# Patient Record
Sex: Male | Born: 1935 | Race: White | Hispanic: No | Marital: Married | Smoking: Never smoker
Health system: Southern US, Community
[De-identification: ages and names within clinical notes are randomized; demographics above are authoritative.]

## PROBLEM LIST (undated history)

## (undated) DIAGNOSIS — M810 Age-related osteoporosis without current pathological fracture: Secondary | ICD-10-CM

## (undated) DIAGNOSIS — H409 Unspecified glaucoma: Secondary | ICD-10-CM

## (undated) DIAGNOSIS — N400 Enlarged prostate without lower urinary tract symptoms: Secondary | ICD-10-CM

## (undated) DIAGNOSIS — C4491 Basal cell carcinoma of skin, unspecified: Secondary | ICD-10-CM

## (undated) DIAGNOSIS — G20A1 Parkinson's disease without dyskinesia, without mention of fluctuations: Secondary | ICD-10-CM

## (undated) DIAGNOSIS — G20 Parkinson's disease: Secondary | ICD-10-CM

## (undated) HISTORY — PX: NO PAST SURGERIES: SHX2092

---

## 2006-02-23 ENCOUNTER — Ambulatory Visit: Payer: Self-pay | Admitting: Family Medicine

## 2006-02-25 ENCOUNTER — Ambulatory Visit: Payer: Self-pay | Admitting: Family Medicine

## 2006-10-01 ENCOUNTER — Ambulatory Visit: Payer: Self-pay | Admitting: Gastroenterology

## 2008-11-27 ENCOUNTER — Ambulatory Visit: Payer: Self-pay | Admitting: Family Medicine

## 2009-11-22 ENCOUNTER — Encounter: Payer: Self-pay | Admitting: Internal Medicine

## 2009-12-14 ENCOUNTER — Encounter: Payer: Self-pay | Admitting: Internal Medicine

## 2010-09-03 ENCOUNTER — Ambulatory Visit: Payer: Self-pay | Admitting: Gastroenterology

## 2011-04-04 ENCOUNTER — Ambulatory Visit: Payer: Self-pay | Admitting: Internal Medicine

## 2011-04-09 ENCOUNTER — Ambulatory Visit: Payer: Self-pay | Admitting: Family Medicine

## 2011-10-08 ENCOUNTER — Ambulatory Visit: Payer: Self-pay | Admitting: Internal Medicine

## 2011-10-08 LAB — APTT: Activated PTT: 33.8 secs (ref 23.6–35.9)

## 2011-10-08 LAB — CBC WITH DIFFERENTIAL/PLATELET
Basophil #: 0 10*3/uL (ref 0.0–0.1)
Eosinophil #: 0.2 10*3/uL (ref 0.0–0.7)
Eosinophil %: 3.3 %
HCT: 37.9 % — ABNORMAL LOW (ref 40.0–52.0)
Lymphocyte %: 18.2 %
MCH: 31.4 pg (ref 26.0–34.0)
MCV: 94 fL (ref 80–100)
Monocyte #: 0.6 x10 3/mm (ref 0.2–1.0)
Neutrophil %: 67.4 %
RBC: 4.04 10*6/uL — ABNORMAL LOW (ref 4.40–5.90)
WBC: 5.7 10*3/uL (ref 3.8–10.6)

## 2011-10-08 LAB — URINALYSIS, COMPLETE
Bilirubin,UR: NEGATIVE
Glucose,UR: NEGATIVE mg/dL (ref 0–75)
Nitrite: POSITIVE

## 2011-10-08 LAB — COMPREHENSIVE METABOLIC PANEL
Anion Gap: 5 — ABNORMAL LOW (ref 7–16)
Bilirubin,Total: 0.8 mg/dL (ref 0.2–1.0)
Co2: 32 mmol/L (ref 21–32)
EGFR (African American): >60
EGFR (Non-African Amer.): >60
Osmolality: 283 (ref 275–301)
SGOT(AST): 26 U/L (ref 15–37)
SGPT (ALT): 26 U/L
Total Protein: 7.1 g/dL (ref 6.4–8.2)

## 2011-10-08 LAB — PROTIME-INR
INR: 1
Prothrombin Time: 13.2 secs (ref 11.5–14.7)

## 2011-10-08 LAB — SEDIMENTATION RATE: Erythrocyte Sed Rate: 16 mm/hr (ref 0–20)

## 2011-10-10 LAB — URINE CULTURE

## 2013-08-18 ENCOUNTER — Ambulatory Visit: Payer: Self-pay | Admitting: Family Medicine

## 2014-01-02 DIAGNOSIS — N400 Enlarged prostate without lower urinary tract symptoms: Secondary | ICD-10-CM | POA: Insufficient documentation

## 2014-01-02 DIAGNOSIS — M810 Age-related osteoporosis without current pathological fracture: Secondary | ICD-10-CM | POA: Insufficient documentation

## 2014-01-02 DIAGNOSIS — I10 Essential (primary) hypertension: Secondary | ICD-10-CM | POA: Insufficient documentation

## 2014-01-02 DIAGNOSIS — E876 Hypokalemia: Secondary | ICD-10-CM | POA: Insufficient documentation

## 2014-06-29 DIAGNOSIS — I10 Essential (primary) hypertension: Secondary | ICD-10-CM | POA: Diagnosis not present

## 2014-06-29 DIAGNOSIS — M810 Age-related osteoporosis without current pathological fracture: Secondary | ICD-10-CM | POA: Diagnosis not present

## 2014-06-29 DIAGNOSIS — Z79899 Other long term (current) drug therapy: Secondary | ICD-10-CM | POA: Diagnosis not present

## 2014-07-06 DIAGNOSIS — I10 Essential (primary) hypertension: Secondary | ICD-10-CM | POA: Diagnosis not present

## 2014-07-06 DIAGNOSIS — Z79899 Other long term (current) drug therapy: Secondary | ICD-10-CM | POA: Diagnosis not present

## 2014-07-06 DIAGNOSIS — Z0000 Encounter for general adult medical examination without abnormal findings: Secondary | ICD-10-CM | POA: Diagnosis not present

## 2014-08-01 DIAGNOSIS — H4011X1 Primary open-angle glaucoma, mild stage: Secondary | ICD-10-CM | POA: Diagnosis not present

## 2014-08-08 DIAGNOSIS — I10 Essential (primary) hypertension: Secondary | ICD-10-CM | POA: Diagnosis not present

## 2014-08-11 DIAGNOSIS — Z85820 Personal history of malignant melanoma of skin: Secondary | ICD-10-CM | POA: Diagnosis not present

## 2014-08-11 DIAGNOSIS — C44612 Basal cell carcinoma of skin of right upper limb, including shoulder: Secondary | ICD-10-CM | POA: Diagnosis not present

## 2014-08-11 DIAGNOSIS — D485 Neoplasm of uncertain behavior of skin: Secondary | ICD-10-CM | POA: Diagnosis not present

## 2014-08-11 DIAGNOSIS — L821 Other seborrheic keratosis: Secondary | ICD-10-CM | POA: Diagnosis not present

## 2014-08-21 DIAGNOSIS — G20 Parkinson's disease: Secondary | ICD-10-CM | POA: Diagnosis not present

## 2014-09-20 DIAGNOSIS — C44612 Basal cell carcinoma of skin of right upper limb, including shoulder: Secondary | ICD-10-CM | POA: Diagnosis not present

## 2014-09-21 DIAGNOSIS — C44612 Basal cell carcinoma of skin of right upper limb, including shoulder: Secondary | ICD-10-CM | POA: Diagnosis not present

## 2014-11-17 ENCOUNTER — Ambulatory Visit
Admission: EM | Admit: 2014-11-17 | Discharge: 2014-11-17 | Disposition: A | Discharge status: Home / Self Care | Payer: Commercial Managed Care - HMO | Attending: Family Medicine | Admitting: Family Medicine

## 2014-11-17 DIAGNOSIS — Y939 Activity, unspecified: Secondary | ICD-10-CM | POA: Diagnosis not present

## 2014-11-17 DIAGNOSIS — H409 Unspecified glaucoma: Secondary | ICD-10-CM | POA: Insufficient documentation

## 2014-11-17 DIAGNOSIS — L03115 Cellulitis of right lower limb: Secondary | ICD-10-CM | POA: Diagnosis not present

## 2014-11-17 DIAGNOSIS — L97911 Non-pressure chronic ulcer of unspecified part of right lower leg limited to breakdown of skin: Secondary | ICD-10-CM | POA: Diagnosis not present

## 2014-11-17 DIAGNOSIS — W57XXXA Bitten or stung by nonvenomous insect and other nonvenomous arthropods, initial encounter: Secondary | ICD-10-CM | POA: Diagnosis not present

## 2014-11-17 DIAGNOSIS — M810 Age-related osteoporosis without current pathological fracture: Secondary | ICD-10-CM | POA: Insufficient documentation

## 2014-11-17 DIAGNOSIS — N400 Enlarged prostate without lower urinary tract symptoms: Secondary | ICD-10-CM | POA: Diagnosis not present

## 2014-11-17 DIAGNOSIS — Z85828 Personal history of other malignant neoplasm of skin: Secondary | ICD-10-CM | POA: Insufficient documentation

## 2014-11-17 DIAGNOSIS — Z79899 Other long term (current) drug therapy: Secondary | ICD-10-CM | POA: Insufficient documentation

## 2014-11-17 DIAGNOSIS — S80861A Insect bite (nonvenomous), right lower leg, initial encounter: Secondary | ICD-10-CM | POA: Insufficient documentation

## 2014-11-17 HISTORY — DX: Unspecified glaucoma: H40.9

## 2014-11-17 HISTORY — DX: Age-related osteoporosis without current pathological fracture: M81.0

## 2014-11-17 HISTORY — DX: Basal cell carcinoma of skin, unspecified: C44.91

## 2014-11-17 HISTORY — DX: Benign prostatic hyperplasia without lower urinary tract symptoms: N40.0

## 2014-11-17 MED ORDER — SULFAMETHOXAZOLE-TRIMETHOPRIM 800-160 MG PO TABS: 1.0000 | ORAL_TABLET | Freq: Two times a day (BID) | ORAL | Status: DC

## 2014-11-17 NOTE — Discharge Instructions (Signed)

## 2014-11-17 NOTE — ED Notes (Signed)
One week ago thinks he had an insect bite.  Area was blistered. Patient has dressing over area and feels that it is infected

## 2014-11-17 NOTE — ED Provider Notes (Signed)
CSN: [id number]     Arrival date & time 11/17/14  1121 History   First MD Initiated Contact with Patient 11/17/14 1247     Chief Complaint  Patient presents with  . Insect Bite   (Consider location/radiation/quality/duration/timing/severity/associated sxs/prior Treatment) HPI Comments: 81 yo male with a 1 week h/o "insect bite" which "blistered" and drained. Since then has noticed a mild ulcer forming and slight drainage. Has been putting otc antibiotic ointment daily and bandage. Denies any fevers, chills, or pain.   The history is provided by the patient.    Past Medical History  Diagnosis Date  . Glaucoma (increased eye pressure)   . Prostate enlargement   . Osteoporosis   . Basal cell carcinoma    No past surgical history on file. No family history on file. History  Substance Use Topics  . Smoking status: Never Smoker   . Smokeless tobacco: Not on file  . Alcohol Use: No    Review of Systems  Allergies  Codeine  Home Medications   Prior to Admission medications   Medication Sig Start Date End Date Taking? Authorizing Provider  alendronate (FOSAMAX) 10 MG tablet Take 10 mg by mouth daily before breakfast. Take with a full glass of water on an empty stomach.   Yes Historical Provider, MD  carbidopa-levodopa (SINEMET IR) 25-100 MG per tablet Take 1 tablet by mouth 3 (three) times daily.   Yes Historical Provider, MD  finasteride (PROPECIA) 1 MG tablet Take 1 mg by mouth daily.   Yes Historical Provider, MD  latanoprost (XALATAN) 0.005 % ophthalmic solution 1 drop at bedtime.   Yes Historical Provider, MD  sulfamethoxazole-trimethoprim (BACTRIM DS,SEPTRA DS) 800-160 MG per tablet Take 1 tablet by mouth 2 (two) times daily. 11/17/14   Norval Gable, MD   BP 143/69 mmHg  Pulse 58  Temp(Src) 98.2 F (36.8 C) (Oral)  Resp 16  Ht 5\' 10"  (1.778 m)  Wt 165 lb (74.844 kg)  BMI 23.68 kg/m2  SpO2 98% Physical Exam  Constitutional: He appears well-developed and well-nourished.  No distress.  Skin: Skin is warm. He is not diaphoretic. There is erythema.     3cm superficial mildly draining skin ulceration to back of right lower leg (calf area) with surrounding warmth and blanchable erythema   Vitals reviewed.   ED Course  Procedures (including critical care time) Labs Review Labs Reviewed  CULTURE, ROUTINE-ABSCESS    Imaging Review No results found.   MDM   1. Skin ulcer of right lower leg, limited to breakdown of skin   2. Cellulitis of right lower extremity    New Prescriptions   SULFAMETHOXAZOLE-TRIMETHOPRIM (BACTRIM DS,SEPTRA DS) 800-160 MG PER TABLET    Take 1 tablet by mouth 2 (two) times daily.  Plan: 1. Diagnosis reviewed with patient 2. rx as per orders; risks, benefits, potential side effects reviewed with patient 3. Recommend supportive treatment with rest, elevation 4. F/u prn if symptoms worsen or don't improve    Norval Gable, MD 11/17/14 1320

## 2014-11-21 LAB — CULTURE, ROUTINE-ABSCESS: Special Requests: NORMAL

## 2015-01-04 DIAGNOSIS — Z79899 Other long term (current) drug therapy: Secondary | ICD-10-CM | POA: Diagnosis not present

## 2015-01-04 DIAGNOSIS — I10 Essential (primary) hypertension: Secondary | ICD-10-CM | POA: Diagnosis not present

## 2015-01-11 DIAGNOSIS — N401 Enlarged prostate with lower urinary tract symptoms: Secondary | ICD-10-CM | POA: Diagnosis not present

## 2015-01-11 DIAGNOSIS — G20 Parkinson's disease: Secondary | ICD-10-CM | POA: Diagnosis not present

## 2015-01-11 DIAGNOSIS — I10 Essential (primary) hypertension: Secondary | ICD-10-CM | POA: Diagnosis not present

## 2015-01-11 DIAGNOSIS — M810 Age-related osteoporosis without current pathological fracture: Secondary | ICD-10-CM | POA: Diagnosis not present

## 2015-02-09 ENCOUNTER — Other Ambulatory Visit: Payer: Self-pay | Admitting: Family Medicine

## 2015-02-09 DIAGNOSIS — R1011 Right upper quadrant pain: Secondary | ICD-10-CM | POA: Diagnosis not present

## 2015-02-09 DIAGNOSIS — Z85828 Personal history of other malignant neoplasm of skin: Secondary | ICD-10-CM | POA: Diagnosis not present

## 2015-02-09 DIAGNOSIS — Z85820 Personal history of malignant melanoma of skin: Secondary | ICD-10-CM | POA: Diagnosis not present

## 2015-02-09 DIAGNOSIS — L821 Other seborrheic keratosis: Secondary | ICD-10-CM | POA: Diagnosis not present

## 2015-02-09 DIAGNOSIS — D1801 Hemangioma of skin and subcutaneous tissue: Secondary | ICD-10-CM | POA: Diagnosis not present

## 2015-02-12 ENCOUNTER — Ambulatory Visit
Admission: RE | Admit: 2015-02-12 | Discharge: 2015-02-12 | Disposition: A | Discharge status: Home / Self Care | Payer: Commercial Managed Care - HMO | Source: Ambulatory Visit | Attending: Family Medicine | Admitting: Family Medicine

## 2015-02-12 DIAGNOSIS — R1011 Right upper quadrant pain: Secondary | ICD-10-CM

## 2015-04-20 DIAGNOSIS — H401131 Primary open-angle glaucoma, bilateral, mild stage: Secondary | ICD-10-CM | POA: Diagnosis not present

## 2015-05-29 DIAGNOSIS — B351 Tinea unguium: Secondary | ICD-10-CM | POA: Diagnosis not present

## 2015-05-29 DIAGNOSIS — L97521 Non-pressure chronic ulcer of other part of left foot limited to breakdown of skin: Secondary | ICD-10-CM | POA: Diagnosis not present

## 2015-05-29 DIAGNOSIS — M79672 Pain in left foot: Secondary | ICD-10-CM | POA: Diagnosis not present

## 2015-06-13 DIAGNOSIS — L97521 Non-pressure chronic ulcer of other part of left foot limited to breakdown of skin: Secondary | ICD-10-CM | POA: Diagnosis not present

## 2015-07-16 DIAGNOSIS — I10 Essential (primary) hypertension: Secondary | ICD-10-CM | POA: Diagnosis not present

## 2015-07-16 DIAGNOSIS — Z79899 Other long term (current) drug therapy: Secondary | ICD-10-CM | POA: Diagnosis not present

## 2015-07-18 DIAGNOSIS — L97521 Non-pressure chronic ulcer of other part of left foot limited to breakdown of skin: Secondary | ICD-10-CM | POA: Diagnosis not present

## 2015-07-23 DIAGNOSIS — Z79899 Other long term (current) drug therapy: Secondary | ICD-10-CM | POA: Diagnosis not present

## 2015-07-23 DIAGNOSIS — Z0000 Encounter for general adult medical examination without abnormal findings: Secondary | ICD-10-CM | POA: Diagnosis not present

## 2015-07-23 DIAGNOSIS — I10 Essential (primary) hypertension: Secondary | ICD-10-CM | POA: Diagnosis not present

## 2015-08-10 DIAGNOSIS — Z85820 Personal history of malignant melanoma of skin: Secondary | ICD-10-CM | POA: Diagnosis not present

## 2015-08-10 DIAGNOSIS — Z85828 Personal history of other malignant neoplasm of skin: Secondary | ICD-10-CM | POA: Diagnosis not present

## 2015-08-10 DIAGNOSIS — L570 Actinic keratosis: Secondary | ICD-10-CM | POA: Diagnosis not present

## 2015-08-10 DIAGNOSIS — R600 Localized edema: Secondary | ICD-10-CM | POA: Diagnosis not present

## 2015-08-10 DIAGNOSIS — X32XXXA Exposure to sunlight, initial encounter: Secondary | ICD-10-CM | POA: Diagnosis not present

## 2015-08-10 DIAGNOSIS — L821 Other seborrheic keratosis: Secondary | ICD-10-CM | POA: Diagnosis not present

## 2015-08-15 DIAGNOSIS — M79672 Pain in left foot: Secondary | ICD-10-CM | POA: Diagnosis not present

## 2015-08-15 DIAGNOSIS — B351 Tinea unguium: Secondary | ICD-10-CM | POA: Diagnosis not present

## 2015-08-15 DIAGNOSIS — L97521 Non-pressure chronic ulcer of other part of left foot limited to breakdown of skin: Secondary | ICD-10-CM | POA: Diagnosis not present

## 2015-08-21 DIAGNOSIS — F0280 Dementia in other diseases classified elsewhere without behavioral disturbance: Secondary | ICD-10-CM | POA: Diagnosis not present

## 2015-08-21 DIAGNOSIS — G20 Parkinson's disease: Secondary | ICD-10-CM | POA: Diagnosis not present

## 2015-09-03 DIAGNOSIS — F0280 Dementia in other diseases classified elsewhere without behavioral disturbance: Secondary | ICD-10-CM | POA: Insufficient documentation

## 2015-10-17 DIAGNOSIS — B351 Tinea unguium: Secondary | ICD-10-CM | POA: Diagnosis not present

## 2015-10-17 DIAGNOSIS — M79672 Pain in left foot: Secondary | ICD-10-CM | POA: Diagnosis not present

## 2015-10-17 DIAGNOSIS — H401131 Primary open-angle glaucoma, bilateral, mild stage: Secondary | ICD-10-CM | POA: Diagnosis not present

## 2015-10-17 DIAGNOSIS — L97521 Non-pressure chronic ulcer of other part of left foot limited to breakdown of skin: Secondary | ICD-10-CM | POA: Diagnosis not present

## 2015-10-19 DIAGNOSIS — H401131 Primary open-angle glaucoma, bilateral, mild stage: Secondary | ICD-10-CM | POA: Diagnosis not present

## 2015-10-29 DIAGNOSIS — R55 Syncope and collapse: Secondary | ICD-10-CM | POA: Diagnosis not present

## 2015-11-02 DIAGNOSIS — I499 Cardiac arrhythmia, unspecified: Secondary | ICD-10-CM | POA: Diagnosis not present

## 2015-11-02 DIAGNOSIS — Z79899 Other long term (current) drug therapy: Secondary | ICD-10-CM | POA: Diagnosis not present

## 2015-11-02 DIAGNOSIS — L02419 Cutaneous abscess of limb, unspecified: Secondary | ICD-10-CM | POA: Diagnosis not present

## 2015-11-02 DIAGNOSIS — L03119 Cellulitis of unspecified part of limb: Secondary | ICD-10-CM | POA: Diagnosis not present

## 2015-11-23 DIAGNOSIS — Z79899 Other long term (current) drug therapy: Secondary | ICD-10-CM | POA: Diagnosis not present

## 2015-11-30 DIAGNOSIS — R600 Localized edema: Secondary | ICD-10-CM | POA: Diagnosis not present

## 2015-11-30 DIAGNOSIS — Z79899 Other long term (current) drug therapy: Secondary | ICD-10-CM | POA: Diagnosis not present

## 2015-12-10 DIAGNOSIS — M79672 Pain in left foot: Secondary | ICD-10-CM | POA: Diagnosis not present

## 2015-12-10 DIAGNOSIS — L97521 Non-pressure chronic ulcer of other part of left foot limited to breakdown of skin: Secondary | ICD-10-CM | POA: Diagnosis not present

## 2016-01-14 DIAGNOSIS — I10 Essential (primary) hypertension: Secondary | ICD-10-CM | POA: Diagnosis not present

## 2016-01-14 DIAGNOSIS — Z79899 Other long term (current) drug therapy: Secondary | ICD-10-CM | POA: Diagnosis not present

## 2016-01-21 DIAGNOSIS — M542 Cervicalgia: Secondary | ICD-10-CM | POA: Diagnosis not present

## 2016-01-21 DIAGNOSIS — D649 Anemia, unspecified: Secondary | ICD-10-CM | POA: Diagnosis not present

## 2016-01-21 DIAGNOSIS — G20 Parkinson's disease: Secondary | ICD-10-CM | POA: Diagnosis not present

## 2016-01-21 DIAGNOSIS — I10 Essential (primary) hypertension: Secondary | ICD-10-CM | POA: Diagnosis not present

## 2016-01-21 DIAGNOSIS — Z79899 Other long term (current) drug therapy: Secondary | ICD-10-CM | POA: Diagnosis not present

## 2016-02-21 DIAGNOSIS — G20 Parkinson's disease: Secondary | ICD-10-CM | POA: Diagnosis not present

## 2016-02-21 DIAGNOSIS — F0280 Dementia in other diseases classified elsewhere without behavioral disturbance: Secondary | ICD-10-CM | POA: Diagnosis not present

## 2016-02-29 DIAGNOSIS — Z9181 History of falling: Secondary | ICD-10-CM | POA: Diagnosis not present

## 2016-02-29 DIAGNOSIS — R2681 Unsteadiness on feet: Secondary | ICD-10-CM | POA: Diagnosis not present

## 2016-02-29 DIAGNOSIS — R293 Abnormal posture: Secondary | ICD-10-CM | POA: Diagnosis not present

## 2016-02-29 DIAGNOSIS — G20 Parkinson's disease: Secondary | ICD-10-CM | POA: Diagnosis not present

## 2016-03-04 DIAGNOSIS — Z9181 History of falling: Secondary | ICD-10-CM | POA: Diagnosis not present

## 2016-03-04 DIAGNOSIS — G20 Parkinson's disease: Secondary | ICD-10-CM | POA: Diagnosis not present

## 2016-03-04 DIAGNOSIS — R2681 Unsteadiness on feet: Secondary | ICD-10-CM | POA: Diagnosis not present

## 2016-03-04 DIAGNOSIS — R293 Abnormal posture: Secondary | ICD-10-CM | POA: Diagnosis not present

## 2016-03-13 DIAGNOSIS — R293 Abnormal posture: Secondary | ICD-10-CM | POA: Diagnosis not present

## 2016-03-13 DIAGNOSIS — Z9181 History of falling: Secondary | ICD-10-CM | POA: Diagnosis not present

## 2016-03-13 DIAGNOSIS — G20 Parkinson's disease: Secondary | ICD-10-CM | POA: Diagnosis not present

## 2016-03-13 DIAGNOSIS — R2681 Unsteadiness on feet: Secondary | ICD-10-CM | POA: Diagnosis not present

## 2016-03-27 DIAGNOSIS — G20 Parkinson's disease: Secondary | ICD-10-CM | POA: Diagnosis not present

## 2016-03-27 DIAGNOSIS — R293 Abnormal posture: Secondary | ICD-10-CM | POA: Diagnosis not present

## 2016-03-27 DIAGNOSIS — R2681 Unsteadiness on feet: Secondary | ICD-10-CM | POA: Diagnosis not present

## 2016-03-27 DIAGNOSIS — Z9181 History of falling: Secondary | ICD-10-CM | POA: Diagnosis not present

## 2016-04-04 DIAGNOSIS — M79672 Pain in left foot: Secondary | ICD-10-CM | POA: Diagnosis not present

## 2016-04-04 DIAGNOSIS — L97521 Non-pressure chronic ulcer of other part of left foot limited to breakdown of skin: Secondary | ICD-10-CM | POA: Diagnosis not present

## 2016-04-10 DIAGNOSIS — R293 Abnormal posture: Secondary | ICD-10-CM | POA: Diagnosis not present

## 2016-04-10 DIAGNOSIS — Z9181 History of falling: Secondary | ICD-10-CM | POA: Diagnosis not present

## 2016-04-10 DIAGNOSIS — G20 Parkinson's disease: Secondary | ICD-10-CM | POA: Diagnosis not present

## 2016-04-10 DIAGNOSIS — R2681 Unsteadiness on feet: Secondary | ICD-10-CM | POA: Diagnosis not present

## 2016-04-14 DIAGNOSIS — Z9181 History of falling: Secondary | ICD-10-CM | POA: Diagnosis not present

## 2016-04-14 DIAGNOSIS — R293 Abnormal posture: Secondary | ICD-10-CM | POA: Diagnosis not present

## 2016-04-14 DIAGNOSIS — G20 Parkinson's disease: Secondary | ICD-10-CM | POA: Diagnosis not present

## 2016-04-14 DIAGNOSIS — R2681 Unsteadiness on feet: Secondary | ICD-10-CM | POA: Diagnosis not present

## 2016-04-17 DIAGNOSIS — X32XXXA Exposure to sunlight, initial encounter: Secondary | ICD-10-CM | POA: Diagnosis not present

## 2016-04-17 DIAGNOSIS — S90121A Contusion of right lesser toe(s) without damage to nail, initial encounter: Secondary | ICD-10-CM | POA: Diagnosis not present

## 2016-04-17 DIAGNOSIS — L821 Other seborrheic keratosis: Secondary | ICD-10-CM | POA: Diagnosis not present

## 2016-04-17 DIAGNOSIS — Z85828 Personal history of other malignant neoplasm of skin: Secondary | ICD-10-CM | POA: Diagnosis not present

## 2016-04-17 DIAGNOSIS — Z85820 Personal history of malignant melanoma of skin: Secondary | ICD-10-CM | POA: Diagnosis not present

## 2016-04-17 DIAGNOSIS — L570 Actinic keratosis: Secondary | ICD-10-CM | POA: Diagnosis not present

## 2016-05-05 DIAGNOSIS — R293 Abnormal posture: Secondary | ICD-10-CM | POA: Diagnosis not present

## 2016-05-05 DIAGNOSIS — Z9181 History of falling: Secondary | ICD-10-CM | POA: Diagnosis not present

## 2016-05-05 DIAGNOSIS — R2681 Unsteadiness on feet: Secondary | ICD-10-CM | POA: Diagnosis not present

## 2016-05-05 DIAGNOSIS — G20 Parkinson's disease: Secondary | ICD-10-CM | POA: Diagnosis not present

## 2016-05-28 DIAGNOSIS — H401131 Primary open-angle glaucoma, bilateral, mild stage: Secondary | ICD-10-CM | POA: Diagnosis not present

## 2016-06-28 DIAGNOSIS — R05 Cough: Secondary | ICD-10-CM | POA: Diagnosis not present

## 2016-06-30 DIAGNOSIS — J181 Lobar pneumonia, unspecified organism: Secondary | ICD-10-CM | POA: Diagnosis not present

## 2016-07-17 DIAGNOSIS — Z79899 Other long term (current) drug therapy: Secondary | ICD-10-CM | POA: Diagnosis not present

## 2016-07-17 DIAGNOSIS — D649 Anemia, unspecified: Secondary | ICD-10-CM | POA: Diagnosis not present

## 2016-07-24 DIAGNOSIS — Z0000 Encounter for general adult medical examination without abnormal findings: Secondary | ICD-10-CM | POA: Diagnosis not present

## 2016-08-20 DIAGNOSIS — G20 Parkinson's disease: Secondary | ICD-10-CM | POA: Diagnosis not present

## 2016-08-20 DIAGNOSIS — F0280 Dementia in other diseases classified elsewhere without behavioral disturbance: Secondary | ICD-10-CM | POA: Diagnosis not present

## 2016-10-29 DIAGNOSIS — H401131 Primary open-angle glaucoma, bilateral, mild stage: Secondary | ICD-10-CM | POA: Diagnosis not present

## 2016-11-05 DIAGNOSIS — H401131 Primary open-angle glaucoma, bilateral, mild stage: Secondary | ICD-10-CM | POA: Diagnosis not present

## 2017-01-16 DIAGNOSIS — Z79899 Other long term (current) drug therapy: Secondary | ICD-10-CM | POA: Diagnosis not present

## 2017-01-23 DIAGNOSIS — D649 Anemia, unspecified: Secondary | ICD-10-CM | POA: Diagnosis not present

## 2017-01-23 DIAGNOSIS — G20 Parkinson's disease: Secondary | ICD-10-CM | POA: Diagnosis not present

## 2017-01-23 DIAGNOSIS — Z79899 Other long term (current) drug therapy: Secondary | ICD-10-CM | POA: Diagnosis not present

## 2017-01-23 DIAGNOSIS — I10 Essential (primary) hypertension: Secondary | ICD-10-CM | POA: Diagnosis not present

## 2017-01-23 DIAGNOSIS — R2681 Unsteadiness on feet: Secondary | ICD-10-CM | POA: Diagnosis not present

## 2017-01-23 DIAGNOSIS — R351 Nocturia: Secondary | ICD-10-CM | POA: Diagnosis not present

## 2017-01-23 DIAGNOSIS — N401 Enlarged prostate with lower urinary tract symptoms: Secondary | ICD-10-CM | POA: Diagnosis not present

## 2017-02-20 DIAGNOSIS — R350 Frequency of micturition: Secondary | ICD-10-CM | POA: Diagnosis not present

## 2017-02-20 DIAGNOSIS — R2689 Other abnormalities of gait and mobility: Secondary | ICD-10-CM | POA: Diagnosis not present

## 2017-02-20 DIAGNOSIS — G20 Parkinson's disease: Secondary | ICD-10-CM | POA: Diagnosis not present

## 2017-03-05 DIAGNOSIS — H401111 Primary open-angle glaucoma, right eye, mild stage: Secondary | ICD-10-CM | POA: Diagnosis not present

## 2017-04-22 DIAGNOSIS — Z85828 Personal history of other malignant neoplasm of skin: Secondary | ICD-10-CM | POA: Diagnosis not present

## 2017-04-22 DIAGNOSIS — D225 Melanocytic nevi of trunk: Secondary | ICD-10-CM | POA: Diagnosis not present

## 2017-04-22 DIAGNOSIS — I872 Venous insufficiency (chronic) (peripheral): Secondary | ICD-10-CM | POA: Diagnosis not present

## 2017-04-22 DIAGNOSIS — Z85820 Personal history of malignant melanoma of skin: Secondary | ICD-10-CM | POA: Diagnosis not present

## 2017-06-23 DIAGNOSIS — R2689 Other abnormalities of gait and mobility: Secondary | ICD-10-CM | POA: Diagnosis not present

## 2017-06-23 DIAGNOSIS — I83893 Varicose veins of bilateral lower extremities with other complications: Secondary | ICD-10-CM | POA: Diagnosis not present

## 2017-06-23 DIAGNOSIS — G20 Parkinson's disease: Secondary | ICD-10-CM | POA: Diagnosis not present

## 2017-07-03 DIAGNOSIS — H401111 Primary open-angle glaucoma, right eye, mild stage: Secondary | ICD-10-CM | POA: Diagnosis not present

## 2017-07-20 DIAGNOSIS — I10 Essential (primary) hypertension: Secondary | ICD-10-CM | POA: Diagnosis not present

## 2017-07-20 DIAGNOSIS — Z79899 Other long term (current) drug therapy: Secondary | ICD-10-CM | POA: Diagnosis not present

## 2017-07-20 DIAGNOSIS — D649 Anemia, unspecified: Secondary | ICD-10-CM | POA: Diagnosis not present

## 2017-07-27 DIAGNOSIS — Z0000 Encounter for general adult medical examination without abnormal findings: Secondary | ICD-10-CM | POA: Diagnosis not present

## 2017-09-15 DIAGNOSIS — M25472 Effusion, left ankle: Secondary | ICD-10-CM | POA: Diagnosis not present

## 2017-09-15 DIAGNOSIS — I872 Venous insufficiency (chronic) (peripheral): Secondary | ICD-10-CM | POA: Diagnosis not present

## 2017-09-15 DIAGNOSIS — M25572 Pain in left ankle and joints of left foot: Secondary | ICD-10-CM | POA: Diagnosis not present

## 2017-11-02 DIAGNOSIS — H401111 Primary open-angle glaucoma, right eye, mild stage: Secondary | ICD-10-CM | POA: Diagnosis not present

## 2017-11-06 DIAGNOSIS — H401122 Primary open-angle glaucoma, left eye, moderate stage: Secondary | ICD-10-CM | POA: Diagnosis not present

## 2017-12-08 DIAGNOSIS — H401123 Primary open-angle glaucoma, left eye, severe stage: Secondary | ICD-10-CM | POA: Diagnosis not present

## 2017-12-22 DIAGNOSIS — R350 Frequency of micturition: Secondary | ICD-10-CM | POA: Diagnosis not present

## 2017-12-22 DIAGNOSIS — G20 Parkinson's disease: Secondary | ICD-10-CM | POA: Diagnosis not present

## 2017-12-22 DIAGNOSIS — R2689 Other abnormalities of gait and mobility: Secondary | ICD-10-CM | POA: Diagnosis not present

## 2017-12-22 DIAGNOSIS — F0280 Dementia in other diseases classified elsewhere without behavioral disturbance: Secondary | ICD-10-CM | POA: Diagnosis not present

## 2018-01-14 DIAGNOSIS — Z79899 Other long term (current) drug therapy: Secondary | ICD-10-CM | POA: Diagnosis not present

## 2018-01-25 DIAGNOSIS — N401 Enlarged prostate with lower urinary tract symptoms: Secondary | ICD-10-CM | POA: Diagnosis not present

## 2018-01-25 DIAGNOSIS — G20 Parkinson's disease: Secondary | ICD-10-CM | POA: Diagnosis not present

## 2018-01-25 DIAGNOSIS — D649 Anemia, unspecified: Secondary | ICD-10-CM | POA: Diagnosis not present

## 2018-01-25 DIAGNOSIS — R351 Nocturia: Secondary | ICD-10-CM | POA: Diagnosis not present

## 2018-01-25 DIAGNOSIS — Z79899 Other long term (current) drug therapy: Secondary | ICD-10-CM | POA: Diagnosis not present

## 2018-01-25 DIAGNOSIS — I872 Venous insufficiency (chronic) (peripheral): Secondary | ICD-10-CM | POA: Diagnosis not present

## 2018-02-26 ENCOUNTER — Other Ambulatory Visit: Payer: Self-pay

## 2018-02-26 ENCOUNTER — Emergency Department: Payer: Medicare HMO

## 2018-02-26 ENCOUNTER — Emergency Department
Admission: EM | Admit: 2018-02-26 | Discharge: 2018-02-26 | Disposition: A | Discharge status: Home / Self Care | Payer: Medicare HMO | Attending: Emergency Medicine | Admitting: Emergency Medicine

## 2018-02-26 ENCOUNTER — Encounter: Payer: Self-pay | Admitting: Emergency Medicine

## 2018-02-26 DIAGNOSIS — Y929 Unspecified place or not applicable: Secondary | ICD-10-CM | POA: Diagnosis not present

## 2018-02-26 DIAGNOSIS — Y999 Unspecified external cause status: Secondary | ICD-10-CM | POA: Diagnosis not present

## 2018-02-26 DIAGNOSIS — Y9389 Activity, other specified: Secondary | ICD-10-CM | POA: Insufficient documentation

## 2018-02-26 DIAGNOSIS — W0110XA Fall on same level from slipping, tripping and stumbling with subsequent striking against unspecified object, initial encounter: Secondary | ICD-10-CM | POA: Insufficient documentation

## 2018-02-26 DIAGNOSIS — M545 Low back pain, unspecified: Secondary | ICD-10-CM

## 2018-02-26 DIAGNOSIS — S0990XA Unspecified injury of head, initial encounter: Secondary | ICD-10-CM

## 2018-02-26 DIAGNOSIS — S0003XA Contusion of scalp, initial encounter: Secondary | ICD-10-CM | POA: Diagnosis not present

## 2018-02-26 DIAGNOSIS — S3992XA Unspecified injury of lower back, initial encounter: Secondary | ICD-10-CM | POA: Diagnosis not present

## 2018-02-26 DIAGNOSIS — R51 Headache: Secondary | ICD-10-CM | POA: Diagnosis not present

## 2018-02-26 DIAGNOSIS — Z79899 Other long term (current) drug therapy: Secondary | ICD-10-CM | POA: Diagnosis not present

## 2018-02-26 DIAGNOSIS — S34139A Unspecified injury to sacral spinal cord, initial encounter: Secondary | ICD-10-CM | POA: Diagnosis not present

## 2018-02-26 MED ORDER — DICLOFENAC SODIUM 1 % TD GEL: 4.0000 g | Freq: Four times a day (QID) | TRANSDERMAL | 1 refills | Status: DC | PRN

## 2018-02-26 MED ORDER — ACETAMINOPHEN 325 MG PO TABS
650.0000 mg | ORAL_TABLET | Freq: Once | ORAL | Status: AC
  Administered 2018-02-26: 650 mg via ORAL
  Filled 2018-02-26: qty 2

## 2018-02-26 NOTE — ED Triage Notes (Signed)
First Nurse Note:  C/O fall this morning.  Here for evaluation of head injury.  No LOC.

## 2018-02-26 NOTE — ED Triage Notes (Signed)
Mechanical fall, tripped and fell hitting head this morning.

## 2018-02-26 NOTE — Discharge Instructions (Signed)
Please use the Voltaren gel on the extremities or buttocks 4 times per day if needed for pain. Follow up with your primary care provider  or the neurologist for symptoms that are not improving over the next few days. Return to the ER for symptoms that change or worsen if unable to schedule an appointment.

## 2018-02-26 NOTE — ED Notes (Signed)
See triage note  States he was backing out of the bathroom  Lost his balance  [address] back  Landed on tailbone

## 2018-02-26 NOTE — ED Provider Notes (Signed)
[facility] Emergency Department Provider Note  ____________________________________________   None    (approximate)  I have reviewed the triage vital signs and the nursing notes.   HISTORY  Chief Complaint Fall   HPI Gavin Garcia is a 84 y.o. male who presents to the emergency department for treatment and evaluation after a mechanical, non-syncopal fall at about 6 am. He was backing out of the bathroom and lost his balance. He fell directly on his tailbone then struck his head on vinyl floor. No loss of consciousness. Fall was witnessed by his wife. He has pain in his tailbone and a red area on the back of his head.    Past Medical History:  Diagnosis Date  . Basal cell carcinoma   . Glaucoma (increased eye pressure)   . Osteoporosis   . Prostate enlargement     There are no active problems to display for this patient.   History reviewed. No pertinent surgical history.  Prior to Admission medications   Medication Sig Start Date End Date Taking? Authorizing Provider  alendronate (FOSAMAX) 10 MG tablet Take 10 mg by mouth daily before breakfast. Take with a full glass of water on an empty stomach.    [provider]  carbidopa-levodopa (SINEMET IR) 25-100 MG per tablet Take 1 tablet by mouth 3 (three) times daily.    [provider]  diclofenac sodium (VOLTAREN) 1 % GEL Apply 4 g topically 4 (four) times daily as needed. 02/26/18   Decklan Mau, Johnette Abraham B, FNP  finasteride (PROPECIA) 1 MG tablet Take 1 mg by mouth daily.    [provider]  latanoprost (XALATAN) 0.005 % ophthalmic solution 1 drop at bedtime.    [provider]  sulfamethoxazole-trimethoprim (BACTRIM DS,SEPTRA DS) 800-160 MG per tablet Take 1 tablet by mouth 2 (two) times daily. 11/17/14   Norval Gable, MD    Allergies Codeine  No family history on file.  Social History Social History   Tobacco Use  . Smoking status: Never Smoker  . Smokeless  tobacco: Never Used  Substance Use Topics  . Alcohol use: No  . Drug use: Not on file    Review of Systems  Constitutional: No fever/chills Eyes: No visual changes. ENT: No sore throat. Cardiovascular: Denies chest pain. Respiratory: Denies shortness of breath. Gastrointestinal: No abdominal pain.  No nausea, no vomiting.  No diarrhea.  No constipation. Genitourinary: Negative for dysuria. Musculoskeletal: Positive for coccyx pain and bilateral lower extremity pain. Skin: Positive for multiple ecchymotic areas. Neurological: Positive for headaches, negative focal weakness or numbness. ____________________________________________   PHYSICAL EXAM:  VITAL SIGNS: ED Triage Vitals  Enc Vitals Group     BP 02/26/18 1046 140/61     Pulse Rate 02/26/18 1044 71     Resp 02/26/18 1044 (!) 23     Temp 02/26/18 1044 98.1 F (36.7 C)     Temp Source 02/26/18 1044 Oral     SpO2 02/26/18 1044 97 %     Weight 02/26/18 1046 185 lb (83.9 kg)     Height 02/26/18 1046 5\' 8"  (1.727 m)     Head Circumference --      Peak Flow --      Pain Score 02/26/18 1056 0     Pain Loc --      Pain Edu? --      Excl. in [place]? --     Constitutional: Alert and oriented. Well appearing and in no acute distress. Eyes: Conjunctivae  are normal. PERRL. Head: Hematoma to the right parietal scalp on the left. Nose: No congestion/rhinnorhea. Mouth/Throat: Mucous membranes are moist.  Oropharynx non-erythematous. Neck: No stridor.   Cardiovascular: Normal rate, regular rhythm. Grossly normal heart sounds.  Good peripheral circulation. Respiratory: Normal respiratory effort.  No retractions. Lungs CTAB. Gastrointestinal: Soft and nontender. No distention. No abdominal bruits. No CVA tenderness. Musculoskeletal: No lower extremity tenderness nor edema.  No joint effusions. Neurologic:  Normal speech and language. No gross focal neurologic deficits are appreciated. No gait instability. Skin:  Skin is warm, dry  and intact. Scattered ecchymosis over upper extremities. Psychiatric: Mood and affect are normal. Speech and behavior are normal.  ____________________________________________   LABS (all labs ordered are listed, but only abnormal results are displayed)  Labs Reviewed - No data to display ____________________________________________  EKG  Not indicated.  ____________________________________________  RADIOLOGY  ED MD interpretation:  Head CT, Sacrum/Coccyx/Lumbar spine images all reassuring per radiology.  Official radiology report(s): Dg Lumbar Spine 2-3 Views  Result Date: 02/26/2018 CLINICAL DATA:  Fall landing on tailbone. EXAM: LUMBAR SPINE - 2-3 VIEW COMPARISON:  None. FINDINGS: Degenerative spondylitic changes throughout the thoracolumbar spine, at least moderate in degree with associated disc space narrowings, endplate spurring and degenerative facet hypertrophy. Chronic appearing compression deformities at the L3 through L5 vertebral body levels. No acute appearing osseous abnormality. Visualized paravertebral soft tissues are unremarkable. IMPRESSION: 1. No acute appearing osseous abnormality. 2. Degenerative spondylitic changes throughout the thoracolumbar spine, at least moderate in degree, as detailed above. Chronic appearing compression deformities at the superior endplates of the L3 through L5 vertebral bodies. Electronically Signed   By: Franki Cabot M.D.   On: 02/26/2018 13:08   Dg Sacrum/coccyx  Result Date: 02/26/2018 CLINICAL DATA:  Fall on tailbone. EXAM: SACRUM AND COCCYX - 2+ VIEW COMPARISON:  None. FINDINGS: There is no evidence of fracture or other focal bone lesions. Degenerative changes within the lower lumbar spine, at least moderate in degree, incompletely imaged. IMPRESSION: No acute findings.  No osseous fracture or dislocation. Electronically Signed   By: Franki Cabot M.D.   On: 02/26/2018 13:03   Ct Head Wo Contrast  Result Date: 02/26/2018 CLINICAL  DATA:  Pain following fall EXAM: CT HEAD WITHOUT CONTRAST TECHNIQUE: Contiguous axial images were obtained from the base of the skull through the vertex without intravenous contrast. COMPARISON:  None. FINDINGS: Brain: There is mild diffuse atrophy. There is no intracranial mass, hemorrhage, extra-axial fluid collection, or midline shift. There is small vessel disease throughout much of the centra semiovale bilaterally. Elsewhere gray-white compartments appear normal. No acute infarct evident. Vascular: No evident hyperdense vessel. There are foci of calcification in each cavernous carotid artery. Skull: Bony calvarium appears intact. Sinuses/Orbits: There is mild mucosal thickening in the maxillary antra bilaterally. There is mucosal thickening in several ethmoid air cells. Other visualized paranasal sinuses are clear. Orbits appear symmetric bilaterally. Other: Mastoid air cells are clear. IMPRESSION: Mild atrophy with supratentorial small vessel disease. No acute infarct evident. No mass or hemorrhage. Mild arterial vascular calcification bilaterally. There is mild paranasal sinus disease. Electronically Signed   By: Lowella Grip III M.D.   On: 02/26/2018 12:45    ____________________________________________   PROCEDURES  Procedure(s) performed: None  Procedures  Critical Care performed: No  ____________________________________________   INITIAL IMPRESSION / ASSESSMENT AND PLAN / ED COURSE  As part of my medical decision making, I reviewed the following data within the electronic MEDICAL RECORD NUMBER    84 year old  male presenting to the emergency department after a mechanical, non-syncopal fall this morning.  CT of the head shows no acute findings.  X-ray images of the lumbar spine and coccyx are reassuring. Patient to be discharged home. He will use Voltaren gel and tylenol if needed for pain. He is to follow up with his PCP for symptoms of concern or return to the ER.       ____________________________________________   FINAL CLINICAL IMPRESSION(S) / ED DIAGNOSES  Final diagnoses:  Acute lumbar back pain  Minor head injury, initial encounter  Scalp hematoma, initial encounter  Injury of coccyx, initial encounter     ED Discharge Orders         Ordered    diclofenac sodium (VOLTAREN) 1 % GEL  4 times daily PRN     02/26/18 1349           Note:  This document was prepared using Dragon voice recognition software and may include unintentional dictation errors.    Victorino Dike, FNP 02/26/18 1533    Harvest Dark, MD 02/28/18 1801

## 2018-03-07 ENCOUNTER — Other Ambulatory Visit: Payer: Self-pay

## 2018-03-07 ENCOUNTER — Observation Stay
Admission: EM | Admit: 2018-03-07 | Discharge: 2018-03-11 | Disposition: A | Discharge status: Home / Self Care | Payer: Medicare HMO | Attending: Specialist | Admitting: Specialist

## 2018-03-07 DIAGNOSIS — Z79899 Other long term (current) drug therapy: Secondary | ICD-10-CM | POA: Diagnosis not present

## 2018-03-07 DIAGNOSIS — R29898 Other symptoms and signs involving the musculoskeletal system: Secondary | ICD-10-CM | POA: Diagnosis present

## 2018-03-07 DIAGNOSIS — M47816 Spondylosis without myelopathy or radiculopathy, lumbar region: Secondary | ICD-10-CM | POA: Diagnosis not present

## 2018-03-07 DIAGNOSIS — G20 Parkinson's disease: Secondary | ICD-10-CM | POA: Insufficient documentation

## 2018-03-07 DIAGNOSIS — E860 Dehydration: Secondary | ICD-10-CM | POA: Insufficient documentation

## 2018-03-07 DIAGNOSIS — G319 Degenerative disease of nervous system, unspecified: Secondary | ICD-10-CM | POA: Insufficient documentation

## 2018-03-07 DIAGNOSIS — Z8249 Family history of ischemic heart disease and other diseases of the circulatory system: Secondary | ICD-10-CM | POA: Insufficient documentation

## 2018-03-07 DIAGNOSIS — S3210XA Unspecified fracture of sacrum, initial encounter for closed fracture: Secondary | ICD-10-CM | POA: Insufficient documentation

## 2018-03-07 DIAGNOSIS — R262 Difficulty in walking, not elsewhere classified: Secondary | ICD-10-CM | POA: Insufficient documentation

## 2018-03-07 DIAGNOSIS — M5137 Other intervertebral disc degeneration, lumbosacral region: Secondary | ICD-10-CM | POA: Diagnosis not present

## 2018-03-07 DIAGNOSIS — M48061 Spinal stenosis, lumbar region without neurogenic claudication: Secondary | ICD-10-CM | POA: Diagnosis not present

## 2018-03-07 DIAGNOSIS — M810 Age-related osteoporosis without current pathological fracture: Secondary | ICD-10-CM | POA: Insufficient documentation

## 2018-03-07 DIAGNOSIS — K5730 Diverticulosis of large intestine without perforation or abscess without bleeding: Secondary | ICD-10-CM | POA: Diagnosis not present

## 2018-03-07 DIAGNOSIS — Z7982 Long term (current) use of aspirin: Secondary | ICD-10-CM | POA: Diagnosis not present

## 2018-03-07 DIAGNOSIS — Z885 Allergy status to narcotic agent status: Secondary | ICD-10-CM | POA: Insufficient documentation

## 2018-03-07 DIAGNOSIS — H5789 Other specified disorders of eye and adnexa: Secondary | ICD-10-CM | POA: Insufficient documentation

## 2018-03-07 DIAGNOSIS — N400 Enlarged prostate without lower urinary tract symptoms: Secondary | ICD-10-CM | POA: Insufficient documentation

## 2018-03-07 DIAGNOSIS — Z85828 Personal history of other malignant neoplasm of skin: Secondary | ICD-10-CM | POA: Insufficient documentation

## 2018-03-07 DIAGNOSIS — R531 Weakness: Secondary | ICD-10-CM | POA: Diagnosis not present

## 2018-03-07 DIAGNOSIS — W19XXXA Unspecified fall, initial encounter: Secondary | ICD-10-CM | POA: Insufficient documentation

## 2018-03-07 DIAGNOSIS — I6782 Cerebral ischemia: Secondary | ICD-10-CM | POA: Insufficient documentation

## 2018-03-07 DIAGNOSIS — M6281 Muscle weakness (generalized): Secondary | ICD-10-CM | POA: Diagnosis not present

## 2018-03-07 DIAGNOSIS — I700 Atherosclerosis of aorta: Secondary | ICD-10-CM | POA: Diagnosis not present

## 2018-03-07 DIAGNOSIS — H409 Unspecified glaucoma: Secondary | ICD-10-CM | POA: Insufficient documentation

## 2018-03-07 LAB — CBC
HEMATOCRIT: 38.8 % — AB (ref 40.0–52.0)
HEMOGLOBIN: 13.5 g/dL (ref 13.0–18.0)
MCH: 31.9 pg (ref 26.0–34.0)
MCHC: 34.8 g/dL (ref 32.0–36.0)
MCV: 91.6 fL (ref 80.0–100.0)
Platelets: 158 10*3/uL (ref 150–440)
RBC: 4.24 MIL/uL — ABNORMAL LOW (ref 4.40–5.90)
RDW: 14.4 % (ref 11.5–14.5)
WBC: 11.2 10*3/uL — ABNORMAL HIGH (ref 3.8–10.6)

## 2018-03-07 LAB — BASIC METABOLIC PANEL
Anion gap: 8 (ref 5–15)
BUN: 41 mg/dL — ABNORMAL HIGH (ref 8–23)
CALCIUM: 9.2 mg/dL (ref 8.9–10.3)
CHLORIDE: 106 mmol/L (ref 98–111)
CO2: 24 mmol/L (ref 22–32)
CREATININE: 0.8 mg/dL (ref 0.61–1.24)
GFR calc Af Amer: >60 mL/min (ref >60)
GFR calc non Af Amer: >60 mL/min (ref >60)
GLUCOSE: 121 mg/dL — AB (ref 70–99)
Potassium: 3.8 mmol/L (ref 3.5–5.1)
Sodium: 138 mmol/L (ref 135–145)

## 2018-03-07 NOTE — ED Triage Notes (Signed)
Pt arrrived via ems with weakness x2-3 days. Pt was unable to get up from toilet for about an hour due to weakness. Edema to bilateral feet has been occurring x1 day. Pt also states that it hurts to urinate. EMS vitals: 176/87, 80, and 94% RA. Pt NAD currently, respirations even and non labored.

## 2018-03-08 ENCOUNTER — Emergency Department: Payer: Medicare HMO

## 2018-03-08 ENCOUNTER — Observation Stay: Payer: Medicare HMO

## 2018-03-08 DIAGNOSIS — E860 Dehydration: Secondary | ICD-10-CM | POA: Diagnosis not present

## 2018-03-08 DIAGNOSIS — R29898 Other symptoms and signs involving the musculoskeletal system: Secondary | ICD-10-CM | POA: Diagnosis present

## 2018-03-08 DIAGNOSIS — N400 Enlarged prostate without lower urinary tract symptoms: Secondary | ICD-10-CM | POA: Diagnosis not present

## 2018-03-08 DIAGNOSIS — G20 Parkinson's disease: Secondary | ICD-10-CM | POA: Diagnosis not present

## 2018-03-08 DIAGNOSIS — R531 Weakness: Secondary | ICD-10-CM | POA: Diagnosis not present

## 2018-03-08 DIAGNOSIS — S3210XA Unspecified fracture of sacrum, initial encounter for closed fracture: Secondary | ICD-10-CM | POA: Diagnosis not present

## 2018-03-08 LAB — URINALYSIS, COMPLETE (UACMP) WITH MICROSCOPIC
Bacteria, UA: NONE SEEN
Bilirubin Urine: NEGATIVE
Glucose, UA: NEGATIVE mg/dL
HGB URINE DIPSTICK: NEGATIVE
KETONES UR: 20 mg/dL — AB
Leukocytes, UA: NEGATIVE
Nitrite: NEGATIVE
Protein, ur: NEGATIVE mg/dL
Specific Gravity, Urine: 1.024 (ref 1.005–1.030)
pH: 6 (ref 5.0–8.0)

## 2018-03-08 LAB — TSH: TSH: 2.321 u[IU]/mL (ref 0.350–4.500)

## 2018-03-08 MED ORDER — CARBIDOPA-LEVODOPA 25-250 MG PO TABS
1.0000 | ORAL_TABLET | Freq: Three times a day (TID) | ORAL | Status: DC
  Administered 2018-03-08 – 2018-03-11 (×10): 1 via ORAL
  Filled 2018-03-08 (×12): qty 1

## 2018-03-08 MED ORDER — POLYETHYLENE GLYCOL 3350 17 G PO PACK
17.0000 g | PACK | Freq: Every day | ORAL | Status: DC
  Administered 2018-03-08 – 2018-03-11 (×2): 17 g via ORAL
  Filled 2018-03-08 (×4): qty 1

## 2018-03-08 MED ORDER — ACETAMINOPHEN 325 MG PO TABS
650.0000 mg | ORAL_TABLET | Freq: Four times a day (QID) | ORAL | Status: DC | PRN
  Administered 2018-03-08 – 2018-03-11 (×7): 650 mg via ORAL
  Filled 2018-03-08 (×7): qty 2

## 2018-03-08 MED ORDER — POLYETHYLENE GLYCOL 3350 17 GM/SCOOP PO POWD
17.0000 g | Freq: Every day | ORAL | Status: DC
  Filled 2018-03-08: qty 255

## 2018-03-08 MED ORDER — NAPROXEN SODIUM 220 MG PO TABS
220.0000 mg | ORAL_TABLET | Freq: Every day | ORAL | Status: DC
Start: 2018-03-08 — End: 2018-03-08

## 2018-03-08 MED ORDER — ENOXAPARIN SODIUM 40 MG/0.4ML ~~LOC~~ SOLN
40.0000 mg | SUBCUTANEOUS | Status: DC
  Administered 2018-03-08 – 2018-03-10 (×3): 40 mg via SUBCUTANEOUS
  Filled 2018-03-08 (×3): qty 0.4

## 2018-03-08 MED ORDER — DORZOLAMIDE HCL-TIMOLOL MAL 2-0.5 % OP SOLN
1.0000 [drp] | Freq: Two times a day (BID) | OPHTHALMIC | Status: DC
  Administered 2018-03-08 – 2018-03-11 (×7): 1 [drp] via OPHTHALMIC
  Filled 2018-03-08: qty 10

## 2018-03-08 MED ORDER — DOXYCYCLINE HYCLATE 20 MG PO TABS: 20.0000 mg | ORAL_TABLET | Freq: Two times a day (BID) | ORAL | Status: DC

## 2018-03-08 MED ORDER — LATANOPROST 0.005 % OP SOLN
1.0000 [drp] | Freq: Every day | OPHTHALMIC | Status: DC
  Administered 2018-03-08 – 2018-03-10 (×3): 1 [drp] via OPHTHALMIC
  Filled 2018-03-08: qty 2.5

## 2018-03-08 MED ORDER — NAPROXEN 250 MG PO TABS
250.0000 mg | ORAL_TABLET | Freq: Every day | ORAL | Status: DC
  Administered 2018-03-08 – 2018-03-11 (×4): 250 mg via ORAL
  Filled 2018-03-08 (×4): qty 1

## 2018-03-08 MED ORDER — FUROSEMIDE 20 MG PO TABS: 20.0000 mg | ORAL_TABLET | Freq: Every day | ORAL | Status: DC | PRN

## 2018-03-08 MED ORDER — SODIUM CHLORIDE 0.9 % IV SOLN
INTRAVENOUS | Status: DC
  Administered 2018-03-08 (×2): via INTRAVENOUS

## 2018-03-08 MED ORDER — ASPIRIN EC 81 MG PO TBEC
81.0000 mg | DELAYED_RELEASE_TABLET | Freq: Every day | ORAL | Status: DC
  Administered 2018-03-08 – 2018-03-11 (×4): 81 mg via ORAL
  Filled 2018-03-08 (×4): qty 1

## 2018-03-08 MED ORDER — ACETAMINOPHEN 650 MG RE SUPP: 650.0000 mg | Freq: Four times a day (QID) | RECTAL | Status: DC | PRN

## 2018-03-08 MED ORDER — FINASTERIDE 5 MG PO TABS
5.0000 mg | ORAL_TABLET | Freq: Every day | ORAL | Status: DC
  Administered 2018-03-08 – 2018-03-11 (×4): 5 mg via ORAL
  Filled 2018-03-08 (×4): qty 1

## 2018-03-08 MED ORDER — DOCUSATE SODIUM 100 MG PO CAPS
100.0000 mg | ORAL_CAPSULE | Freq: Two times a day (BID) | ORAL | Status: DC
  Administered 2018-03-08 – 2018-03-11 (×7): 100 mg via ORAL
  Filled 2018-03-08 (×7): qty 1

## 2018-03-08 MED ORDER — ONDANSETRON HCL 4 MG PO TABS: 4.0000 mg | ORAL_TABLET | Freq: Four times a day (QID) | ORAL | Status: DC | PRN

## 2018-03-08 MED ORDER — ONDANSETRON HCL 4 MG/2ML IJ SOLN: 4.0000 mg | Freq: Four times a day (QID) | INTRAMUSCULAR | Status: DC | PRN

## 2018-03-08 NOTE — ED Notes (Signed)
Patient transported to CT 

## 2018-03-08 NOTE — Evaluation (Signed)
Occupational Therapy Evaluation Patient Details Name: Gavin Garcia MRN: [id number] DOB: [birth year] Today's Date: 03/08/2018    History of Present Illness Patient is an 84 year old male admitted with c/o of LE weakness s/p fall.  PMH includes enlarged prostate, PD, glaucoma, basal cell CA and osteoporosis.   Clinical Impression   Pt is 84 year old male who lives at the [place] with his wife.  He was independent in all ADLs and used a walker as needed as well as elastic shoe laces and a LH shoe horn to get dressed.  His wife did not need to assist him with any ADLs and meals are prepared at the facility which his wife usually picks up and brings back to their home.  He fell recently and since Sunday had a sudden onset of LE weakness per wife's report. Pt was receiving his medications from [facility] at the beginning of session and may have contributed to presentation of decreased mobility (Parkinson's med usually was taken at 10am). He presented with a R lean in bed with mod assist and cues needed to correct to midline.  Max assist for bed mobility and for LB dressing skills.  He requires extra time for processing and sequencing.  Grooming and UB dressing skills min assist with cues.  He is able to feed himself with set up but at this time, mobility to achieve a good upright position is difficult and effortful and at a level that his wife would not be able to assist with.   Rec continued OT while in hospital to continue to work on increasing independence in ADLs using assistive devices (reacher, sock aid and LH shoe horn), balance and functional mobility training, coordination exercises and family ed and training and SNF after discharge.  Discussed concerns about level of care currently needed with [place] from SW and PT shares same concerns.      Follow Up Recommendations  SNF    Equipment Recommendations  Other (comment)(rec a shower chair with a back, curently has a stool without a back,  sock aid)    Recommendations for Other Services       Precautions / Restrictions Precautions Precautions: Fall Precaution Comments: High Fall risk Restrictions Weight Bearing Restrictions: No      Mobility Bed Mobility Overal bed mobility: Needs Assistance Bed Mobility: Sit to Supine;Supine to Sit     Supine to sit: Max assist Sit to supine: Max assist   General bed mobility comments: Required hand held assist and assistance to bring LE's over EOB. PT and pt's daughter assisted pt to EOB and in returning to bed; PT and RN assisted pt with positioning using pillows.  Transfers Overall transfer level: Needs assistance Equipment used: Rolling walker (2 wheeled) Transfers: Sit to/from Stand Sit to Stand: +2 physical assistance;Max assist         General transfer comment: Assistance to rise from bed from PT and pt's daughter; required 3 attempts and bed elevated to stand.  Several VC's provided for upright posture and to avoid knee flexion.  Pt was eventually able to stand at his baseline posture.    Balance Overall balance assessment: Needs assistance Sitting-balance support: Bilateral upper extremity supported;Feet supported Sitting balance-Leahy Scale: Poor Sitting balance - Comments: Requires bilateral UE's to sit upright at EOB.  Can use trunk strength to correct but returns to posterior lean after 5-10 sec. Postural control: Posterior lean Standing balance support: Bilateral upper extremity supported Standing balance-Leahy Scale: Poor  ADL either performed or assessed with clinical judgement   ADL Overall ADL's : Needs assistance/impaired Eating/Feeding: Independent;Set up   Grooming: Wash/dry hands;Wash/dry face;Oral care;Set up;Minimal assistance;Cueing for sequencing;Bed level Grooming Details (indicate cue type and reason): Pt needing assist for sequencing and extra time for processing.           Upper Body Dressing :  Minimal assistance;Set up Upper Body Dressing Details (indicate cue type and reason): Pt needing assist to sit up and find neutral position in bed before being able to don hospital gown--did not work on overhead shirt or any buttons Lower Body Dressing: Maximal assistance;Set up;Cueing for sequencing;Bed level;+2 for physical assistance Lower Body Dressing Details (indicate cue type and reason): Pt requried cues and assist to move LEs in bed and to position trunk and unable to reach feet for simulated dressing task and needed max asssit, cues and extra time for bed level dressing skills which is significant change from independent.  Note:  Pt did not get meds for Parkinson's until right before assessment.               General ADL Comments: Pt's wife placed urinal while lying in bed for him to urinate with success.     Vision Baseline Vision/History: Wears glasses Wears Glasses: At all times Patient Visual Report: No change from baseline       Perception     Praxis      Pertinent Vitals/Pain Pain Assessment: Faces Faces Pain Scale: Hurts little more Pain Location: bilateral calves with active movement Pain Descriptors / Indicators: Aching;Discomfort Pain Intervention(s): Limited activity within patient's tolerance;Monitored during session;Other (comment)(RN gave all meds at beginning of session )     Hand Dominance Right   Extremity/Trunk Assessment Upper Extremity Assessment Upper Extremity Assessment: Overall WFL for tasks assessed   Lower Extremity Assessment Lower Extremity Assessment: Defer to PT evaluation   Cervical / Trunk Assessment Cervical / Trunk Assessment: Kyphotic   Communication Communication Communication: HOH   Cognition Arousal/Alertness: Awake/alert Behavior During Therapy: WFL for tasks assessed/performed Overall Cognitive Status: Within Functional Limits for tasks assessed                                 General Comments: Follows  commands consistently.   General Comments  Coordination: UE: finger to nose: BUE: 8 sec. with mild dysmetria.    Exercises Other Exercises Other Exercises: Assisted pt in positioning for avoidance of pressure ulcers and VC's for bed mobility.  x8 min   Shoulder Instructions      Home Living Family/patient expects to be discharged to:: Skilled nursing facility                                        Prior Functioning/Environment Level of Independence: Independent with assistive device(s)        Comments: Pt has been using RW more frequently for household ambulation since recent fall.  He was showering and dressing independently and had elastic shoe laces and LH shoe horn for LB dressing skills.  His wife cooked breakfast and lunch and dinner provided at the facility ([facility]).        OT Problem List: Pain;Decreased strength;Impaired balance (sitting and/or standing);Decreased activity tolerance;Decreased knowledge of use of DME or AE;Decreased cognition      OT Treatment/Interventions: Self-care/ADL training;DME  and/or AE instruction;Therapeutic activities;Patient/family education;Balance training    OT Goals(Current goals can be found in the care plan section) Acute Rehab OT Goals Patient Stated Goal: "to get my strength back again" OT Goal Formulation: With patient/family Time For Goal Achievement: 03/22/18 Potential to Achieve Goals: Good ADL Goals Pt Will Perform Lower Body Bathing: with set-up;with mod assist;with adaptive equipment;sit to/from stand Pt Will Perform Lower Body Dressing: with set-up;with mod assist;sit to/from stand;with adaptive equipment Pt Will Transfer to Toilet: with mod assist;stand pivot transfer;regular height toilet;grab bars  OT Frequency: Min 2X/week   Barriers to D/C:            Co-evaluation              AM-PAC PT "6 Clicks" Daily Activity     Outcome Measure Help from another person eating meals?: A  Little Help from another person taking care of personal grooming?: None Help from another person toileting, which includes using toliet, bedpan, or urinal?: A Lot Help from another person bathing (including washing, rinsing, drying)?: A Lot Help from another person to put on and taking off regular upper body clothing?: A Little Help from another person to put on and taking off regular lower body clothing?: A Lot 6 Click Score: 16   End of Session    Activity Tolerance: Patient tolerated treatment well Patient left: in bed;with call bell/phone within reach;with bed alarm set  OT Visit Diagnosis: History of falling (Z91.81);Muscle weakness (generalized) (M62.81);Pain;Other symptoms and signs involving the nervous system (R29.898)(Hx of Parkinson's disease ) Pain - Right/Left: Left Pain - part of body: Leg(B calves)                Time: 8938-1017 OT Time Calculation (min): 35 min Charges:  OT General Charges $OT Visit: 1 Visit OT Evaluation $OT Eval Low Complexity: 1 Low OT Treatments $Self Care/Home Management : 8-22 mins  Chrys Racer, OTR/L ascom [phone] 03/08/18, 1:23 PM

## 2018-03-08 NOTE — ED Notes (Signed)
TED hose applied to bilateral legs.

## 2018-03-08 NOTE — H&P (Signed)
Gavin Garcia is an 84 y.o. male.   Chief Complaint: Weakness HPI: The patient with past medical history of glaucoma, BPH and Parkinson's presents emergency department complaining of weakness in his lower extremities.  He states that his weakness thoroughly began 2 days ago and has progressively worsened.  He reports weakness only in his lower extremities.  It has become difficult for him to walk even with the help of his walker.  Otherwise, he has no complaints.  He denies fever, recent illness, new medication or vaccinations.  Due to his concerning pattern of weakness the emergency department staff called the hospitalist service for admission.  Past Medical History:  Diagnosis Date  . Basal cell carcinoma   . Glaucoma (increased eye pressure)   . Osteoporosis   . Prostate enlargement     History reviewed. No pertinent surgical history.  History reviewed. No pertinent family history. Social History:  reports that he has never smoked. He has never used smokeless tobacco. He reports that he does not drink alcohol. His drug history is not on file.  Allergies:  Allergies  Allergen Reactions  . Codeine Nausea And Vomiting    Medications Prior to Admission  Medication Sig Dispense Refill  . aspirin EC 81 MG tablet Take 81 mg by mouth daily.    . carbidopa-levodopa (SINEMET IR) 25-250 MG tablet Take 1 tablet by mouth 3 (three) times daily.  11  . dorzolamide-timolol (COSOPT) 22.3-6.8 MG/ML ophthalmic solution Place 1 drop into the left eye 2 (two) times daily.     Marland Kitchen doxycycline (PERIOSTAT) 20 MG tablet Take 20 mg by mouth 2 (two) times daily.    . finasteride (PROSCAR) 5 MG tablet Take 5 mg by mouth daily.    . furosemide (LASIX) 20 MG tablet Take 20 mg by mouth daily as needed for edema.     Marland Kitchen latanoprost (XALATAN) 0.005 % ophthalmic solution Place 1 drop into both eyes at bedtime.     . naproxen sodium (ALEVE) 220 MG tablet Take 220 mg by mouth daily.    . polyethylene glycol powder  (GLYCOLAX/MIRALAX) powder Take 17 g by mouth daily.      Results for orders placed or performed during the hospital encounter of 03/07/18 (from the past 48 hour(s))  CBC     Status: Abnormal   Collection Time: 03/07/18 11:04 PM  Result Value Ref Range   WBC 11.2 (H) 3.8 - 10.6 K/uL   RBC 4.24 (L) 4.40 - 5.90 MIL/uL   Hemoglobin 13.5 13.0 - 18.0 g/dL   HCT 38.8 (L) 40.0 - 52.0 %   MCV 91.6 80.0 - 100.0 fL   MCH 31.9 26.0 - 34.0 pg   MCHC 34.8 32.0 - 36.0 g/dL   RDW 14.4 11.5 - 14.5 %   Platelets 158 150 - 440 K/uL    Comment: Performed at [facility], [address] [place], [place] [address]  Basic metabolic panel     Status: Abnormal   Collection Time: 03/07/18 11:04 PM  Result Value Ref Range   Sodium 138 135 - 145 mmol/L   Potassium 3.8 3.5 - 5.1 mmol/L   Chloride 106 98 - 111 mmol/L   CO2 24 22 - 32 mmol/L   Glucose, Bld 121 (H) 70 - 99 mg/dL   BUN 41 (H) 8 - 23 mg/dL   Creatinine, Ser 0.80 0.61 - 1.24 mg/dL   Calcium 9.2 8.9 - 10.3 mg/dL   GFR calc non Af Amer >60 >60 mL/min  GFR calc Af Amer >60 >60 mL/min    Comment: (NOTE) The eGFR has been calculated using the CKD EPI equation. This calculation has not been validated in all clinical situations. eGFR's persistently <60 mL/min signify possible Chronic Kidney Disease.    Anion gap 8 5 - 15    Comment: Performed at [facility], [address] [place], [place] [address]  TSH     Status: None   Collection Time: 03/07/18 11:04 PM  Result Value Ref Range   TSH 2.321 0.350 - 4.500 uIU/mL    Comment: Performed by a 3rd Generation assay with a functional sensitivity of <=0.01 uIU/mL. Performed at [facility], [address] [place], [place] [address]   Urinalysis, Complete w Microscopic     Status: Abnormal   Collection Time: 03/07/18 11:49 PM  Result Value Ref Range   Color, Urine YELLOW (A) YELLOW   APPearance CLEAR (A) CLEAR   Specific Gravity, Urine 1.024 1.005 - 1.030   pH 6.0  5.0 - 8.0   Glucose, UA NEGATIVE NEGATIVE mg/dL   Hgb urine dipstick NEGATIVE NEGATIVE   Bilirubin Urine NEGATIVE NEGATIVE   Ketones, ur 20 (A) NEGATIVE mg/dL   Protein, ur NEGATIVE NEGATIVE mg/dL   Nitrite NEGATIVE NEGATIVE   Leukocytes, UA NEGATIVE NEGATIVE   RBC / HPF 0-5 0 - 5 RBC/hpf   WBC, UA 0-5 0 - 5 WBC/hpf   Bacteria, UA NONE SEEN NONE SEEN   Squamous Epithelial / LPF 0-5 0 - 5   Mucus PRESENT     Comment: Performed at [facility], [address] [place], [place] [address]   Ct Head Wo Contrast  Result Date: 03/08/2018 CLINICAL DATA:  84 y/o  M; 2-3 days of weakness. EXAM: CT HEAD WITHOUT CONTRAST TECHNIQUE: Contiguous axial images were obtained from the base of the skull through the vertex without intravenous contrast. COMPARISON:  02/26/2018 CT head FINDINGS: Brain: No evidence of acute infarction, hemorrhage, hydrocephalus, extra-axial collection or mass lesion/mass effect. Stable chronic microvascular ischemic changes and volume loss of the brain. Vascular: No hyperdense vessel or unexpected calcification. Skull: Normal. Negative for fracture or focal lesion. Sinuses/Orbits: No acute finding. Other: Bilateral intra-ocular lens replacement. IMPRESSION: 1. No acute intracranial abnormality identified. 2. Stable chronic microvascular ischemic changes and volume loss of the brain. Electronically Signed   By: Kristine Garbe M.D.   On: 03/08/2018 00:47    Review of Systems  Constitutional: Negative for chills and fever.  HENT: Negative for sore throat and tinnitus.   Eyes: Negative for blurred vision and redness.  Respiratory: Negative for cough and shortness of breath.   Cardiovascular: Negative for chest pain, palpitations, orthopnea and PND.  Gastrointestinal: Negative for abdominal pain, diarrhea, nausea and vomiting.  Genitourinary: Negative for dysuria, frequency and urgency.  Musculoskeletal: Negative for joint pain and myalgias.  Skin: Negative for  rash.       No lesions  Neurological: Positive for focal weakness. Negative for speech change and weakness.  Endo/Heme/Allergies: Does not bruise/bleed easily.       No temperature intolerance  Psychiatric/Behavioral: Negative for depression and suicidal ideas.    Blood pressure (!) 166/71, pulse 75, temperature 98.8 F (37.1 C), temperature source Oral, resp. rate 20, height _0  (1.727 m), weight 85.4 kg, SpO2 94 %. Physical Exam  Vitals reviewed. Constitutional: He is oriented to person, place, and time. He appears well-developed and well-nourished. No distress.  HENT:  Head: Normocephalic and atraumatic.  Mouth/Throat: Oropharynx  is clear and moist.  Eyes: Pupils are equal, round, and reactive to light. Conjunctivae and EOM are normal. No scleral icterus.  Neck: Normal range of motion. Neck supple. No JVD present. No tracheal deviation present. No thyromegaly present.  Cardiovascular: Normal rate, regular rhythm and normal heart sounds. Exam reveals no gallop and no friction rub.  No murmur heard. Respiratory: Effort normal and breath sounds normal.  GI: Soft. Bowel sounds are normal. He exhibits no distension. There is no tenderness.  Genitourinary:  Genitourinary Comments: Deferred  Musculoskeletal: Normal range of motion. He exhibits no edema.  Lymphadenopathy:    He has no cervical adenopathy.  Neurological: He is alert and oriented to person, place, and time. No cranial nerve deficit.  Skin: Skin is warm and dry. No rash noted. No erythema.  Psychiatric: He has a normal mood and affect. His behavior is normal. Judgment and thought content normal.     Assessment/Plan This is an 84 year old male admitted for lower extremity weakness. 1.  Weakness: Pacifically at hip flexors.  The patient is able to move his toes, ankles and bend his knees.  He has muted reflexes in both patellas.  The patient has full strength of his upper extremities and intact sensation in both lower  extremities.  Check TSH to rule out hypothyroidism.  PT/OT eval to determine if this is a conditioning problem.  If not then neurology consultation at the discretion of the primary team. 2.  Dehydration: Concentrated urine.  Hydrate with intravenous fluid.  Could contribute to her weakness. 3.  BPH: Continue finasteride 4.  Parkinson's disease: Continue Sinemet 5.  DVT prophylaxis: Lovenox 6.  GI prophylaxis: None The patient is a full code.  Time spent on admission orders and patient care approximately 45 minutes   Harrie Foreman, MD 03/08/2018, 7:36 AM

## 2018-03-08 NOTE — Progress Notes (Signed)
[place] [facility] [place] NAME: Gavin Garcia    MR#:  [id number]  DATE OF BIRTH:  [birth year]  SUBJECTIVE:   he is here due to significant weakness and having difficulty ambulating.  He had a fall about 10 days ago.  Patient's wife is at bedside along with daughter.  And also complaining of significant lower back pain.  REVIEW OF SYSTEMS:    Review of Systems  Constitutional: Negative for chills and fever.  HENT: Negative for congestion and tinnitus.   Eyes: Negative for blurred vision and double vision.  Respiratory: Negative for cough, shortness of breath and wheezing.   Cardiovascular: Negative for chest pain, orthopnea and PND.  Gastrointestinal: Negative for abdominal pain, diarrhea, nausea and vomiting.  Genitourinary: Negative for dysuria and hematuria.  Musculoskeletal: Positive for back pain and falls.  Neurological: Positive for weakness (Generalized weakness). Negative for dizziness, sensory change and focal weakness.  All other systems reviewed and are negative.   Nutrition: Heart Healthy Tolerating Diet: Yes Tolerating PT: Eval noted.   DRUG ALLERGIES:   Allergies  Allergen Reactions  . Codeine Nausea And Vomiting    VITALS:  Blood pressure (!) 115/51, pulse 65, temperature 97.6 F (36.4 C), temperature source Oral, resp. rate 14, height 5\' 8"  (1.727 m), weight 85.4 kg, SpO2 94 %.  PHYSICAL EXAMINATION:   Physical Exam  GENERAL:  84 y.o.-year-old patient lying in bed in no acute distress.  EYES: Pupils equal, round, reactive to light and accommodation. No scleral icterus. Extraocular muscles intact.  HEENT: Head atraumatic, normocephalic. Oropharynx and nasopharynx clear.  NECK:  Supple, no jugular venous distention. No thyroid enlargement, no tenderness.  LUNGS: Normal breath sounds bilaterally, no wheezing, rales, rhonchi. No use of accessory muscles of respiration.  CARDIOVASCULAR: S1, S2 normal. No murmurs, rubs,  or gallops.  ABDOMEN: Soft, nontender, nondistended. Bowel sounds present. No organomegaly or mass.  EXTREMITIES: No cyanosis, clubbing or edema b/l.    NEUROLOGIC: Cranial nerves II through XII are intact. No focal Motor or sensory deficits b/l. B/l Hip flexor weakness.  PSYCHIATRIC: The patient is alert and oriented x 3.  SKIN: No obvious rash, lesion, or ulcer.    LABORATORY PANEL:   CBC Recent Labs  Lab 03/07/18 2304  WBC 11.2*  HGB 13.5  HCT 38.8*  PLT 158   ------------------------------------------------------------------------------------------------------------------  Chemistries  Recent Labs  Lab 03/07/18 2304  NA 138  K 3.8  CL 106  CO2 24  GLUCOSE 121*  BUN 41*  CREATININE 0.80  CALCIUM 9.2   ------------------------------------------------------------------------------------------------------------------  Cardiac Enzymes No results for input(s): TROPONINI in the last 168 hours. ------------------------------------------------------------------------------------------------------------------  RADIOLOGY:  Ct Head Wo Contrast  Result Date: 03/08/2018 CLINICAL DATA:  84 y/o  M; 2-3 days of weakness. EXAM: CT HEAD WITHOUT CONTRAST TECHNIQUE: Contiguous axial images were obtained from the base of the skull through the vertex without intravenous contrast. COMPARISON:  02/26/2018 CT head FINDINGS: Brain: No evidence of acute infarction, hemorrhage, hydrocephalus, extra-axial collection or mass lesion/mass effect. Stable chronic microvascular ischemic changes and volume loss of the brain. Vascular: No hyperdense vessel or unexpected calcification. Skull: Normal. Negative for fracture or focal lesion. Sinuses/Orbits: No acute finding. Other: Bilateral intra-ocular lens replacement. IMPRESSION: 1. No acute intracranial abnormality identified. 2. Stable chronic microvascular ischemic changes and volume loss of the brain. Electronically Signed   By: Kristine Garbe  M.D.   On: 03/08/2018 00:47   Ct Lumbar Spine Wo Contrast  Result Date:  03/08/2018 CLINICAL DATA:  84 year old male with new onset lower extremity weakness several days ago. Fall earlier this month. EXAM: CT LUMBAR SPINE WITHOUT CONTRAST TECHNIQUE: Multidetector CT imaging of the lumbar spine was performed without intravenous contrast administration. Multiplanar CT image reconstructions were also generated. COMPARISON:  Lumbar radiographs 02/26/2018. FINDINGS: Segmentation: Normal. Alignment: Straightening and mild reversal of lumbar lordosis, with mild retrolisthesis of L3 on L4, stable from the radiographs earlier this month. Vertebrae: Osteopenia. The lower half of T12 is included and appears intact. The lumbar levels appear intact. There is a nondisplaced vertical fracture through the left sacral ala best seen on coronal image 33. There is a similar minimally displaced fracture through the anterior right S2 sacral ala best seen on coronal image 31. The visible SI joints remain intact. Paraspinal and other soft tissues: No paraspinal hematoma. Calcified aortic atherosclerosis. Negative visible noncontrast abdominal viscera. Disc levels: Flowing anterior osteophytes in the lower thoracic and lumbar spine resulting in intermittent spinal ankylosis, including at T12-L1, L1-L2, L4-L5. Multifactorial moderate to severe degenerative spinal stenosis at the intervening L3-L4 level where there is mild retrolisthesis. There is mild to moderate degenerative spinal stenosis at L4-L5. IMPRESSION: 1. Non - to minimally displaced bilateral sacral fractures, more extensive on the left. See coronal images 31 and 33. 2. No acute osseous abnormality in the lumbar spine. Diffuse idiopathic skeletal hyperostosis (DISH) with intermittent subsequent ankylosis. Moderate to severe degenerative spinal stenosis at the unfused L3-L4 level. 3.  Aortic Atherosclerosis (ICD10-I70.0). Electronically Signed   By: Genevie Ann M.D.   On:  03/08/2018 13:55     ASSESSMENT AND PLAN:   84 year old male with past medical history of glaucoma, Parkinson's disease, osteoporosis, status post recent fall who presents to the hospital due to bilateral lower extremity weakness and difficulty ambulating.  1.  Bilateral hip flexor weakness- etiology unclear but suspected to be secondary to underlying Parkinson's.  Patient had a fall 10 days ago and is complaining of some lower back pain, CT lumbar spine is suggestive of bilateral sacral fractures but otherwise no other acute pathology. - Continue supportive care with pain control, appreciate physical therapy evaluation. - I will have neurology see the patient tomorrow.  2.  History of Parkinson's disease-continue Sinemet. -Await neurological evaluation tomorrow.  3.  PAH-continue finasteride.  4.  Glaucoma-continue dorzolamide/timolol eyedrops.  ?? Need for SNF/STR. Discussed w/ Social Work.    All the records are reviewed and case discussed with Care Management/Social Worker. Management plans discussed with the patient, family and they are in agreement.  CODE STATUS: Full code  DVT Prophylaxis: Lovenox  TOTAL TIME TAKING CARE OF THIS PATIENT: 30 minutes.   POSSIBLE D/C IN 1-2 DAYS, DEPENDING ON CLINICAL CONDITION.   Henreitta Leber M.D on 03/08/2018 at 3:13 PM  Between 7am to 6pm - Pager - [phone]  After 6pm go to [email or web address] - Proofreader  [facility]  Office  [phone]  CC: Primary care physician; Derinda Late, MD

## 2018-03-08 NOTE — Care Management Important Message (Deleted)
Important Message  Patient Details  Name: Gavin Garcia MRN: [id number] Date of Birth: 04-09-1928   Medicare Important Message Given:  Yes    Shelbie Hutching, RN 03/08/2018, 2:34 PM

## 2018-03-08 NOTE — NC FL2 (Signed)
[place] LEVEL OF CARE SCREENING TOOL     IDENTIFICATION  Patient Name: Gavin Garcia Birthdate: [birth year] Sex: male Admission Date (Current Location): 03/07/2018  [place] and [place] Number:  Engineering geologist and Address:  [facility], [address] [place], [place] [address]      Provider Number: [id number]  Attending Physician Name and Address:  Henreitta Leber, MD  Relative Name and Phone Number:       Current Level of Care: Hospital Recommended Level of Care: [place] Prior Approval Number:    Date Approved/Denied:   PASRR Number:    Discharge Plan: SNF    Current Diagnoses: Patient Active Problem List   Diagnosis Date Noted  . Lower extremity weakness 03/08/2018    Orientation RESPIRATION BLADDER Height & Weight     Self, Situation, Place  Normal Continent Weight: 188 lb 4.4 oz (85.4 kg) Height:  5\' 8"  (172.7 cm)  BEHAVIORAL SYMPTOMS/MOOD NEUROLOGICAL BOWEL NUTRITION STATUS  (none) (none) Continent Diet(heart healthy)  AMBULATORY STATUS COMMUNICATION OF NEEDS Skin   Extensive Assist Verbally Normal                       Personal Care Assistance Level of Assistance              Functional Limitations Info  Hearing   Hearing Info: Impaired      SPECIAL CARE FACTORS FREQUENCY  PT (By licensed PT)                    Contractures Contractures Info: Not present    Additional Factors Info  Code Status, Allergies Code Status Info: full Allergies Info: codeine           Current Medications (03/08/2018):  This is the current hospital active medication list Current Facility-Administered Medications  Medication Dose Route Frequency Provider Last Rate Last Dose  . 0.9 %  sodium chloride infusion   Intravenous Continuous Harrie Foreman, MD 125 mL/hr at 03/08/18 1210    . acetaminophen (TYLENOL) tablet 650 mg  650 mg Oral Q6H PRN Harrie Foreman, MD   650 mg at  03/08/18 2376   Or  . acetaminophen (TYLENOL) suppository 650 mg  650 mg Rectal Q6H PRN Harrie Foreman, MD      . aspirin EC tablet 81 mg  81 mg Oral Daily Harrie Foreman, MD   81 mg at 03/08/18 1125  . carbidopa-levodopa (SINEMET IR) 25-250 MG per tablet immediate release 1 tablet  1 tablet Oral TID Harrie Foreman, MD   1 tablet at 03/08/18 1124  . docusate sodium (COLACE) capsule 100 mg  100 mg Oral BID Harrie Foreman, MD   100 mg at 03/08/18 1125  . dorzolamide-timolol (COSOPT) 22.3-6.8 MG/ML ophthalmic solution 1 drop  1 drop Left Eye BID Harrie Foreman, MD   1 drop at 03/08/18 1124  . enoxaparin (LOVENOX) injection 40 mg  40 mg Subcutaneous Q24H Harrie Foreman, MD      . finasteride (PROSCAR) tablet 5 mg  5 mg Oral Daily Harrie Foreman, MD   5 mg at 03/08/18 1125  . furosemide (LASIX) tablet 20 mg  20 mg Oral Daily PRN Harrie Foreman, MD      . latanoprost (XALATAN) 0.005 % ophthalmic solution 1 drop  1 drop Both Eyes QHS Harrie Foreman, MD      . naproxen (NAPROSYN)  tablet 250 mg  250 mg Oral Daily Harrie Foreman, MD   250 mg at 03/08/18 1123  . ondansetron (ZOFRAN) tablet 4 mg  4 mg Oral Q6H PRN Harrie Foreman, MD       Or  . ondansetron [facility]) injection 4 mg  4 mg Intravenous Q6H PRN Harrie Foreman, MD      . polyethylene glycol (MIRALAX / GLYCOLAX) packet 17 g  17 g Oral Daily Harrie Foreman, MD   17 g at 03/08/18 1123     Discharge Medications: Please see discharge summary for a list of discharge medications.  Relevant Imaging Results:  Relevant Lab Results:   Additional Information ss: [id number]  Shela Leff, LCSW

## 2018-03-08 NOTE — Care Management Note (Addendum)
Case Management Note  Patient Details  Name: Gavin Garcia MRN: [id number] Date of Birth: [birth year]   Patient admitted to hospital for lower extremity weakness.  Prior to admission lived at [facility] independent living with his wife.  His wife reports that he was mobile and got around well with his RW. The RW at home is his wife's and is too short.  He also uses a shower chair at home.   At the present the patient is unable to ambulate and even needs assistance with rolling in bed.  Patient and wife use transportation services from the [place].  Patient and wife have no barriers to obtaining medications.  PCP verified as Derinda Late.  [facility] uses [facility].  PT recommends SNF.  RNCM will continue to monitor progress.  Subjective/Objective:                    Action/Plan:   Expected Discharge Date:                  Expected Discharge Plan:  Skilled Nursing Facility  In-House Referral:  Clinical Social Work  Discharge planning Services  CM Consult  Post Acute Care Choice:    Choice offered to:     DME Arranged:    DME Agency:     HH Arranged:    [place] Agency:     Status of Service:  In process, will continue to follow  If discussed at Long Length of Stay Meetings, dates discussed:    Additional Comments:  Shelbie Hutching, RN 03/08/2018, 2:43 PM

## 2018-03-08 NOTE — Clinical Social Work Note (Signed)
Clinical Social Work Assessment  Patient Details  Name: Gavin Garcia MRN: [id number] Date of Birth: 1928-05-31  Date of referral:  03/08/18               Reason for consult:  Facility Placement                Permission sought to share information with:  Chartered certified accountant granted to share information::  Yes, Verbal Permission Granted  Name::        Agency::     Relationship::     Contact Information:     Housing/Transportation Living arrangements for the past 2 months:  [place] of Information:  Patient, Adult Children, Spouse Patient Interpreter Needed:  None Criminal Activity/Legal Involvement Pertinent to Current Situation/Hospitalization:  No - Comment as needed Significant Relationships:  Adult Children, Spouse Lives with:  Spouse Do you feel safe going back to the place where you live?    Need for family participation in patient care:  Yes (Comment)  Care giving concerns:  Patient resides at [facility] independent living with his spouse.   Social Worker assessment / plan:  CSW spoke with patient and his wife this afternoon regarding PT recommendation for short term rehab. Patient can go to [facility] for rehab as they are campus residents and patient and wife are agreeable to pursue this. Patient is regular [employer] and will require prior auth.    Employment status:    Insurance information:    PT Recommendations:  [place] / Referral to community resources:     Patient/Family's Response to care:  Patient and wife expressed appreciation for CSW assistance.   Patient/Family's Understanding of and Emotional Response to Diagnosis, Current Treatment, and Prognosis:  Patient is frustrated as he does not fell well today but very pleasant to CSW.  Emotional Assessment Appearance:  Appears stated age Attitude/Demeanor/Rapport:  (pleasant and cooperative) Affect (typically observed):   Accepting, Adaptable, Calm Orientation:  Oriented to Self, Oriented to Place, Oriented to  Time, Oriented to Situation Alcohol / Substance use:  Not Applicable Psych involvement (Current and /or in the community):  No (Comment)  Discharge Needs  Concerns to be addressed:  No discharge needs identified Readmission within the last 30 days:  No Current discharge risk:  None Barriers to Discharge:  No Barriers Identified   Shela Leff, LCSW 03/08/2018, 12:31 PM

## 2018-03-08 NOTE — ED Notes (Signed)
2nd call placed to Service Request for TED hose for pt.

## 2018-03-08 NOTE — Care Management Obs Status (Signed)
[place] NOTIFICATION   Patient Details  Name: Gavin Garcia MRN: [id number] Date of Birth: Nov 06, 1927   Medicare Observation Status Notification Given:  Yes    Shelbie Hutching, RN 03/08/2018, 2:40 PM

## 2018-03-08 NOTE — ED Provider Notes (Addendum)
[facility] Emergency Department Provider Note    First MD Initiated Contact with Patient 03/07/18 2347     (approximate)  I have reviewed the triage vital signs and the nursing notes.   HISTORY  Chief Complaint Weakness   HPI Gavin Garcia is a 84 y.o. male with bolus of chronic medical conditions presents to the emergency department with 2 day history of progressive bilateral lower extremity weakness. Patient states tonight he was unable to get off the commode and a such was seated there for approximately 2 hours. Patient denies any headache no dizziness no nausea vomiting diarrhea constipation or fever.   Past Medical History:  Diagnosis Date  . Basal cell carcinoma   . Glaucoma (increased eye pressure)   . Osteoporosis   . Prostate enlargement     Patient Active Problem List   Diagnosis Date Noted  . Lower extremity weakness 03/08/2018    Past surgical history   Prior to Admission medications   Medication Sig Start Date End Date Taking? Authorizing Provider  aspirin EC 81 MG tablet Take 81 mg by mouth daily.   Yes [provider]  carbidopa-levodopa (SINEMET IR) 25-250 MG tablet Take 1 tablet by mouth 3 (three) times daily. 01/20/18  Yes [provider]  dorzolamide-timolol (COSOPT) 22.3-6.8 MG/ML ophthalmic solution Place 1 drop into the left eye 2 (two) times daily.  01/25/18  Yes [provider]  doxycycline (PERIOSTAT) 20 MG tablet Take 20 mg by mouth 2 (two) times daily. 01/19/18  Yes [provider]  finasteride (PROSCAR) 5 MG tablet Take 5 mg by mouth daily. 03/24/17  Yes [provider]  furosemide (LASIX) 20 MG tablet Take 20 mg by mouth daily as needed for edema.  05/19/17  Yes [provider]  latanoprost (XALATAN) 0.005 % ophthalmic solution Place 1 drop into both eyes at bedtime.    Yes [provider]  naproxen sodium (ALEVE) 220 MG tablet Take 220 mg by mouth daily.   Yes  [provider]  polyethylene glycol powder (GLYCOLAX/MIRALAX) powder Take 17 g by mouth daily.   Yes [provider]    Allergies Codeine  History reviewed. No pertinent family history.  Social History Social History   Tobacco Use  . Smoking status: Never Smoker  . Smokeless tobacco: Never Used  Substance Use Topics  . Alcohol use: No  . Drug use: Not on file    Review of Systems Constitutional: Negative for fever/chills Eyes: No visual changes. ENT: No sore throat. Cardiovascular: Denies chest pain. Respiratory: Negative for shortness of breath Gastrointestinal: No abdominal pain.  No nausea, no vomiting.  No diarrhea.  No constipation. Genitourinary: Negative for dysuria. Musculoskeletal: Positive for bilateral lower extremity weakness Integumentary: Negative for rash. Neurological: Negative for headaches, focal weakness or numbness.   ____________________________________________   PHYSICAL EXAM:  VITAL SIGNS: ED Triage Vitals  Enc Vitals Group     BP 03/07/18 2303 (!) 177/85     Pulse Rate 03/07/18 2303 88     Resp 03/07/18 2303 20     Temp 03/07/18 2303 98.2 F (36.8 C)     Temp Source 03/07/18 2303 Oral     SpO2 03/07/18 2303 92 %     Weight 03/07/18 2305 83.9 kg (185 lb)     Height 03/07/18 2305 1.727 m (5\' 8" )     Head Circumference --      Peak Flow --      Pain Score 03/07/18  2305 8     Pain Loc --      Pain Edu? --      Excl. in ? --     Constitutional: Alert and oriented. Well appearing and in no acute distress. Eyes: Conjunctivae are normal.  Mouth/Throat: Mucous membranes are moist. Oropharynx non-erythematous. Neck: No stridor.  Cardiovascular: Normal rate, regular rhythm. Good peripheral circulation. Grossly normal heart sounds. Respiratory: Normal respiratory effort.  No retractions.  Clear to auscultation bilaterally Gastrointestinal: Soft and nontender. No distention.  Musculoskeletal: No lower extremity  tenderness nor edema. No gross deformities of extremities.  Patient able to raise bilateral lower extremity off the bed approximately 1 inch with leg swelling back to the bed within 5 seconds. Neurologic:  Normal speech and language. No gross focal neurologic deficits are appreciated.  Skin:  Skin is warm, dry and intact. No rash noted. Psychiatric: Mood and affect are normal. Speech and behavior are normal.  ____________________________________________   LABS (all labs ordered are listed, but only abnormal results are displayed)  Labs Reviewed  CBC - Abnormal; Notable for the following components:      Result Value   WBC 11.2 (*)    RBC 4.24 (*)    HCT 38.8 (*)    All other components within normal limits  BASIC METABOLIC PANEL - Abnormal; Notable for the following components:   Glucose, Bld 121 (*)    BUN 41 (*)    All other components within normal limits  URINALYSIS, COMPLETE (UACMP) WITH MICROSCOPIC - Abnormal; Notable for the following components:   Color, Urine YELLOW (*)    APPearance CLEAR (*)    Ketones, ur 20 (*)    All other components within normal limits  URINE CULTURE  TSH   ____________________________________________  EKG  ED ECG REPORT I, [place], the attending physician, personally viewed and interpreted this ECG.   Date: 03/08/2018  EKG Time: 10:57 PM  Rate: 88  Rhythm: Normal sinus rhythm  Axis: Normal  Intervals: Normal  ST&T Change: None  ____________________________________________  RADIOLOGY I, [place] [facility] Kors[place], personally viewed and evaluated these images (plain radiographs) as part of my medical decision making, as well as reviewing the written report by the radiologist.  ED MD interpretation:    Official radiology report(s): Ct Head Wo Contrast  Result Date: 03/08/2018 CLINICAL DATA:  84 y/o  M; 2-3 days of weakness. EXAM: CT HEAD WITHOUT CONTRAST TECHNIQUE: Contiguous axial images were obtained from the base of the skull  through the vertex without intravenous contrast. COMPARISON:  02/26/2018 CT head FINDINGS: Brain: No evidence of acute infarction, hemorrhage, hydrocephalus, extra-axial collection or mass lesion/mass effect. Stable chronic microvascular ischemic changes and volume loss of the brain. Vascular: No hyperdense vessel or unexpected calcification. Skull: Normal. Negative for fracture or focal lesion. Sinuses/Orbits: No acute finding. Other: Bilateral intra-ocular lens replacement. IMPRESSION: 1. No acute intracranial abnormality identified. 2. Stable chronic microvascular ischemic changes and volume loss of the brain. Electronically Signed   By: Kristine Garbe M.D.   On: 03/08/2018 00:47      Procedures   ____________________________________________   INITIAL IMPRESSION / ASSESSMENT AND PLAN / ED COURSE  As part of my medical decision making, I reviewed the following data within the electronic MEDICAL RECORD NUMBER   84 year old male presenting with above-stated history and physical exam of bilateral lower extremity weakness with inability to ambulate.  Considered possibility of intracranial pathology however suspect this to be unlikely CT scan was performed which was  unremarkable.  Also consider possibility of Debera Lat versus other potential neuromuscular disorders.  Patient's lab data  unremarkable.  Patient discussed with Dr. Marcille Blanco for hospital admission further evaluation and management ____________________________________________  FINAL CLINICAL IMPRESSION(S) / ED DIAGNOSES  Bilateral lower extremity weakness  MEDICATIONS GIVEN DURING THIS VISIT:  Medications  latanoprost (XALATAN) 0.005 % ophthalmic solution 1 drop (has no administration in time range)  carbidopa-levodopa (SINEMET IR) 25-250 MG per tablet immediate release 1 tablet (has no administration in time range)  aspirin EC tablet 81 mg (has no administration in time range)  dorzolamide-timolol (COSOPT) 22.3-6.8 MG/ML  ophthalmic solution 1 drop (has no administration in time range)  finasteride (PROSCAR) tablet 5 mg (has no administration in time range)  furosemide (LASIX) tablet 20 mg (has no administration in time range)  enoxaparin (LOVENOX) injection 40 mg (has no administration in time range)  acetaminophen (TYLENOL) tablet 650 mg (has no administration in time range)    Or  acetaminophen (TYLENOL) suppository 650 mg (has no administration in time range)  docusate sodium (COLACE) capsule 100 mg (has no administration in time range)  ondansetron (ZOFRAN) tablet 4 mg (has no administration in time range)    Or  ondansetron (ZOFRAN) injection 4 mg (has no administration in time range)  0.9 %  sodium chloride infusion ( Intravenous Rate/Dose Verify 03/08/18 0539)  naproxen (NAPROSYN) tablet 250 mg (has no administration in time range)  polyethylene glycol (MIRALAX / GLYCOLAX) packet 17 g (has no administration in time range)     ED Discharge Orders    None       Note:  This document was prepared using Dragon voice recognition software and may include unintentional dictation errors.    Gregor Hams, MD 03/08/18 7897    Gregor Hams, MD 03/08/18 [id number]

## 2018-03-08 NOTE — Evaluation (Addendum)
Physical Therapy Evaluation Patient Details Name: Gavin Garcia MRN: [id number] DOB: 1928-06-02 Today's Date: 03/08/2018   History of Present Illness  Patient is an 84 year old male admitted with c/o of LE weakness s/p fall.  PMH includes enlarged prostate, PD, glaucoma, basal cell CA and osteoporosis.  Clinical Impression  Patient is an 84 year old male who lives with his wife and has recently started ambulating with a RW following a fall one week ago.  Pt was alert and oriented and pt's daughter in room to assist with bed mobility.  Pt required max A for bed mobility and presented with poor sitting balance when at EOB.  A second person required to assist pt in sitting upright during coordination and MMT.  Pt presented with good overall strength with the exception of bilateral hip flexion (3-/5).  Pt also reported bilateral decreased sensation of lateral aspect of LL's.  He presented with good coordination of UE's and was limited in LE coordination testing due to hip flexion weakness.  Pt attempted to stand 3x and required +2 assist as well as VC's for upright posture.  He was able to stand for 2 min with use of RW for balance.  Pt became very fatigued and requested to return to bed.  PT and RN assisted pt in positioning for comfort.  Pt's daughter noticed pressure ulcers on pt's lower back/sacral area and RN was notified.  Pt will continue to benefit from skilled PT with focus on strength, safe functional mobility and tolerance to activity.    Follow Up Recommendations SNF    Equipment Recommendations  None recommended by PT    Recommendations for Other Services       Precautions / Restrictions Precautions Precautions: Fall Precaution Comments: High Fall risk Restrictions Weight Bearing Restrictions: No      Mobility  Bed Mobility Overal bed mobility: Needs Assistance Bed Mobility: Sit to Supine;Supine to Sit     Supine to sit: Max assist Sit to supine: Max assist   General  bed mobility comments: Required hand held assist and assistance to bring LE's over EOB. PT and pt's daughter assisted pt to EOB and in returning to bed; PT and RN assisted pt with positioning using pillows.  Transfers Overall transfer level: Needs assistance Equipment used: Rolling walker (2 wheeled) Transfers: Sit to/from Stand Sit to Stand: +2 physical assistance;Max assist         General transfer comment: Assistance to rise from bed from PT and pt's daughter; required 3 attempts and bed elevated to stand.  Several VC's provided for upright posture and to avoid knee flexion.  Pt was eventually able to stand at his baseline posture.  Ambulation/Gait Ambulation/Gait assistance: (Unsafe to attempt.)              Stairs            Wheelchair Mobility    Modified Rankin (Stroke Patients Only)       Balance Overall balance assessment: Needs assistance Sitting-balance support: Bilateral upper extremity supported;Feet supported Sitting balance-Leahy Scale: Poor Sitting balance - Comments: Requires bilateral UE's to sit upright at EOB.  Can use trunk strength to correct but returns to posterior lean after 5-10 sec. Postural control: Posterior lean Standing balance support: Bilateral upper extremity supported Standing balance-Leahy Scale: Poor                               Pertinent Vitals/Pain Pain  Assessment: No/denies pain    Home Living Family/patient expects to be discharged to:: Skilled nursing facility                      Prior Function Level of Independence: Independent with assistive device(s)         Comments: Pt has been using RW more frequently for household ambulation since recent fall.       Hand Dominance        Extremity/Trunk Assessment   Upper Extremity Assessment Upper Extremity Assessment: Overall WFL for tasks assessed    Lower Extremity Assessment Lower Extremity Assessment: Overall WFL for tasks assessed(Pt  presented with 3-/5 bilateral hip flexion.)    Cervical / Trunk Assessment Cervical / Trunk Assessment: Kyphotic(Severaly kyphotic.)  Communication   Communication: HOH  Cognition Arousal/Alertness: Awake/alert Behavior During Therapy: WFL for tasks assessed/performed Overall Cognitive Status: Within Functional Limits for tasks assessed                                 General Comments: Follows commands consistently.      General Comments      Exercises Other Exercises Other Exercises: Assisted pt in positioning for avoidance of pressure ulcers and VC's for bed mobility.  x8 min   Assessment/Plan    PT Assessment Patient needs continued PT services  PT Problem List Decreased strength;Decreased mobility;Decreased balance;Decreased knowledge of use of DME;Impaired sensation;Decreased activity tolerance       PT Treatment Interventions DME instruction;Therapeutic activities;Therapeutic exercise;Patient/family education;Gait training;Balance training;Neuromuscular re-education;Functional mobility training    PT Goals (Current goals can be found in the Care Plan section)  Acute Rehab PT Goals PT Goal Formulation: Patient unable to participate in goal setting    Frequency Min 2X/week   Barriers to discharge        Co-evaluation               AM-PAC PT "6 Clicks" Daily Activity  Outcome Measure Difficulty turning over in bed (including adjusting bedclothes, sheets and blankets)?: A Lot Difficulty moving from lying on back to sitting on the side of the bed? : A Lot Difficulty sitting down on and standing up from a chair with arms (e.g., wheelchair, bedside commode, etc,.)?: A Lot Help needed moving to and from a bed to chair (including a wheelchair)?: A Lot Help needed walking in hospital room?: Total Help needed climbing 3-5 steps with a railing? : Total 6 Click Score: 10    End of Session Equipment Utilized During Treatment: Gait belt Activity  Tolerance: Patient limited by fatigue Patient left: in bed;with bed alarm set;with family/visitor present;with nursing/sitter in room;with call bell/phone within reach Nurse Communication: Mobility status PT Visit Diagnosis: Unsteadiness on feet (R26.81);Muscle weakness (generalized) (M62.81)    Time: 7106-2694 PT Time Calculation (min) (ACUTE ONLY): 32 min   Charges:    PT Evaluation $1 Eval Mod Complexity: 1 Mod PT Treatments  $Therapeutic Activity: 8-22 mins ADDENDUM 907, 03/09/2018        Roxanne Gates, PT, DPT   Roxanne Gates  03/08/2018, 10:48 AM, ADDENDUM 907, 03/09/2018

## 2018-03-09 ENCOUNTER — Encounter
Admission: RE | Admit: 2018-03-09 | Discharge: 2018-03-09 | Disposition: A | Discharge status: Home / Self Care | Payer: Medicare HMO | Source: Ambulatory Visit | Attending: Internal Medicine | Admitting: Internal Medicine

## 2018-03-09 DIAGNOSIS — N400 Enlarged prostate without lower urinary tract symptoms: Secondary | ICD-10-CM | POA: Diagnosis not present

## 2018-03-09 DIAGNOSIS — R29898 Other symptoms and signs involving the musculoskeletal system: Secondary | ICD-10-CM | POA: Diagnosis not present

## 2018-03-09 DIAGNOSIS — E860 Dehydration: Secondary | ICD-10-CM | POA: Diagnosis not present

## 2018-03-09 DIAGNOSIS — S3210XA Unspecified fracture of sacrum, initial encounter for closed fracture: Secondary | ICD-10-CM | POA: Diagnosis not present

## 2018-03-09 DIAGNOSIS — G20 Parkinson's disease: Secondary | ICD-10-CM | POA: Diagnosis not present

## 2018-03-09 LAB — URINE CULTURE: Culture: NO GROWTH

## 2018-03-09 MED ORDER — GUAIFENESIN ER 600 MG PO TB12
600.0000 mg | ORAL_TABLET | Freq: Two times a day (BID) | ORAL | Status: DC | PRN
  Administered 2018-03-09 – 2018-03-11 (×3): 600 mg via ORAL
  Filled 2018-03-09 (×3): qty 1

## 2018-03-09 NOTE — Progress Notes (Signed)
[place] [facility] [place] NAME: Gavin Garcia    MR#:  [id number]  DATE OF BIRTH:  1927-10-08  SUBJECTIVE:   Patient still complaining of some lower back pain and weakness in his legs bilaterally.  Patient had CT of the lumbar spine yesterday showing bilateral sacral fractures.  Patient's family is at bedside.  Physical therapy is recommending short-term rehab.  REVIEW OF SYSTEMS:    Review of Systems  Constitutional: Negative for chills and fever.  HENT: Negative for congestion and tinnitus.   Eyes: Negative for blurred vision and double vision.  Respiratory: Negative for cough, shortness of breath and wheezing.   Cardiovascular: Negative for chest pain, orthopnea and PND.  Gastrointestinal: Negative for abdominal pain, diarrhea, nausea and vomiting.  Genitourinary: Negative for dysuria and hematuria.  Musculoskeletal: Positive for back pain and falls.  Neurological: Positive for weakness (Generalized weakness). Negative for dizziness, sensory change and focal weakness. Seizures:   All other systems reviewed and are negative.   Nutrition: Heart Healthy Tolerating Diet: Yes Tolerating PT: Eval noted.   DRUG ALLERGIES:   Allergies  Allergen Reactions  . Codeine Nausea And Vomiting    VITALS:  Blood pressure (!) 120/56, pulse 78, temperature 97.9 F (36.6 C), temperature source Oral, resp. rate 18, height 5\' 8"  (1.727 m), weight 85.2 kg, SpO2 97 %.  PHYSICAL EXAMINATION:   Physical Exam  GENERAL:  84 y.o.-year-old patient lying in bed in no acute distress.  EYES: Pupils equal, round, reactive to light and accommodation. No scleral icterus. Extraocular muscles intact.  HEENT: Head atraumatic, normocephalic. Oropharynx and nasopharynx clear.  NECK:  Supple, no jugular venous distention. No thyroid enlargement, no tenderness.  LUNGS: Normal breath sounds bilaterally, no wheezing, rales, rhonchi. No use of accessory muscles of  respiration.  CARDIOVASCULAR: S1, S2 normal. No murmurs, rubs, or gallops.  ABDOMEN: Soft, nontender, nondistended. Bowel sounds present. No organomegaly or mass.  EXTREMITIES: No cyanosis, clubbing or edema b/l.    NEUROLOGIC: Cranial nerves II through XII are intact. No focal Motor or sensory deficits b/l. B/l Hip flexor weakness.  PSYCHIATRIC: The patient is alert and oriented x 3.  SKIN: No obvious rash, lesion, or ulcer.    LABORATORY PANEL:   CBC Recent Labs  Lab 03/07/18 2304  WBC 11.2*  HGB 13.5  HCT 38.8*  PLT 158   ------------------------------------------------------------------------------------------------------------------  Chemistries  Recent Labs  Lab 03/07/18 2304  NA 138  K 3.8  CL 106  CO2 24  GLUCOSE 121*  BUN 41*  CREATININE 0.80  CALCIUM 9.2   ------------------------------------------------------------------------------------------------------------------  Cardiac Enzymes No results for input(s): TROPONINI in the last 168 hours. ------------------------------------------------------------------------------------------------------------------  RADIOLOGY:  Ct Head Wo Contrast  Result Date: 03/08/2018 CLINICAL DATA:  84 y/o  M; 2-3 days of weakness. EXAM: CT HEAD WITHOUT CONTRAST TECHNIQUE: Contiguous axial images were obtained from the base of the skull through the vertex without intravenous contrast. COMPARISON:  02/26/2018 CT head FINDINGS: Brain: No evidence of acute infarction, hemorrhage, hydrocephalus, extra-axial collection or mass lesion/mass effect. Stable chronic microvascular ischemic changes and volume loss of the brain. Vascular: No hyperdense vessel or unexpected calcification. Skull: Normal. Negative for fracture or focal lesion. Sinuses/Orbits: No acute finding. Other: Bilateral intra-ocular lens replacement. IMPRESSION: 1. No acute intracranial abnormality identified. 2. Stable chronic microvascular ischemic changes and volume loss of  the brain. Electronically Signed   By: Kristine Garbe M.D.   On: 03/08/2018 00:47   Ct Lumbar Spine Wo  Contrast  Result Date: 03/08/2018 CLINICAL DATA:  84 year old male with new onset lower extremity weakness several days ago. Fall earlier this month. EXAM: CT LUMBAR SPINE WITHOUT CONTRAST TECHNIQUE: Multidetector CT imaging of the lumbar spine was performed without intravenous contrast administration. Multiplanar CT image reconstructions were also generated. COMPARISON:  Lumbar radiographs 02/26/2018. FINDINGS: Segmentation: Normal. Alignment: Straightening and mild reversal of lumbar lordosis, with mild retrolisthesis of L3 on L4, stable from the radiographs earlier this month. Vertebrae: Osteopenia. The lower half of T12 is included and appears intact. The lumbar levels appear intact. There is a nondisplaced vertical fracture through the left sacral ala best seen on coronal image 33. There is a similar minimally displaced fracture through the anterior right S2 sacral ala best seen on coronal image 31. The visible SI joints remain intact. Paraspinal and other soft tissues: No paraspinal hematoma. Calcified aortic atherosclerosis. Negative visible noncontrast abdominal viscera. Disc levels: Flowing anterior osteophytes in the lower thoracic and lumbar spine resulting in intermittent spinal ankylosis, including at T12-L1, L1-L2, L4-L5. Multifactorial moderate to severe degenerative spinal stenosis at the intervening L3-L4 level where there is mild retrolisthesis. There is mild to moderate degenerative spinal stenosis at L4-L5. IMPRESSION: 1. Non - to minimally displaced bilateral sacral fractures, more extensive on the left. See coronal images 31 and 33. 2. No acute osseous abnormality in the lumbar spine. Diffuse idiopathic skeletal hyperostosis (DISH) with intermittent subsequent ankylosis. Moderate to severe degenerative spinal stenosis at the unfused L3-L4 level. 3.  Aortic Atherosclerosis  (ICD10-I70.0). Electronically Signed   By: Genevie Ann M.D.   On: 03/08/2018 13:55     ASSESSMENT AND PLAN:   84 year old male with past medical history of glaucoma, Parkinson's disease, osteoporosis, status post recent fall who presents to the hospital due to bilateral lower extremity weakness and difficulty ambulating.  1.  Bilateral hip flexor weakness- this is secondary to progressive Parkinson's disease and also combined with his recent fall and now developing bilateral sacral fractures. - Continue supportive care with pain control, physical therapy as tolerated. - Appreciate neurology input and they agree with supportive care and no further changes in management presently.   2.  Status post fall and bilateral sacral fractures- Orthopedics consult obtained.  Discussed case with Dr. Mack Guise who will see the patient later today.  As per him patient likely able to tolerate physical therapy  3.  History of Parkinson's disease-continue Sinemet. - appreciate Neuro input and pt. Likely has progressive decline of his PD. Cont. Current meds for now.   4.  BPH-continue finasteride.  5.  Glaucoma-continue dorzolamide/timolol eyedrops.  ?? Need for SNF/STR. Social work awaiting authorization from Insurance underwriter.    All the records are reviewed and case discussed with Care Management/Social Worker. Management plans discussed with the patient, family and they are in agreement.  CODE STATUS: Full code  DVT Prophylaxis: Lovenox  TOTAL TIME TAKING CARE OF THIS PATIENT: 30 minutes.   POSSIBLE D/C IN 1-2 DAYS, DEPENDING ON CLINICAL CONDITION.   Henreitta Leber M.D on 03/09/2018 at 3:05 PM  Between 7am to 6pm - Pager - [phone]  After 6pm go to [email or web address] - Proofreader  [facility]  Office  [phone]  CC: Primary care physician; Derinda Late, MD

## 2018-03-09 NOTE — Progress Notes (Signed)
Physical Therapy Treatment Patient Details Name: Gavin Garcia MRN: [id number] DOB: 18-Jul-1927 Today's Date: 03/09/2018    History of Present Illness Patient is an 84 year old male admitted with c/o of LE weakness s/p fall.  PMH includes enlarged prostate, PD, glaucoma, basal cell CA and osteoporosis.    PT Comments    Patient able to progress to standing pivot transfer with +2 assistance for management of RW and VC's for foot placement.  Pt better able to assist with bed mobility today but still required max A to sit upright to EOB.  He presented with visible pain during bed mobility and stated that this was coming from his lower back.  He required Mod-max A for standing pivot transfer and very frequent VC's for management of RW and to continue with movement of feet throughout transfer.  Pt was alert and oriented but hesitant with responses to questions at times.  PT guided pt through seated there ex and pt was able to complete sets without manual assistance once pt became comfortable with exercises, reporting no pain.  Pt will continue to benefit from skilled PT with focus on strength, tolerance to activity, safe functional mobility and balance. Discharge recommendation remains SNF due to level of assistance needed to prevent falls during functional mobility.  Follow Up Recommendations  SNF     Equipment Recommendations  None recommended by PT    Recommendations for Other Services       Precautions / Restrictions Precautions Precautions: Fall Precaution Comments: High Fall risk Restrictions Weight Bearing Restrictions: No    Mobility  Bed Mobility Overal bed mobility: Needs Assistance Bed Mobility: Supine to Sit     Supine to sit: Max assist Sit to supine: Max assist   General bed mobility comments: Required hand held assist and assistance to bring LE's over EOB. Pt able to assist more in bringing LE's over EOB today.  Max A provided for pt to sit  upright.  Transfers Overall transfer level: Needs assistance Equipment used: Rolling walker (2 wheeled) Transfers: Sit to/from [facility] Sit to Stand: Mod assist;+2 safety/equipment Stand pivot transfers: +2 safety/equipment;Mod assist       General transfer comment: Pt able to rise from elevated bed with Min A to rise.  Pt still very slow to initiate movement but required less VC's for upright posture.  PT and PT tech assisted pt in use of urinal during which pt was able to stand but reported quickly progressing fatigue.  Pt was still willing to perform standing pivot transfer to chair during which he required constant VC's for sequencing with RW and foot placement.  PT and PT tech provided mod-max A to lower pt to chair.  Ambulation/Gait Ambulation/Gait assistance: (Unsafe to attempt.)               Stairs             Wheelchair Mobility    Modified Rankin (Stroke Patients Only)       Balance Overall balance assessment: Needs assistance Sitting-balance support: Bilateral upper extremity supported;Feet supported Sitting balance-Leahy Scale: Poor Sitting balance - Comments: Requires bilateral UE's to sit upright at EOB.  Can use trunk strength to correct but returns to posterior lean after 5-10 sec. Postural control: Posterior lean Standing balance support: Bilateral upper extremity supported Standing balance-Leahy Scale: Poor Standing balance comment: RW required.  Cognition Arousal/Alertness: Awake/alert Behavior During Therapy: WFL for tasks assessed/performed Overall Cognitive Status: Within Functional Limits for tasks assessed                                 General Comments: Follows commands consistently.      Exercises General Exercises - Lower Extremity Ankle Circles/Pumps: 10 reps;Both;Seated Quad Sets: Strengthening;Both;10 reps;Seated Hip ABduction/ADduction:  Strengthening;Both;10 reps;Seated;AAROM(pillow squeeze for hip adduction, AAROM hip abduction.) Other Exercises Other Exercises: Reviewed all seated ther ex and advised pt concerning management of HEP and importance of frequency of exercise.  x10 min    General Comments        Pertinent Vitals/Pain      Home Living                      Prior Function            PT Goals (current goals can now be found in the care plan section) Acute Rehab PT Goals Patient Stated Goal: "to get my strength back again" Progress towards PT goals: Progressing toward goals    Frequency    Min 2X/week      PT Plan Current plan remains appropriate    Co-evaluation              AM-PAC PT "6 Clicks" Daily Activity  Outcome Measure  Difficulty turning over in bed (including adjusting bedclothes, sheets and blankets)?: A Lot Difficulty moving from lying on back to sitting on the side of the bed? : A Lot Difficulty sitting down on and standing up from a chair with arms (e.g., wheelchair, bedside commode, etc,.)?: A Lot Help needed moving to and from a bed to chair (including a wheelchair)?: A Lot Help needed walking in hospital room?: Total Help needed climbing 3-5 steps with a railing? : Total 6 Click Score: 10    End of Session Equipment Utilized During Treatment: Gait belt Activity Tolerance: Patient limited by fatigue Patient left: with family/visitor present;with call bell/phone within reach;in chair;with chair alarm set Nurse Communication: Mobility status PT Visit Diagnosis: Unsteadiness on feet (R26.81);Muscle weakness (generalized) (M62.81)     Time: 3267-1245 PT Time Calculation (min) (ACUTE ONLY): 33 min  Charges:  $Therapeutic Exercise: 8-22 mins $Therapeutic Activity: 8-22 mins                    Roxanne Gates, PT, DPT    Roxanne Gates 03/09/2018, 1:22 PM

## 2018-03-09 NOTE — Progress Notes (Signed)
Dr. Marcille Blanco notified of patient's request for Mucinex as he takes at home for relief for phlegm; acknowledged; new order written. Barbaraann Faster, RN 4:50 AM 03/09/2018

## 2018-03-09 NOTE — Consult Note (Signed)
ORTHOPAEDIC CONSULTATION  REQUESTING PHYSICIAN: Henreitta Leber, MD  Chief Complaint: Sacral fractures  HPI: IMRI LOR is a 84 y.o. male who is admitted for the acute onset of bilateral lower extremity weakness.  Patient states he sustained a fall on 02/26/2018.  He fell backwards landing on his buttocks and continued to fall and hit the back of his head.  Patient states that he did not experience weakness in his lower extremities until 9 days after his fall.  Patient is seen in his hospital room today with his wife at the bedside confirms his history.  Patient does have pain the posterior aspect of his pelvis.  He denies any numbness or tingling in his lower extremities.  Past Medical History:  Diagnosis Date  . Basal cell carcinoma   . Glaucoma (increased eye pressure)   . Osteoporosis   . Prostate enlargement    History reviewed. No pertinent surgical history. Social History   Socioeconomic History  . Marital status: Married    Spouse name: Not on file  . Number of children: Not on file  . Years of education: Not on file  . Highest education level: Not on file  Occupational History  . Not on file  Social Needs  . Financial resource strain: Not on file  . Food insecurity:    Worry: Not on file    Inability: Not on file  . Transportation needs:    Medical: Not on file    Non-medical: Not on file  Tobacco Use  . Smoking status: Never Smoker  . Smokeless tobacco: Never Used  Substance and Sexual Activity  . Alcohol use: No  . Drug use: Not on file  . Sexual activity: Not on file  Lifestyle  . Physical activity:    Days per week: Not on file    Minutes per session: Not on file  . Stress: Not on file  Relationships  . Social connections:    Talks on phone: Not on file    Gets together: Not on file    Attends religious service: Not on file    Active member of club or organization: Not on file    Attends meetings of clubs or organizations: Not on file     Relationship status: Not on file  Other Topics Concern  . Not on file  Social History Narrative  . Not on file   History reviewed. No pertinent family history. Allergies  Allergen Reactions  . Codeine Nausea And Vomiting   Prior to Admission medications   Medication Sig Start Date End Date Taking? Authorizing Provider  aspirin EC 81 MG tablet Take 81 mg by mouth daily.   Yes [provider]  carbidopa-levodopa (SINEMET IR) 25-250 MG tablet Take 1 tablet by mouth 3 (three) times daily. 01/20/18  Yes [provider]  dorzolamide-timolol (COSOPT) 22.3-6.8 MG/ML ophthalmic solution Place 1 drop into the left eye 2 (two) times daily.  01/25/18  Yes [provider]  doxycycline (PERIOSTAT) 20 MG tablet Take 20 mg by mouth 2 (two) times daily. 01/19/18  Yes [provider]  finasteride (PROSCAR) 5 MG tablet Take 5 mg by mouth daily. 03/24/17  Yes [provider]  furosemide (LASIX) 20 MG tablet Take 20 mg by mouth daily as needed for edema.  05/19/17  Yes [provider]  guaiFENesin (MUCINEX) 600 MG 12 hr tablet Take 600 mg by mouth 2 (two) times daily as needed for to loosen phlegm.   Yes [provider]  latanoprost (XALATAN) 0.005 % ophthalmic solution Place 1 drop into both eyes at bedtime.    Yes [provider]  naproxen sodium (ALEVE) 220 MG tablet Take 220 mg by mouth daily.   Yes [provider]  polyethylene glycol powder (GLYCOLAX/MIRALAX) powder Take 17 g by mouth daily.   Yes [provider]   Ct Head Wo Contrast  Result Date: 03/08/2018 CLINICAL DATA:  84 y/o  M; 2-3 days of weakness. EXAM: CT HEAD WITHOUT CONTRAST TECHNIQUE: Contiguous axial images were obtained from the base of the skull through the vertex without intravenous contrast. COMPARISON:  02/26/2018 CT head FINDINGS: Brain: No evidence of acute infarction, hemorrhage, hydrocephalus, extra-axial collection or mass lesion/mass effect. Stable  chronic microvascular ischemic changes and volume loss of the brain. Vascular: No hyperdense vessel or unexpected calcification. Skull: Normal. Negative for fracture or focal lesion. Sinuses/Orbits: No acute finding. Other: Bilateral intra-ocular lens replacement. IMPRESSION: 1. No acute intracranial abnormality identified. 2. Stable chronic microvascular ischemic changes and volume loss of the brain. Electronically Signed   By: Kristine Garbe M.D.   On: 03/08/2018 00:47   Ct Lumbar Spine Wo Contrast  Result Date: 03/08/2018 CLINICAL DATA:  84 year old male with new onset lower extremity weakness several days ago. Fall earlier this month. EXAM: CT LUMBAR SPINE WITHOUT CONTRAST TECHNIQUE: Multidetector CT imaging of the lumbar spine was performed without intravenous contrast administration. Multiplanar CT image reconstructions were also generated. COMPARISON:  Lumbar radiographs 02/26/2018. FINDINGS: Segmentation: Normal. Alignment: Straightening and mild reversal of lumbar lordosis, with mild retrolisthesis of L3 on L4, stable from the radiographs earlier this month. Vertebrae: Osteopenia. The lower half of T12 is included and appears intact. The lumbar levels appear intact. There is a nondisplaced vertical fracture through the left sacral ala best seen on coronal image 33. There is a similar minimally displaced fracture through the anterior right S2 sacral ala best seen on coronal image 31. The visible SI joints remain intact. Paraspinal and other soft tissues: No paraspinal hematoma. Calcified aortic atherosclerosis. Negative visible noncontrast abdominal viscera. Disc levels: Flowing anterior osteophytes in the lower thoracic and lumbar spine resulting in intermittent spinal ankylosis, including at T12-L1, L1-L2, L4-L5. Multifactorial moderate to severe degenerative spinal stenosis at the intervening L3-L4 level where there is mild retrolisthesis. There is mild to moderate degenerative spinal  stenosis at L4-L5. IMPRESSION: 1. Non - to minimally displaced bilateral sacral fractures, more extensive on the left. See coronal images 31 and 33. 2. No acute osseous abnormality in the lumbar spine. Diffuse idiopathic skeletal hyperostosis (DISH) with intermittent subsequent ankylosis. Moderate to severe degenerative spinal stenosis at the unfused L3-L4 level. 3.  Aortic Atherosclerosis (ICD10-I70.0). Electronically Signed   By: Genevie Ann M.D.   On: 03/08/2018 13:55    Positive ROS: All other systems have been reviewed and were otherwise negative with the exception of those mentioned in the HPI and as above.  Physical Exam: General: Alert, no acute distress  MUSCULOSKELETAL: Bilateral lower extremity: Patient had no shortening or external rotation of either lower extremity.  He had palpable pedal pulses, intact sensation light touch and intact motor function distally.  Did not have detectable weakness to manual strength testing.  Patient had no pain with logrolling of either hip.  He could actively flex his knees which did cause mild to moderate posterior pelvic pain.  Assessment: Nondisplaced sacral fractures  Plan: I reviewed the CT scan of the lumbar spine.  Patient was found to have minimally displaced  bilateral sacral fractures.  The radiologist explains that there were more diffuse on the left side.  Patient had no acute fractures of the spine seen on this study but did have diffuse idiopathic skeletal hyperostosis and moderate to severe degenerative spinal stenosis at L3-4.  The lower aspect of the sacrum is not visualized on the lumbar CT scan.  I am ordering a CT scan of the pelvis to visualize the entirety of his sacrum.  The fractures visualized currently are nondisplaced and do not require surgical intervention.  I also do not believe the sacral fractures are the etiology behind his lower extremity weakness.  Patient's spinal stenosis however seen on the CT scan could be contributing to  his lower extremity weakness.   She has been seen by Dr. Doy Mince from neurology.  It is felt the patient's weakness is multifactorial and related to progression of his Parkinson's disease along with pain from his recent fractures and severe lumbar spinal stenosis.  Patient may continue with his PT treatment.   If the patient and his family wish for further recommendations regarding his spinal stenosis I recommend neurosurgical consultation.  I will follow-up with the CT scan of the pelvis once this is completed tomorrow.   Thornton Park, MD    03/09/2018 6:39 PM

## 2018-03-09 NOTE — Consult Note (Signed)
Reason for Consult:LE weakness Referring Physician: Verdell Carmine  CC: LE weakness  HPI: Gavin Garcia is an 84 y.o. male with a history of PD followed by Dr. Manuella Ghazi on an outpatient basis who presents after a fall on 9/13 with progressive LE weakness.  Patient last seen by neurology on 12/22/17.  PD was felt to be progressive at that time with the patient moving slower, balance problems, having more difficulty with speech and swallowing.  Sinemet dosage was changed.    Past Medical History:  Diagnosis Date  . Basal cell carcinoma   . Glaucoma (increased eye pressure)   . Osteoporosis   . Prostate enlargement     History reviewed. No pertinent surgical history.  Family history: No family history of PD. Father with HTN and stroke.  Mother with breast cancer  Social History:  reports that he has never smoked. He has never used smokeless tobacco. He reports that he does not drink alcohol. His drug history is not on file.  Allergies  Allergen Reactions  . Codeine Nausea And Vomiting    Medications:  I have reviewed the patient's current medications. Prior to Admission:  Medications Prior to Admission  Medication Sig Dispense Refill Last Dose  . aspirin EC 81 MG tablet Take 81 mg by mouth daily.   03/07/2018 at Unknown time  . carbidopa-levodopa (SINEMET IR) 25-250 MG tablet Take 1 tablet by mouth 3 (three) times daily.  11 03/07/2018 at Unknown time  . dorzolamide-timolol (COSOPT) 22.3-6.8 MG/ML ophthalmic solution Place 1 drop into the left eye 2 (two) times daily.    03/07/2018 at am  . doxycycline (PERIOSTAT) 20 MG tablet Take 20 mg by mouth 2 (two) times daily.   03/07/2018 at Unknown time  . finasteride (PROSCAR) 5 MG tablet Take 5 mg by mouth daily.   03/07/2018 at Unknown time  . furosemide (LASIX) 20 MG tablet Take 20 mg by mouth daily as needed for edema.    prn at prn  . guaiFENesin (MUCINEX) 600 MG 12 hr tablet Take 600 mg by mouth 2 (two) times daily as needed for to loosen phlegm.      . latanoprost (XALATAN) 0.005 % ophthalmic solution Place 1 drop into both eyes at bedtime.    Past Week at Unknown time  . naproxen sodium (ALEVE) 220 MG tablet Take 220 mg by mouth daily.   03/07/2018 at Unknown time  . polyethylene glycol powder (GLYCOLAX/MIRALAX) powder Take 17 g by mouth daily.   03/07/2018 at Unknown time   Scheduled: . aspirin EC  81 mg Oral Daily  . carbidopa-levodopa  1 tablet Oral TID  . docusate sodium  100 mg Oral BID  . dorzolamide-timolol  1 drop Left Eye BID  . enoxaparin (LOVENOX) injection  40 mg Subcutaneous Q24H  . finasteride  5 mg Oral Daily  . latanoprost  1 drop Both Eyes QHS  . naproxen  250 mg Oral Daily  . polyethylene glycol  17 g Oral Daily    ROS: History obtained from the patient  General ROS: negative for - chills, fatigue, fever, night sweats, weight gain or weight loss Psychological ROS: negative for - behavioral disorder, hallucinations, memory difficulties, mood swings or suicidal ideation Ophthalmic ROS: negative for - blurry vision, double vision, eye pain or loss of vision ENT ROS: difficulty swallowing Allergy and Immunology ROS: negative for - hives or itchy/watery eyes Hematological and Lymphatic ROS: negative for - bleeding problems, bruising or swollen lymph nodes Endocrine ROS: negative for -  galactorrhea, hair pattern changes, polydipsia/polyuria or temperature intolerance Respiratory ROS: negative for - cough, hemoptysis, shortness of breath or wheezing Cardiovascular ROS: negative for - chest pain, dyspnea on exertion, edema or irregular heartbeat Gastrointestinal ROS: negative for - abdominal pain, diarrhea, hematemesis, nausea/vomiting or stool incontinence Genito-Urinary ROS: negative for - dysuria, hematuria, incontinence or urinary frequency/urgency Musculoskeletal ROS: negative for - joint swelling or muscular weakness Neurological ROS: as noted in HPI Dermatological ROS: negative for rash and skin lesion  changes  Physical Examination: Blood pressure (!) 149/60, pulse (!) 58, temperature 98.2 F (36.8 C), temperature source Oral, resp. rate 18, height 5\' 8"  (1.727 m), weight 85.2 kg, SpO2 94 %.  HEENT-  Normocephalic, no lesions, without obvious abnormality.  Normal external eye and conjunctiva.  Normal TM's bilaterally.  Normal auditory canals and external ears. Normal external nose, mucus membranes and septum.  Normal pharynx. Cardiovascular- S1, S2 normal, pulses palpable throughout   Lungs- chest clear, no wheezing, rales, normal symmetric air entry Abdomen- soft, non-tender; bowel sounds normal; no masses,  no organomegaly Extremities- mild LE edema Lymph-no adenopathy palpable Musculoskeletal-BLE pain Skin-warm and dry, no hyperpigmentation, vitiligo, or suspicious lesions  Neurological Examination   Mental Status: Alert, oriented, thought content appropriate.  Speech dysarthric but fluent.  Able to follow 3 step commands without difficulty.  Masked facies Cranial Nerves: II: Discs flat bilaterally; Visual fields grossly normal, pupils equal, round, reactive to light and accommodation III,IV, VI: ptosis not present, extra-ocular motions intact bilaterally V,VII: smile symmetric, facial light touch sensation normal bilaterally VIII: hearing normal bilaterally IX,X: gag reflex present XI: bilateral shoulder shrug XII: midline tongue extension Motor: 5/5 in the upper extremities and 2/5 in the LLE, 4+/5 in the RLE Some limitations based on pain Sensory: Pinprick and light touch intact throughout, bilaterally Deep Tendon Reflexes: 2+ in the upper extremities, trace at the knees and absent at the ankles Plantars: Right: upgoing   Left: upgoing Cerebellar: Heel-to-shin testing not performed due to weakness and pain Gait: not tested due to safety concerns  Laboratory Studies:   Basic Metabolic Panel: Recent Labs  Lab 03/07/18 2304  NA 138  K 3.8  CL 106  CO2 24  GLUCOSE  121*  BUN 41*  CREATININE 0.80  CALCIUM 9.2    Liver Function Tests: No results for input(s): AST, ALT, ALKPHOS, BILITOT, PROT, ALBUMIN in the last 168 hours. No results for input(s): LIPASE, AMYLASE in the last 168 hours. No results for input(s): AMMONIA in the last 168 hours.  CBC: Recent Labs  Lab 03/07/18 2304  WBC 11.2*  HGB 13.5  HCT 38.8*  MCV 91.6  PLT 158    Cardiac Enzymes: No results for input(s): CKTOTAL, CKMB, CKMBINDEX, TROPONINI in the last 168 hours.  BNP: Invalid input(s): POCBNP  CBG: No results for input(s): GLUCAP in the last 168 hours.  Microbiology: Results for orders placed or performed during the hospital encounter of 03/07/18  Urine culture     Status: None   Collection Time: 03/07/18 11:49 PM  Result Value Ref Range Status   Specimen Description   Final    URINE, RANDOM Performed at [facility], [address], [place], [place] [address]    Special Requests   Final    NONE Performed at [facility], [address], [place], [place] [address]    Culture   Final    NO GROWTH Performed at [facility], [place] [address], [place], [place] [address]  Report Status 03/09/2018 FINAL  Final    Coagulation Studies: No results for input(s): LABPROT, INR in the last 72 hours.  Urinalysis:  Recent Labs  Lab 03/07/18 2349  COLORURINE YELLOW*  LABSPEC 1.024  PHURINE 6.0  GLUCOSEU NEGATIVE  HGBUR NEGATIVE  BILIRUBINUR NEGATIVE  KETONESUR 20*  PROTEINUR NEGATIVE  NITRITE NEGATIVE  LEUKOCYTESUR NEGATIVE    Lipid Panel:  No results found for: CHOL, TRIG, HDL, CHOLHDL, VLDL, LDLCALC  HgbA1C: No results found for: HGBA1C  Urine Drug Screen:  No results found for: LABOPIA, COCAINSCRNUR, LABBENZ, AMPHETMU, THCU, LABBARB  Alcohol Level: No results for input(s): ETH in the last 168 hours.  Other results: EKG: sinus rhythm at 88 bpm.  Imaging: Ct Head Wo Contrast  Result Date: 03/08/2018 CLINICAL DATA:  84  y/o  M; 2-3 days of weakness. EXAM: CT HEAD WITHOUT CONTRAST TECHNIQUE: Contiguous axial images were obtained from the base of the skull through the vertex without intravenous contrast. COMPARISON:  02/26/2018 CT head FINDINGS: Brain: No evidence of acute infarction, hemorrhage, hydrocephalus, extra-axial collection or mass lesion/mass effect. Stable chronic microvascular ischemic changes and volume loss of the brain. Vascular: No hyperdense vessel or unexpected calcification. Skull: Normal. Negative for fracture or focal lesion. Sinuses/Orbits: No acute finding. Other: Bilateral intra-ocular lens replacement. IMPRESSION: 1. No acute intracranial abnormality identified. 2. Stable chronic microvascular ischemic changes and volume loss of the brain. Electronically Signed   By: Kristine Garbe M.D.   On: 03/08/2018 00:47   Ct Lumbar Spine Wo Contrast  Result Date: 03/08/2018 CLINICAL DATA:  85 year old male with new onset lower extremity weakness several days ago. Fall earlier this month. EXAM: CT LUMBAR SPINE WITHOUT CONTRAST TECHNIQUE: Multidetector CT imaging of the lumbar spine was performed without intravenous contrast administration. Multiplanar CT image reconstructions were also generated. COMPARISON:  Lumbar radiographs 02/26/2018. FINDINGS: Segmentation: Normal. Alignment: Straightening and mild reversal of lumbar lordosis, with mild retrolisthesis of L3 on L4, stable from the radiographs earlier this month. Vertebrae: Osteopenia. The lower half of T12 is included and appears intact. The lumbar levels appear intact. There is a nondisplaced vertical fracture through the left sacral ala best seen on coronal image 33. There is a similar minimally displaced fracture through the anterior right S2 sacral ala best seen on coronal image 31. The visible SI joints remain intact. Paraspinal and other soft tissues: No paraspinal hematoma. Calcified aortic atherosclerosis. Negative visible noncontrast  abdominal viscera. Disc levels: Flowing anterior osteophytes in the lower thoracic and lumbar spine resulting in intermittent spinal ankylosis, including at T12-L1, L1-L2, L4-L5. Multifactorial moderate to severe degenerative spinal stenosis at the intervening L3-L4 level where there is mild retrolisthesis. There is mild to moderate degenerative spinal stenosis at L4-L5. IMPRESSION: 1. Non - to minimally displaced bilateral sacral fractures, more extensive on the left. See coronal images 31 and 33. 2. No acute osseous abnormality in the lumbar spine. Diffuse idiopathic skeletal hyperostosis (DISH) with intermittent subsequent ankylosis. Moderate to severe degenerative spinal stenosis at the unfused L3-L4 level. 3.  Aortic Atherosclerosis (ICD10-I70.0). Electronically Signed   By: Genevie Ann M.D.   On: 03/08/2018 13:55     Assessment/Plan: 84 year old male with a history of PD who presents with LE weakness.  Patient has had CT of the lumbar spine.  CT reveals non to minimally displaced, bilateral sacral fractures (left greater than right) and moderate to severe spinal stenosis at L3-4.  Suspect that current decline is multifactorial and related to progression of PD along with  pain from recent fractures and severe lumbar spinal stenosis.  Patient and family do not wish for aggressive management at this time.  Would continue therapy for strengthening.  Unclear if a brace may help with therapy and/or pain.  Patient already scheduled to see outpatient neurologist.     No further neurologic intervention is recommended at this time.  If further questions arise, please call or page at that time.  Thank you for allowing neurology to participate in the care of this patient.   Alexis Goodell, MD Neurology [phone] 03/09/2018, 11:09 AM

## 2018-03-10 ENCOUNTER — Observation Stay: Payer: Medicare HMO

## 2018-03-10 DIAGNOSIS — R29898 Other symptoms and signs involving the musculoskeletal system: Secondary | ICD-10-CM | POA: Diagnosis not present

## 2018-03-10 DIAGNOSIS — N400 Enlarged prostate without lower urinary tract symptoms: Secondary | ICD-10-CM | POA: Diagnosis not present

## 2018-03-10 DIAGNOSIS — E860 Dehydration: Secondary | ICD-10-CM | POA: Diagnosis not present

## 2018-03-10 DIAGNOSIS — S3210XA Unspecified fracture of sacrum, initial encounter for closed fracture: Secondary | ICD-10-CM | POA: Diagnosis not present

## 2018-03-10 DIAGNOSIS — G20 Parkinson's disease: Secondary | ICD-10-CM | POA: Diagnosis not present

## 2018-03-10 LAB — CBC
HEMATOCRIT: 37 % — AB (ref 40.0–52.0)
HEMOGLOBIN: 13.1 g/dL (ref 13.0–18.0)
MCH: 32 pg (ref 26.0–34.0)
MCHC: 35.3 g/dL (ref 32.0–36.0)
MCV: 90.7 fL (ref 80.0–100.0)
PLATELETS: 156 10*3/uL (ref 150–440)
RBC: 4.08 MIL/uL — AB (ref 4.40–5.90)
RDW: 14 % (ref 11.5–14.5)
WBC: 7.7 10*3/uL (ref 3.8–10.6)

## 2018-03-10 MED ORDER — TRAMADOL HCL 50 MG PO TABS: 50.0000 mg | ORAL_TABLET | Freq: Four times a day (QID) | ORAL | Status: DC

## 2018-03-10 MED ORDER — TRAMADOL HCL 50 MG PO TABS
50.0000 mg | ORAL_TABLET | Freq: Four times a day (QID) | ORAL | Status: DC | PRN
  Administered 2018-03-10: 50 mg via ORAL
  Filled 2018-03-10: qty 1

## 2018-03-10 NOTE — Clinical Social Work Note (Signed)
Insurance auth still pending. Shela Leff MSW,LCSW [phone]

## 2018-03-10 NOTE — Clinical Social Work Note (Signed)
Gavin Garcia has received authorization and patient can discharge tomorrow to [facility] as the Josem Kaufmann is not good for today but to begin tomorrow. Shela Leff MsW,LCSW [phone]

## 2018-03-10 NOTE — Progress Notes (Signed)
Per MD okay for RN to order tramadol 50 mg PRN.

## 2018-03-10 NOTE — Progress Notes (Signed)
Reviewed CT of the pelvis.   No other pelvic fractures seen.  Patient has sacral insufficiency fractures which will not need surgery.   Patient has lumbar scoliosis and degenerative disc disease.   Consider consulting neurosurgery for recommendations for this.   CT notes bilateral hernias as well.  Continue with PT for lower extremity strengthening.  Will sign off for now.

## 2018-03-10 NOTE — Progress Notes (Signed)
Subjective:  Saw the patient is his room.  His daughter is at the bedside.  Patient reports sacral pain as mild.  He improved with PT today.   I reviewed the pelvic CT scan and shared these findings with the patient and his daughter.  Objective:   VITALS:   Vitals:   03/10/18 0357 03/10/18 0733 03/10/18 0735 03/10/18 1219  BP: (!) 186/72 (!) 186/76 (!) 177/64 (!) 127/57  Pulse: (!) 58 63 60 64  Resp: 20 18  (!) 24  Temp: 97.8 F (36.6 C) 98.4 F (36.9 C)  98.3 F (36.8 C)  TempSrc: Oral Oral  Oral  SpO2: 93% 94%  94%  Weight: 84.7 kg     Height:        PHYSICAL EXAM: Lower extremities: Neurovascular intact Sensation intact distally Intact pulses distally Dorsiflexion/Plantar flexion intact No cellulitis present Compartment soft  LABS  Results for orders placed or performed during the hospital encounter of 03/07/18 (from the past 24 hour(s))  CBC     Status: Abnormal   Collection Time: 03/10/18  5:38 AM  Result Value Ref Range   WBC 7.7 3.8 - 10.6 K/uL   RBC 4.08 (L) 4.40 - 5.90 MIL/uL   Hemoglobin 13.1 13.0 - 18.0 g/dL   HCT 37.0 (L) 40.0 - 52.0 %   MCV 90.7 80.0 - 100.0 fL   MCH 32.0 26.0 - 34.0 pg   MCHC 35.3 32.0 - 36.0 g/dL   RDW 14.0 11.5 - 14.5 %   Platelets 156 150 - 440 K/uL    Ct Pelvis Wo Contrast  Result Date: 03/10/2018 CLINICAL DATA:  Sacral fractures on prior CT lumbar spine, for further assessment of the bony pelvis EXAM: CT PELVIS WITHOUT CONTRAST TECHNIQUE: Multidetector CT imaging of the pelvis was performed following the standard protocol without intravenous contrast. COMPARISON:  CT lumbar spine 03/08/2018 FINDINGS: Urinary Tract:  Lower urinary tract unremarkable. Bowel:  Sigmoid colon diverticulosis. Vascular/Lymphatic: Aortoiliac atherosclerotic vascular disease. Reproductive:  Mild prostatomegaly. Other: Presacral edema. Trace fluid along the lower paracolic gutters of uncertain etiology. Musculoskeletal: CT confirms vertical fractures of  both sacral ala, tracking vertically adjacent to the SI joints, along with a transverse sacral component at the upper S3 vertebral level as shown on image 85/6. No discrete pubic ramus fractures. No other appreciable bony pelvic fractures or hip fracture. Left groin hernia favors a femoral hernia but could be a direct inguinal hernia, and contains adipose tissue. There is some mild nodularity in the herniated adipose tissue potentially reflecting lymph nodes along the left spermatic cord region. There is a smaller direct right inguinal hernia containing adipose tissue. There is spondylosis and degenerative disc disease in the lower lumbar spine, resulting in bilateral foraminal impingement at L5-S1 as well as mild to moderate central narrowing of the thecal sac at L4-5. IMPRESSION: 1. Acute vertical fractures of both sacral ala extending adjacent to the SI joints, with a transverse fracture of the sacrum at the S3 level. No pubic ramus fracture or other pelvic fractures. Appearance most compatible with sacral insufficiency fracture. 2. Bilateral groin hernias containing primarily adipose tissue. The left hernia, which could be a femoral or a direct inguinal hernia, has some associated small apparent lymph nodes in the herniated adipose tissue. 3. Lower lumbar spondylosis and degenerative disc disease with impingement at L4-5 and L5-S1. 4.  Aortic Atherosclerosis (ICD10-I70.0). 5. Mild prostatomegaly. 6. Sigmoid colon diverticulosis. Electronically Signed   By: Van Clines M.D.   On: 03/10/2018 07:52  Assessment/Plan:     Active Problems:   Lower extremity weakness  Patient may continue with PT.  No surgery is required for his sacral fractures.  No other pelvic fractures seen.   The lumbar spine CT shows moderate to severe degenerative spinal stenosis at L3-4.  I recommend a lumbosacral corset and neurosurgical evaluation of the spinal stenosis given his bilateral lower extremity weakness.  Will  sign off for now.    Thornton Park , MD 03/10/2018, 2:17 PM

## 2018-03-10 NOTE — Progress Notes (Signed)
Notified MD pt is refusing MRI to be done.

## 2018-03-10 NOTE — Progress Notes (Signed)
Physical Therapy Treatment Patient Details Name: Gavin Garcia MRN: [id number] DOB: 1928-01-18 Today's Date: 03/10/2018    History of Present Illness Patient is an 84 year old male admitted with c/o of LE weakness s/p fall.  Imaging completed and pt found to have sacral insufficiency fractures, conservative management.  Additionally found lumbar scoliosis and stenosis.  PMH includes enlarged prostate, PD, glaucoma, basal cell CA and osteoporosis.    PT Comments    Gavin Garcia remains limited by pain but put forth good effort with all mobility tasks.  His bed mobility improved, only requiring min assist to advance LLE to EOB.  Pt required mod +2 assist to stand from regular heigh bed and pt ambulated 6 ft with min assist.  Distance limited by pain.  Given pt's current mobility status, SNF remains most appropriate d/c plan.      Follow Up Recommendations  SNF     Equipment Recommendations  None recommended by PT    Recommendations for Other Services       Precautions / Restrictions Precautions Precautions: Fall Precaution Comments: High Fall risk Restrictions Weight Bearing Restrictions: No    Mobility  Bed Mobility Overal bed mobility: Needs Assistance Bed Mobility: Supine to Sit     Supine to sit: Min assist;HOB elevated     General bed mobility comments: Min assist to advance LLE to EOB, HOB elevated.  Transfers Overall transfer level: Needs assistance Equipment used: Rolling walker (2 wheeled) Transfers: Sit to/from Stand Sit to Stand: Mod assist;+2 physical assistance         General transfer comment: Pt requires mod +2 assist to boost from regular height bed level.    Ambulation/Gait Ambulation/Gait assistance: Min assist;+2 physical assistance;+2 safety/equipment Gait Distance (Feet): 6 Feet Assistive device: Rolling walker (2 wheeled) Gait Pattern/deviations: Step-through pattern;Decreased step length - right;Decreased step length - left;Antalgic;Trunk  flexed Gait velocity: decreased Gait velocity interpretation: <1.31 ft/sec, indicative of household ambulator General Gait Details: Dec step length Bil and pt relies on WBing through UEs on RW for support.  Distance limited due to pain.    Stairs             Wheelchair Mobility    Modified Rankin (Stroke Patients Only)       Balance Overall balance assessment: Needs assistance Sitting-balance support: No upper extremity supported;Feet supported Sitting balance-Leahy Scale: Fair Sitting balance - Comments: Pt able to sit EOB without UE support   Standing balance support: Bilateral upper extremity supported;During functional activity Standing balance-Leahy Scale: Poor Standing balance comment: Relies heavily on RW                             Cognition Arousal/Alertness: Awake/alert Behavior During Therapy: WFL for tasks assessed/performed Overall Cognitive Status: Within Functional Limits for tasks assessed                                        Exercises      General Comments        Pertinent Vitals/Pain Pain Assessment: 0-10 Pain Score: 8  Pain Location: Lower back/sacral region Pain Descriptors / Indicators: Aching;Guarding Pain Intervention(s): Limited activity within patient's tolerance;Monitored during session;Premedicated before session;Repositioned;RN gave pain meds during session    Home Living  Prior Function            PT Goals (current goals can now be found in the care plan section) Acute Rehab PT Goals Patient Stated Goal: "I don't feel like I'm making progress" PT Goal Formulation: With patient Progress towards PT goals: Progressing toward goals    Frequency    Min 2X/week      PT Plan Current plan remains appropriate    Co-evaluation              AM-PAC PT "6 Clicks" Daily Activity  Outcome Measure  Difficulty turning over in bed (including adjusting bedclothes,  sheets and blankets)?: Unable Difficulty moving from lying on back to sitting on the side of the bed? : Unable Difficulty sitting down on and standing up from a chair with arms (e.g., wheelchair, bedside commode, etc,.)?: Unable Help needed moving to and from a bed to chair (including a wheelchair)?: A Little Help needed walking in hospital room?: A Little Help needed climbing 3-5 steps with a railing? : Total 6 Click Score: 10    End of Session Equipment Utilized During Treatment: Gait belt Activity Tolerance: Patient limited by pain Patient left: in chair;with call bell/phone within reach;with chair alarm set;with family/visitor present Nurse Communication: Mobility status PT Visit Diagnosis: Unsteadiness on feet (R26.81);Muscle weakness (generalized) (M62.81)     Time: 6773-7366 PT Time Calculation (min) (ACUTE ONLY): 27 min  Charges:  $Gait Training: 8-22 mins $Therapeutic Activity: 8-22 mins                     Collie Siad PT, DPT 03/10/2018, 1:43 PM

## 2018-03-10 NOTE — Progress Notes (Signed)
[facility] NAME: Gavin Garcia    MR#:  [id number]  DATE OF BIRTH:  [birth year]  SUBJECTIVE:   Patient still complaining of some lower back pain.  Patient was seen by orthopedics and underwent CT scan which shows sacral fractures which are nondisplaced.  Pain is improved since yesterday after getting some oral tramadol.  REVIEW OF SYSTEMS:    Review of Systems  Constitutional: Negative for chills and fever.  HENT: Negative for congestion and tinnitus.   Eyes: Negative for blurred vision and double vision.  Respiratory: Negative for cough, shortness of breath and wheezing.   Cardiovascular: Negative for chest pain, orthopnea and PND.  Gastrointestinal: Negative for abdominal pain, diarrhea, nausea and vomiting.  Genitourinary: Negative for dysuria and hematuria.  Musculoskeletal: Positive for back pain and falls.  Neurological: Positive for weakness (Generalized weakness). Negative for dizziness, sensory change and focal weakness. Seizures:   All other systems reviewed and are negative.   Nutrition: Heart Healthy Tolerating Diet: Yes Tolerating PT: Eval noted.   DRUG ALLERGIES:   Allergies  Allergen Reactions  . Codeine Nausea And Vomiting    VITALS:  Blood pressure (!) 127/57, pulse 64, temperature 98.3 F (36.8 C), temperature source Oral, resp. rate (!) 24, height 5\' 8"  (1.727 m), weight 84.7 kg, SpO2 94 %.  PHYSICAL EXAMINATION:   Physical Exam  GENERAL:  84 y.o.-year-old patient lying in bed in no acute distress.  EYES: Pupils equal, round, reactive to light and accommodation. No scleral icterus. Extraocular muscles intact.  HEENT: Head atraumatic, normocephalic. Oropharynx and nasopharynx clear.  NECK:  Supple, no jugular venous distention. No thyroid enlargement, no tenderness.  LUNGS: Normal breath sounds bilaterally, no wheezing, rales, rhonchi. No use of accessory muscles of respiration.  CARDIOVASCULAR: S1,  S2 normal. No murmurs, rubs, or gallops.  ABDOMEN: Soft, nontender, nondistended. Bowel sounds present. No organomegaly or mass.  EXTREMITIES: No cyanosis, clubbing or edema b/l.    NEUROLOGIC: Cranial nerves II through XII are intact. No focal Motor or sensory deficits b/l. B/l Hip flexor weakness.  PSYCHIATRIC: The patient is alert and oriented x 3.  SKIN: No obvious rash, lesion, or ulcer.    LABORATORY PANEL:   CBC Recent Labs  Lab 03/10/18 0538  WBC 7.7  HGB 13.1  HCT 37.0*  PLT 156   ------------------------------------------------------------------------------------------------------------------  Chemistries  Recent Labs  Lab 03/07/18 2304  NA 138  K 3.8  CL 106  CO2 24  GLUCOSE 121*  BUN 41*  CREATININE 0.80  CALCIUM 9.2   ------------------------------------------------------------------------------------------------------------------  Cardiac Enzymes No results for input(s): TROPONINI in the last 168 hours. ------------------------------------------------------------------------------------------------------------------  RADIOLOGY:  Ct Pelvis Wo Contrast  Result Date: 03/10/2018 CLINICAL DATA:  Sacral fractures on prior CT lumbar spine, for further assessment of the bony pelvis EXAM: CT PELVIS WITHOUT CONTRAST TECHNIQUE: Multidetector CT imaging of the pelvis was performed following the standard protocol without intravenous contrast. COMPARISON:  CT lumbar spine 03/08/2018 FINDINGS: Urinary Tract:  Lower urinary tract unremarkable. Bowel:  Sigmoid colon diverticulosis. Vascular/Lymphatic: Aortoiliac atherosclerotic vascular disease. Reproductive:  Mild prostatomegaly. Other: Presacral edema. Trace fluid along the lower paracolic gutters of uncertain etiology. Musculoskeletal: CT confirms vertical fractures of both sacral ala, tracking vertically adjacent to the SI joints, along with a transverse sacral component at the upper S3 vertebral level as shown on image  85/6. No discrete pubic ramus fractures. No other appreciable bony pelvic fractures or hip fracture. Left groin hernia favors a  femoral hernia but could be a direct inguinal hernia, and contains adipose tissue. There is some mild nodularity in the herniated adipose tissue potentially reflecting lymph nodes along the left spermatic cord region. There is a smaller direct right inguinal hernia containing adipose tissue. There is spondylosis and degenerative disc disease in the lower lumbar spine, resulting in bilateral foraminal impingement at L5-S1 as well as mild to moderate central narrowing of the thecal sac at L4-5. IMPRESSION: 1. Acute vertical fractures of both sacral ala extending adjacent to the SI joints, with a transverse fracture of the sacrum at the S3 level. No pubic ramus fracture or other pelvic fractures. Appearance most compatible with sacral insufficiency fracture. 2. Bilateral groin hernias containing primarily adipose tissue. The left hernia, which could be a femoral or a direct inguinal hernia, has some associated small apparent lymph nodes in the herniated adipose tissue. 3. Lower lumbar spondylosis and degenerative disc disease with impingement at L4-5 and L5-S1. 4.  Aortic Atherosclerosis (ICD10-I70.0). 5. Mild prostatomegaly. 6. Sigmoid colon diverticulosis. Electronically Signed   By: Van Clines M.D.   On: 03/10/2018 07:52     ASSESSMENT AND PLAN:   84 year old male with past medical history of glaucoma, Parkinson's disease, osteoporosis, status post recent fall who presents to the hospital due to bilateral lower extremity weakness and difficulty ambulating.  1.  Bilateral hip flexor weakness- this is secondary to progressive Parkinson's disease and also combined with his recent fall and now developing bilateral sacral fractures. - Continue supportive care with pain control, physical therapy as tolerated. - Appreciate neurology input and they agree with supportive care and  no further changes in management presently.   2.  Status post fall and bilateral sacral fractures- Orthopedics consult obtained.  Seen by Dr. Mack Guise and no plans for surgical intervention.  Cont. Pain control and PT as tolerated.   3.  History of Parkinson's disease-continue Sinemet. - appreciate Neuro input and pt. Likely has progressive decline of his PD. Cont. Current meds for now.   4.  BPH-continue finasteride.  5.  Glaucoma-continue dorzolamide/timolol eyedrops.  PT recommending SNF/STR and awaiting [facility].  Possible d/c there tomorrow or later today if possible.    All the records are reviewed and case discussed with Care Management/Social Worker. Management plans discussed with the patient, family and they are in agreement.  CODE STATUS: Full code  DVT Prophylaxis: Lovenox  TOTAL TIME TAKING CARE OF THIS PATIENT: 25 minutes.   POSSIBLE D/C IN 1-2 DAYS, DEPENDING ON CLINICAL CONDITION.   Henreitta Leber M.D on 03/10/2018 at 2:50 PM  Between 7am to 6pm - Pager - [phone]  After 6pm go to [email or web address] - Proofreader  [facility]  Office  [phone]  CC: Primary care physician; Derinda Late, MD

## 2018-03-10 NOTE — Consult Note (Signed)
Referring Physician:  No referring provider defined for this encounter.  Primary Physician:  Derinda Late, MD  Chief Complaint:  weakness  History of Present Illness: 03/10/2018 Gavin Garcia is a 84 y.o. male who presents with the chief complaint of leg weakness and lower back pain.  He suffered a fall from standing approximately 12 days ago, and was evaluated in the ER.  He re-presented 3 days ago after inability to bear weight, which is abnormal for him.  He is usually able to walk with a walker without difficulty for at least 100 feet.  He describes relative weakness left>right.  He also has lower back pain, particularly when standing.  He has a brace that he is now using for PT.  Dr. Mack Guise saw the patient for his sacral fractures, and has not recommended any surgical treatment.   Review of Systems:  A 10 point review of systems is negative, except for the pertinent positives and negatives detailed in the HPI.  Past Medical History: Past Medical History:  Diagnosis Date  . Basal cell carcinoma   . Glaucoma (increased eye pressure)   . Osteoporosis   . Prostate enlargement     Past Surgical History: History reviewed. No pertinent surgical history.  Allergies: Allergies as of 03/07/2018 - Review Complete 03/07/2018  Allergen Reaction Noted  . Codeine Nausea And Vomiting 11/17/2014    Medications:  Current Facility-Administered Medications:  .  acetaminophen (TYLENOL) tablet 650 mg, 650 mg, Oral, Q6H PRN, 650 mg at 03/10/18 1112 **OR** acetaminophen (TYLENOL) suppository 650 mg, 650 mg, Rectal, Q6H PRN, Harrie Foreman, MD .  aspirin EC tablet 81 mg, 81 mg, Oral, Daily, Harrie Foreman, MD, 81 mg at 03/10/18 0846 .  carbidopa-levodopa (SINEMET IR) 25-250 MG per tablet immediate release 1 tablet, 1 tablet, Oral, TID, Harrie Foreman, MD, 1 tablet at 03/10/18 1534 .  docusate sodium (COLACE) capsule 100 mg, 100 mg, Oral, BID, Harrie Foreman, MD, 100  mg at 03/10/18 0846 .  dorzolamide-timolol (COSOPT) 22.3-6.8 MG/ML ophthalmic solution 1 drop, 1 drop, Left Eye, BID, Harrie Foreman, MD, 1 drop at 03/10/18 0849 .  enoxaparin (LOVENOX) injection 40 mg, 40 mg, Subcutaneous, Q24H, Harrie Foreman, MD, 40 mg at 03/09/18 2059 .  finasteride (PROSCAR) tablet 5 mg, 5 mg, Oral, Daily, Harrie Foreman, MD, 5 mg at 03/10/18 0847 .  furosemide (LASIX) tablet 20 mg, 20 mg, Oral, Daily PRN, Harrie Foreman, MD .  guaiFENesin [facility]) 12 hr tablet 600 mg, 600 mg, Oral, BID PRN, Harrie Foreman, MD, 600 mg at 03/09/18 1912 .  latanoprost (XALATAN) 0.005 % ophthalmic solution 1 drop, 1 drop, Both Eyes, QHS, Harrie Foreman, MD, 1 drop at 03/09/18 2058 .  naproxen (NAPROSYN) tablet 250 mg, 250 mg, Oral, Daily, Harrie Foreman, MD, 250 mg at 03/10/18 0847 .  ondansetron (ZOFRAN) tablet 4 mg, 4 mg, Oral, Q6H PRN **OR** ondansetron (ZOFRAN) injection 4 mg, 4 mg, Intravenous, Q6H PRN, Harrie Foreman, MD .  polyethylene glycol (MIRALAX / GLYCOLAX) packet 17 g, 17 g, Oral, Daily, Harrie Foreman, MD, 17 g at 03/08/18 1123 .  traMADol (ULTRAM) tablet 50 mg, 50 mg, Oral, Q6H PRN, Henreitta Leber, MD, 50 mg at 03/10/18 9211   Social History: Social History   Tobacco Use  . Smoking status: Never Smoker  . Smokeless tobacco: Never Used  Substance Use Topics  . Alcohol use: No  . Drug use: Not on file  Family Medical History: History reviewed. No pertinent family history.  Physical Examination: Vitals:   03/10/18 0735 03/10/18 1219  BP: (!) 177/64 (!) 127/57  Pulse: 60 64  Resp:  (!) 24  Temp:  98.3 F (36.8 C)  SpO2:  94%     General: Patient is well developed, well nourished, calm, collected, and in no apparent distress.  Psychiatric: Patient is non-anxious.  Head:  Pupils equal, round, and reactive to light.  ENT:  Oral mucosa appears well hydrated.  Neck:   Supple.  Full range of  motion.  Respiratory: Patient is breathing without any difficulty.  Extremities: No edema.  Vascular: Palpable pulses in dorsal pedal vessels.  Skin:   On exposed skin, there are no abnormal skin lesions.  NEUROLOGICAL:  General: In no acute distress.   Awake, alert, oriented to person, place, and time.  Pupils equal round and reactive to light.  Facial tone is symmetric.  Tongue protrusion is midline.  There is no pronator drift.      Strength: Side Biceps Triceps Deltoid Interossei Grip Wrist Ext. Wrist Flex.  R 5 5 5 5 5 5 5   L 5 5 5 5 5 5 5    Side Iliopsoas Quads Hamstring PF DF EHL  R 5 5 5 5 5 5   L 4+ 5 5 5 5 5    Reflexes are 1+ and symmetric at the biceps, triceps, brachioradialis, patella and achilles.   Bilateral upper and lower extremity sensation is intact to light touch.  Clonus is not present.  Toes are down-going.  Gait is untested,  Hoffman's is absent.  Imaging: CT L spine 03/08/2018 IMPRESSION: 1. Non - to minimally displaced bilateral sacral fractures, more extensive on the left. See coronal images 31 and 33. 2. No acute osseous abnormality in the lumbar spine. Diffuse idiopathic skeletal hyperostosis (DISH) with intermittent subsequent ankylosis. Moderate to severe degenerative spinal stenosis at the unfused L3-L4 level. 3.  Aortic Atherosclerosis (ICD10-I70.0).   Electronically Signed   By: Genevie Ann M.D.   On: 03/08/2018 13:55  I have personally reviewed the images and agree with the above interpretation.  Assessment and Plan: Gavin Garcia is a pleasant 84 y.o. male with recent onset of leg weakness with CT evidence of lumbar stenosis.  His weakness could be the result of many causes, or could be multifactorial.  He could have severe stenosis based on the CT scan, but MRI would confirm this diagnosis.  I would recommend MRI L spine to evaluate this and his sacral fractures.  If his sacral fractures are acute, one could consider sacroplasty (Dr.  Rudene Christians), though Gavin Garcia is not a good surgical candidate due to his age and comorbidities.   Raejean Swinford K. Izora Ribas MD, [place] [facility] of [facility]

## 2018-03-10 NOTE — Progress Notes (Signed)
RN call [facility] to order lumbar corsett. They will deliver it tomorrow.

## 2018-03-11 DIAGNOSIS — H5789 Other specified disorders of eye and adnexa: Secondary | ICD-10-CM | POA: Diagnosis not present

## 2018-03-11 DIAGNOSIS — M8008XD Age-related osteoporosis with current pathological fracture, vertebra(e), subsequent encounter for fracture with routine healing: Secondary | ICD-10-CM | POA: Diagnosis not present

## 2018-03-11 DIAGNOSIS — R296 Repeated falls: Secondary | ICD-10-CM | POA: Diagnosis not present

## 2018-03-11 DIAGNOSIS — S3210XD Unspecified fracture of sacrum, subsequent encounter for fracture with routine healing: Secondary | ICD-10-CM | POA: Diagnosis not present

## 2018-03-11 DIAGNOSIS — R1084 Generalized abdominal pain: Secondary | ICD-10-CM | POA: Diagnosis not present

## 2018-03-11 DIAGNOSIS — R531 Weakness: Secondary | ICD-10-CM | POA: Diagnosis not present

## 2018-03-11 DIAGNOSIS — S3210XS Unspecified fracture of sacrum, sequela: Secondary | ICD-10-CM | POA: Diagnosis not present

## 2018-03-11 DIAGNOSIS — G20 Parkinson's disease: Secondary | ICD-10-CM | POA: Diagnosis not present

## 2018-03-11 DIAGNOSIS — N400 Enlarged prostate without lower urinary tract symptoms: Secondary | ICD-10-CM | POA: Diagnosis not present

## 2018-03-11 DIAGNOSIS — I6782 Cerebral ischemia: Secondary | ICD-10-CM | POA: Diagnosis not present

## 2018-03-11 DIAGNOSIS — Z7401 Bed confinement status: Secondary | ICD-10-CM | POA: Diagnosis not present

## 2018-03-11 DIAGNOSIS — M48061 Spinal stenosis, lumbar region without neurogenic claudication: Secondary | ICD-10-CM | POA: Diagnosis not present

## 2018-03-11 DIAGNOSIS — R29898 Other symptoms and signs involving the musculoskeletal system: Secondary | ICD-10-CM | POA: Diagnosis not present

## 2018-03-11 DIAGNOSIS — H409 Unspecified glaucoma: Secondary | ICD-10-CM | POA: Diagnosis not present

## 2018-03-11 DIAGNOSIS — S3210XA Unspecified fracture of sacrum, initial encounter for closed fracture: Secondary | ICD-10-CM | POA: Diagnosis not present

## 2018-03-11 DIAGNOSIS — M5136 Other intervertebral disc degeneration, lumbar region: Secondary | ICD-10-CM | POA: Diagnosis not present

## 2018-03-11 DIAGNOSIS — I959 Hypotension, unspecified: Secondary | ICD-10-CM | POA: Diagnosis not present

## 2018-03-11 DIAGNOSIS — R2689 Other abnormalities of gait and mobility: Secondary | ICD-10-CM | POA: Diagnosis not present

## 2018-03-11 DIAGNOSIS — M810 Age-related osteoporosis without current pathological fracture: Secondary | ICD-10-CM | POA: Diagnosis not present

## 2018-03-11 DIAGNOSIS — F0280 Dementia in other diseases classified elsewhere without behavioral disturbance: Secondary | ICD-10-CM | POA: Diagnosis not present

## 2018-03-11 DIAGNOSIS — I10 Essential (primary) hypertension: Secondary | ICD-10-CM | POA: Diagnosis not present

## 2018-03-11 DIAGNOSIS — R262 Difficulty in walking, not elsewhere classified: Secondary | ICD-10-CM | POA: Diagnosis not present

## 2018-03-11 DIAGNOSIS — H8110 Benign paroxysmal vertigo, unspecified ear: Secondary | ICD-10-CM | POA: Diagnosis not present

## 2018-03-11 DIAGNOSIS — M6281 Muscle weakness (generalized): Secondary | ICD-10-CM | POA: Diagnosis not present

## 2018-03-11 DIAGNOSIS — E860 Dehydration: Secondary | ICD-10-CM | POA: Diagnosis not present

## 2018-03-11 DIAGNOSIS — M25572 Pain in left ankle and joints of left foot: Secondary | ICD-10-CM | POA: Diagnosis not present

## 2018-03-11 DIAGNOSIS — I83893 Varicose veins of bilateral lower extremities with other complications: Secondary | ICD-10-CM | POA: Diagnosis not present

## 2018-03-11 MED ORDER — HYDROCODONE-ACETAMINOPHEN 5-325 MG PO TABS: 1.0000 | ORAL_TABLET | Freq: Four times a day (QID) | ORAL | 0 refills | Status: DC | PRN

## 2018-03-11 MED ORDER — ACETAMINOPHEN 325 MG PO TABS: 650.0000 mg | ORAL_TABLET | Freq: Four times a day (QID) | ORAL | Status: DC | PRN

## 2018-03-11 NOTE — Progress Notes (Signed)
Gavin Garcia  A and O x 4. VSS. Pt tolerating diet well. No complaints of pain or nausea. IV removed intact, prescriptions given. Pt voiced understanding of discharge instructions with no further questions. Pt discharged via EMS to [facility].    Allergies as of 03/11/2018      Reactions   Codeine Nausea And Vomiting      Medication List    STOP taking these medications   doxycycline 20 MG tablet Commonly known as:  PERIOSTAT     TAKE these medications   acetaminophen 325 MG tablet Commonly known as:  TYLENOL Take 2 tablets (650 mg total) by mouth every 6 (six) hours as needed for mild pain (or Fever >/= 101).   aspirin EC 81 MG tablet Take 81 mg by mouth daily.   carbidopa-levodopa 25-250 MG tablet Commonly known as:  SINEMET IR Take 1 tablet by mouth 3 (three) times daily.   dorzolamide-timolol 22.3-6.8 MG/ML ophthalmic solution Commonly known as:  COSOPT Place 1 drop into the left eye 2 (two) times daily.   finasteride 5 MG tablet Commonly known as:  PROSCAR Take 5 mg by mouth daily.   furosemide 20 MG tablet Commonly known as:  LASIX Take 20 mg by mouth daily as needed for edema.   guaiFENesin 600 MG 12 hr tablet Commonly known as:  MUCINEX Take 600 mg by mouth 2 (two) times daily as needed for to loosen phlegm.   HYDROcodone-acetaminophen 5-325 MG tablet Commonly known as:  NORCO/VICODIN Take 1 tablet by mouth every 6 (six) hours as needed for moderate pain.   latanoprost 0.005 % ophthalmic solution Commonly known as:  XALATAN Place 1 drop into both eyes at bedtime.   naproxen sodium 220 MG tablet Commonly known as:  ALEVE Take 220 mg by mouth daily.   polyethylene glycol powder powder Commonly known as:  GLYCOLAX/MIRALAX Take 17 g by mouth daily.       Vitals:   03/11/18 1201 03/11/18 1348  BP: (!) 170/83 (!) 158/75  Pulse: 72 68  Resp: 18 16  Temp: 98.2 F (36.8 C) 97.8 F (36.6 C)  SpO2: 97% 98%    Francesco Sor

## 2018-03-11 NOTE — Progress Notes (Signed)
RN call report to [facility]. Report given to [place] LPN.

## 2018-03-11 NOTE — Discharge Summary (Signed)
[facility] NAME: Gavin Garcia    MR#:  [id number]  DATE OF BIRTH:  Jul 21, 1927  DATE OF ADMISSION:  03/07/2018 ADMITTING PHYSICIAN: Harrie Foreman, MD  DATE OF DISCHARGE: 03/11/2018  PRIMARY CARE PHYSICIAN: Derinda Late, MD    ADMISSION DIAGNOSIS:  weaikness to lower extremity  DISCHARGE DIAGNOSIS:  Active Problems:   Lower extremity weakness   SECONDARY DIAGNOSIS:   Past Medical History:  Diagnosis Date  . Basal cell carcinoma   . Glaucoma (increased eye pressure)   . Osteoporosis   . Prostate enlargement     HOSPITAL COURSE:   84 year old male with past medical history of glaucoma, Parkinson's disease, osteoporosis, status post recent fall who presents to the hospital due to bilateral lower extremity weakness and difficulty ambulating.  1.  Bilateral hip flexor weakness/Difficulty Ambulating- this is secondary to progressive Parkinson's disease and also combined with his recent fall and now developing bilateral sacral fractures. -Patient has been evaluated by neurology, orthopedics and neurosurgery while in the hospital.  Patient underwent a CT of the lumbar spine and pelvis which showed sacral fractures and also showed spinal stenosis. - As per neurosurgery they wanted to do an MRI with patient refused and given his multiple comorbidities he would likely not be a good surgical candidate for sacral plasty.   - Patient received physical therapy and the recommend short-term rehab which is where he is presently being discharged.  His pain is somewhat controlled on Tylenol, and I will add some low-dose Norco to help with his pain control so he can work with physical therapy. - Patient is being discharged to short-term rehab for further care and family is in agreement.  2.  Status post fall and bilateral sacral fractures- Orthopedics consult obtained.  Seen by Dr. Mack Guise and no plans for surgical intervention.   -Patient will continue pain control with Tylenol, Norco and cont. PT as tolerated.   3.  History of Parkinson's disease-continue Sinemet. - appreciate Neuro input and pt. Likely has progressive decline of his PD. Cont. Current meds for now.   4.  BPH-continue finasteride.  5.  Glaucoma-continue dorzolamide/timolol eyedrops.  DISCHARGE CONDITIONS:   Stable.   CONSULTS OBTAINED:  Treatment Team:  Alexis Goodell, MD [place], MD Meade Maw, MD  DRUG ALLERGIES:   Allergies  Allergen Reactions  . Codeine Nausea And Vomiting    DISCHARGE MEDICATIONS:   Allergies as of 03/11/2018      Reactions   Codeine Nausea And Vomiting      Medication List    STOP taking these medications   doxycycline 20 MG tablet Commonly known as:  PERIOSTAT     TAKE these medications   acetaminophen 325 MG tablet Commonly known as:  TYLENOL Take 2 tablets (650 mg total) by mouth every 6 (six) hours as needed for mild pain (or Fever >/= 101).   aspirin EC 81 MG tablet Take 81 mg by mouth daily.   carbidopa-levodopa 25-250 MG tablet Commonly known as:  SINEMET IR Take 1 tablet by mouth 3 (three) times daily.   dorzolamide-timolol 22.3-6.8 MG/ML ophthalmic solution Commonly known as:  COSOPT Place 1 drop into the left eye 2 (two) times daily.   finasteride 5 MG tablet Commonly known as:  PROSCAR Take 5 mg by mouth daily.   furosemide 20 MG tablet Commonly known as:  LASIX Take 20 mg by mouth daily as needed for edema.   guaiFENesin 600 MG 12  hr tablet Commonly known as:  MUCINEX Take 600 mg by mouth 2 (two) times daily as needed for to loosen phlegm.   HYDROcodone-acetaminophen 5-325 MG tablet Commonly known as:  NORCO/VICODIN Take 1 tablet by mouth every 6 (six) hours as needed for moderate pain.   latanoprost 0.005 % ophthalmic solution Commonly known as:  XALATAN Place 1 drop into both eyes at bedtime.   naproxen sodium 220 MG tablet Commonly known  as:  ALEVE Take 220 mg by mouth daily.   polyethylene glycol powder powder Commonly known as:  GLYCOLAX/MIRALAX Take 17 g by mouth daily.         DISCHARGE INSTRUCTIONS:   DIET:  Regular diet  DISCHARGE CONDITION:  Stable  ACTIVITY:  Activity as tolerated  OXYGEN:  Home Oxygen: No.   Oxygen Delivery: room air  DISCHARGE LOCATION:  nursing home   If you experience worsening of your admission symptoms, develop shortness of breath, life threatening emergency, suicidal or homicidal thoughts you must seek medical attention immediately by calling 911 or calling your MD immediately  if symptoms less severe.  You Must read complete instructions/literature along with all the possible adverse reactions/side effects for all the Medicines you take and that have been prescribed to you. Take any new Medicines after you have completely understood and accpet all the possible adverse reactions/side effects.   Please note  You were cared for by a hospitalist during your hospital stay. If you have any questions about your discharge medications or the care you received while you were in the hospital after you are discharged, you can call the unit and asked to speak with the hospitalist on call if the hospitalist that took care of you is not available. Once you are discharged, your primary care physician will handle any further medical issues. Please note that NO REFILLS for any discharge medications will be authorized once you are discharged, as it is imperative that you return to your primary care physician (or establish a relationship with a primary care physician if you do not have one) for your aftercare needs so that they can reassess your need for medications and monitor your lab values.     Today   Pt. Still having some lower back/Hip pain.  Received authorization for SNF/STR and will discharge there today. Family in agreement.   VITAL SIGNS:  Blood pressure (!) 151/78, pulse 74,  temperature (!) 97.4 F (36.3 C), temperature source Oral, resp. rate 16, height 5\' 8"  (1.727 m), weight 85.3 kg, SpO2 95 %.  I/O:    Intake/Output Summary (Last 24 hours) at 03/11/2018 1038 Last data filed at 03/11/2018 0642 Gross per 24 hour  Intake 0 ml  Output 1500 ml  Net -1500 ml    PHYSICAL EXAMINATION:   GENERAL:  84 y.o.-year-old patient lying in bed in no acute distress.  EYES: Pupils equal, round, reactive to light and accommodation. No scleral icterus. Extraocular muscles intact.  HEENT: Head atraumatic, normocephalic. Oropharynx and nasopharynx clear.  NECK:  Supple, no jugular venous distention. No thyroid enlargement, no tenderness.  LUNGS: Normal breath sounds bilaterally, no wheezing, rales, rhonchi. No use of accessory muscles of respiration.  CARDIOVASCULAR: S1, S2 normal. No murmurs, rubs, or gallops.  ABDOMEN: Soft, nontender, nondistended. Bowel sounds present. No organomegaly or mass.  EXTREMITIES: No cyanosis, clubbing or edema b/l.    NEUROLOGIC: Cranial nerves II through XII are intact. No focal Motor or sensory deficits b/l. B/l Hip flexor weakness.  PSYCHIATRIC: The patient is  alert and oriented x 3.  SKIN: No obvious rash, lesion, or ulcer.   DATA REVIEW:   CBC Recent Labs  Lab 03/10/18 0538  WBC 7.7  HGB 13.1  HCT 37.0*  PLT 156    Chemistries  Recent Labs  Lab 03/07/18 2304  NA 138  K 3.8  CL 106  CO2 24  GLUCOSE 121*  BUN 41*  CREATININE 0.80  CALCIUM 9.2    Cardiac Enzymes No results for input(s): TROPONINI in the last 168 hours.  Microbiology Results  Results for orders placed or performed during the hospital encounter of 03/07/18  Urine culture     Status: None   Collection Time: 03/07/18 11:49 PM  Result Value Ref Range Status   Specimen Description   Final    URINE, RANDOM Performed at [facility], [address], [place], [place] [address]    Special Requests   Final    NONE Performed at [facility], [address], [place], [place] [address]    Culture   Final    NO GROWTH Performed at [facility], [place] [address], [place], [place] [address]    Report Status 03/09/2018 FINAL  Final    RADIOLOGY:  Ct Pelvis Wo Contrast  Result Date: 03/10/2018 CLINICAL DATA:  Sacral fractures on prior CT lumbar spine, for further assessment of the bony pelvis EXAM: CT PELVIS WITHOUT CONTRAST TECHNIQUE: Multidetector CT imaging of the pelvis was performed following the standard protocol without intravenous contrast. COMPARISON:  CT lumbar spine 03/08/2018 FINDINGS: Urinary Tract:  Lower urinary tract unremarkable. Bowel:  Sigmoid colon diverticulosis. Vascular/Lymphatic: Aortoiliac atherosclerotic vascular disease. Reproductive:  Mild prostatomegaly. Other: Presacral edema. Trace fluid along the lower paracolic gutters of uncertain etiology. Musculoskeletal: CT confirms vertical fractures of both sacral ala, tracking vertically adjacent to the SI joints, along with a transverse sacral component at the upper S3 vertebral level as shown on image 85/6. No discrete pubic ramus fractures. No other appreciable bony pelvic fractures or hip fracture. Left groin hernia favors a femoral hernia but could be a direct inguinal hernia, and contains adipose tissue. There is some mild nodularity in the herniated adipose tissue potentially reflecting lymph nodes along the left spermatic cord region. There is a smaller direct right inguinal hernia containing adipose tissue. There is spondylosis and degenerative disc disease in the lower lumbar spine, resulting in bilateral foraminal impingement at L5-S1 as well as mild to moderate central narrowing of the thecal sac at L4-5. IMPRESSION: 1. Acute vertical fractures of both sacral ala extending adjacent to the SI joints, with a transverse fracture of the sacrum at the S3 level. No pubic ramus fracture or other pelvic fractures. Appearance most compatible with sacral  insufficiency fracture. 2. Bilateral groin hernias containing primarily adipose tissue. The left hernia, which could be a femoral or a direct inguinal hernia, has some associated small apparent lymph nodes in the herniated adipose tissue. 3. Lower lumbar spondylosis and degenerative disc disease with impingement at L4-5 and L5-S1. 4.  Aortic Atherosclerosis (ICD10-I70.0). 5. Mild prostatomegaly. 6. Sigmoid colon diverticulosis. Electronically Signed   By: Van Clines M.D.   On: 03/10/2018 07:52      Management plans discussed with the patient, family and they are in agreement.  CODE STATUS:     Code Status Orders  (From admission, onward)         Start     Ordered   03/08/18 0349  Full code  Continuous  03/08/18 0348          TOTAL TIME TAKING CARE OF THIS PATIENT: 40 minutes.    Henreitta Leber M.D on 03/11/2018 at 10:38 AM  Between 7am to 6pm - Pager - [phone]  After 6pm go to [email or web address] - Proofreader  [facility]  Office  [phone]  CC: Primary care physician; Derinda Late, MD

## 2018-03-12 DIAGNOSIS — M48061 Spinal stenosis, lumbar region without neurogenic claudication: Secondary | ICD-10-CM | POA: Diagnosis not present

## 2018-03-12 DIAGNOSIS — F0280 Dementia in other diseases classified elsewhere without behavioral disturbance: Secondary | ICD-10-CM | POA: Diagnosis not present

## 2018-03-12 DIAGNOSIS — S3210XA Unspecified fracture of sacrum, initial encounter for closed fracture: Secondary | ICD-10-CM | POA: Diagnosis not present

## 2018-03-12 DIAGNOSIS — G20 Parkinson's disease: Secondary | ICD-10-CM | POA: Diagnosis not present

## 2018-03-15 ENCOUNTER — Other Ambulatory Visit: Payer: Self-pay

## 2018-03-15 MED ORDER — HYDROCODONE-ACETAMINOPHEN 5-325 MG PO TABS: 1.0000 | ORAL_TABLET | Freq: Four times a day (QID) | ORAL | 0 refills | Status: DC | PRN

## 2018-03-15 NOTE — Telephone Encounter (Signed)
Rx sent to [facility] phone : [phone] , fax : [phone]  

## 2018-03-16 ENCOUNTER — Encounter
Admission: RE | Admit: 2018-03-16 | Discharge: 2018-03-16 | Disposition: A | Discharge status: Home / Self Care | Payer: Medicare HMO | Source: Ambulatory Visit | Attending: Internal Medicine | Admitting: Internal Medicine

## 2018-03-24 DIAGNOSIS — F0280 Dementia in other diseases classified elsewhere without behavioral disturbance: Secondary | ICD-10-CM | POA: Diagnosis not present

## 2018-03-24 DIAGNOSIS — R2689 Other abnormalities of gait and mobility: Secondary | ICD-10-CM | POA: Diagnosis not present

## 2018-03-24 DIAGNOSIS — G20 Parkinson's disease: Secondary | ICD-10-CM | POA: Diagnosis not present

## 2018-03-29 DIAGNOSIS — S3210XD Unspecified fracture of sacrum, subsequent encounter for fracture with routine healing: Secondary | ICD-10-CM | POA: Diagnosis not present

## 2018-03-29 DIAGNOSIS — M25572 Pain in left ankle and joints of left foot: Secondary | ICD-10-CM | POA: Diagnosis not present

## 2018-03-30 DIAGNOSIS — G20 Parkinson's disease: Secondary | ICD-10-CM | POA: Diagnosis not present

## 2018-03-30 DIAGNOSIS — F0280 Dementia in other diseases classified elsewhere without behavioral disturbance: Secondary | ICD-10-CM | POA: Diagnosis not present

## 2018-03-30 DIAGNOSIS — H8110 Benign paroxysmal vertigo, unspecified ear: Secondary | ICD-10-CM | POA: Diagnosis not present

## 2018-03-30 DIAGNOSIS — S3210XD Unspecified fracture of sacrum, subsequent encounter for fracture with routine healing: Secondary | ICD-10-CM | POA: Diagnosis not present

## 2018-04-08 ENCOUNTER — Non-Acute Institutional Stay (SKILLED_NURSING_FACILITY): Payer: Medicare HMO | Admitting: Adult Health

## 2018-04-08 ENCOUNTER — Encounter: Payer: Self-pay | Admitting: Adult Health

## 2018-04-08 DIAGNOSIS — I959 Hypotension, unspecified: Secondary | ICD-10-CM | POA: Diagnosis not present

## 2018-04-08 DIAGNOSIS — R1084 Generalized abdominal pain: Secondary | ICD-10-CM

## 2018-04-08 DIAGNOSIS — G20 Parkinson's disease: Secondary | ICD-10-CM

## 2018-04-08 NOTE — Progress Notes (Signed)
Location:  [facility] Room Number: 333-P Place of Service:  SNF (31) Provider:  Durenda Age, NP  Patient Care Team: Derinda Late, MD as PCP - General (Family Medicine)  Extended Emergency Contact Information Primary Emergency Contact: Selinda Michaels Address: [address]          [place], [place] [address] Home Phone: [phone] Relation: Spouse  Code Status:  DNR  Goals of care: Advanced Directive information Advanced Directives 03/08/2018  Does Patient Have a Medical Advance Directive? Yes  Type of Paramedic of [place];Living will  Does patient want to make changes to medical advance directive? No - Patient declined  Copy of [place] in Chart? Yes  Would patient like information on creating a medical advance directive? No - Patient declined     Chief Complaint  Patient presents with  . Acute Visit    Patient with weakness, decreased blood pressures    HPI:  Pt is an 84 y.o. male seen today for a routine [facility] visit.  He has a PMH of Parkinson's disease, spinal stenosis, dementia, BPH, repeated falls, and essential hypertension. BPs 100/67, 144/63, 98/50, 144/60.  He does not complain of dizziness.  He was seen in his room with wife at bedside. He verbalized having occasional generalized abdominal pain. He denies having constipation.  Past Medical History:  Diagnosis Date  . Basal cell carcinoma   . Glaucoma (increased eye pressure)   . Osteoporosis   . Prostate enlargement    History reviewed. No pertinent surgical history.  Allergies  Allergen Reactions  . Codeine Nausea And Vomiting    Outpatient Encounter Medications as of 04/08/2018  Medication Sig  . acetaminophen (TYLENOL) 325 MG tablet Take 2 tablets (650 mg total) by mouth every 6 (six) hours as needed for mild pain (or Fever >/= 101).  Marland Kitchen aspirin EC 81 MG tablet Take 81 mg by mouth daily.  .  carbidopa-levodopa (SINEMET IR) 25-250 MG tablet Take 1 tablet by mouth 3 (three) times daily.  . dorzolamide-timolol (COSOPT) 22.3-6.8 MG/ML ophthalmic solution Place 1 drop into the left eye 2 (two) times daily.   Marland Kitchen doxycycline (PERIOSTAT) 20 MG tablet Take 20 mg by mouth daily.  . finasteride (PROSCAR) 5 MG tablet Take 5 mg by mouth daily.  . furosemide (LASIX) 20 MG tablet Take 20 mg by mouth daily as needed for edema.   Marland Kitchen guaiFENesin (MUCINEX) 600 MG 12 hr tablet Take 600 mg by mouth 2 (two) times daily as needed for to loosen phlegm.  Marland Kitchen HYDROcodone-acetaminophen (NORCO) 5-325 MG tablet Take 1 tablet by mouth every 6 (six) hours as needed for moderate pain.  . Infant Care Products (DERMACLOUD) CREA Apply 1 application topically daily as needed. Apply liberal amount to rea of skin irritation prn.  . latanoprost (XALATAN) 0.005 % ophthalmic solution Place 1 drop into both eyes at bedtime.   . meclizine (ANTIVERT) 25 MG tablet Take 25 mg by mouth every 6 (six) hours as needed for dizziness.  . naproxen sodium (ALEVE) 220 MG tablet Take 220 mg by mouth daily.  . ondansetron (ZOFRAN) 4 MG tablet Take 4 mg by mouth every 6 (six) hours as needed for nausea or vomiting.  . polyethylene glycol powder (GLYCOLAX/MIRALAX) powder Take 17 g by mouth daily.   No facility-administered encounter medications on file as of 04/08/2018.     Review of Systems  GENERAL: No change in appetite, no fatigue, no weight changes, no  fever, chills or weakness MOUTH and THROAT: Denies oral discomfort, gingival pain or bleeding RESPIRATORY: no cough, SOB, DOE, wheezing, hemoptysis CARDIAC: No chest pain, edema or palpitations GI: No abdominal pain, diarrhea, constipation, heart burn, nausea or vomiting PSYCHIATRIC: Denies feelings of depression or anxiety. No report of hallucinations, insomnia, paranoia, or agitation   There is no immunization history on file for this patient. Pertinent  Health Maintenance Due    Topic Date Due  . PNA vac Low Risk Adult (1 of 2 - PCV13) 08/09/1994  . INFLUENZA VACCINE  01/14/2018     Vitals:   04/08/18 1048  BP: 126/75  Pulse: 84  Resp: 18  Temp: 98.7 F (37.1 C)  TempSrc: Oral  SpO2: 96%  Weight: 174 lb 12.8 oz (79.3 kg)  Height: 5\' 8"  (1.727 m)   Body mass index is 26.58 kg/m.  Physical Exam  GENERAL APPEARANCE: Well nourished. In no acute distress. Normal body habitus SKIN:  Skin is warm and dry.  MOUTH and THROAT: Lips are without lesions. Oral mucosa is moist and without lesions. Tongue is normal in shape, size, and color and without lesions RESPIRATORY: Breathing is even & unlabored, BS CTAB CARDIAC: RRR, no murmur,no extra heart sounds, no edema GI: Abdomen soft, normal BS, no masses, no tenderness EXTREMITIES: Able to move x4 extremities NEUROLOGICAL: There is no tremor. Speech is clear PSYCHIATRIC: Alert and oriented X 3. Affect and behavior are appropriate  Labs reviewed: Recent Labs    03/07/18 2304  NA 138  K 3.8  CL 106  CO2 24  GLUCOSE 121*  BUN 41*  CREATININE 0.80  CALCIUM 9.2    Recent Labs    03/07/18 2304 03/10/18 0538  WBC 11.2* 7.7  HGB 13.5 13.1  HCT 38.8* 37.0*  MCV 91.6 90.7  PLT 158 156   Lab Results  Component Value Date   TSH 2.321 03/07/2018    Significant Diagnostic Results in last 30 days:  Ct Pelvis Wo Contrast  Result Date: 03/10/2018 CLINICAL DATA:  Sacral fractures on prior CT lumbar spine, for further assessment of the bony pelvis EXAM: CT PELVIS WITHOUT CONTRAST TECHNIQUE: Multidetector CT imaging of the pelvis was performed following the standard protocol without intravenous contrast. COMPARISON:  CT lumbar spine 03/08/2018 FINDINGS: Urinary Tract:  Lower urinary tract unremarkable. Bowel:  Sigmoid colon diverticulosis. Vascular/Lymphatic: Aortoiliac atherosclerotic vascular disease. Reproductive:  Mild prostatomegaly. Other: Presacral edema. Trace fluid along the lower paracolic gutters  of uncertain etiology. Musculoskeletal: CT confirms vertical fractures of both sacral ala, tracking vertically adjacent to the SI joints, along with a transverse sacral component at the upper S3 vertebral level as shown on image 85/6. No discrete pubic ramus fractures. No other appreciable bony pelvic fractures or hip fracture. Left groin hernia favors a femoral hernia but could be a direct inguinal hernia, and contains adipose tissue. There is some mild nodularity in the herniated adipose tissue potentially reflecting lymph nodes along the left spermatic cord region. There is a smaller direct right inguinal hernia containing adipose tissue. There is spondylosis and degenerative disc disease in the lower lumbar spine, resulting in bilateral foraminal impingement at L5-S1 as well as mild to moderate central narrowing of the thecal sac at L4-5. IMPRESSION: 1. Acute vertical fractures of both sacral ala extending adjacent to the SI joints, with a transverse fracture of the sacrum at the S3 level. No pubic ramus fracture or other pelvic fractures. Appearance most compatible with sacral insufficiency fracture. 2. Bilateral groin hernias containing  primarily adipose tissue. The left hernia, which could be a femoral or a direct inguinal hernia, has some associated small apparent lymph nodes in the herniated adipose tissue. 3. Lower lumbar spondylosis and degenerative disc disease with impingement at L4-5 and L5-S1. 4.  Aortic Atherosclerosis (ICD10-I70.0). 5. Mild prostatomegaly. 6. Sigmoid colon diverticulosis. Electronically Signed   By: Van Clines M.D.   On: 03/10/2018 07:52    Assessment/Plan  1. Hypotension, unspecified hypotension type - continue to use  TED stockings, BP BID X 1 week  2. Parkinson disease ([place]) -continue carbidopa-levodopa 25-250 mg 1 tab 3 times a day   3. Generalized abdominal pain - will discontinue Naproxen, avoid NSAIDS    Family/ staff Communication: Discussed plan of  care with wife and resident.  Labs/tests ordered:  None  Goals of care:   Short-term rehabilitation.   Durenda Age, NP [facility] [phone] (Monday-Friday 8:00 a.m. - 5:00 p.m.) [phone] (after hours)

## 2018-04-16 ENCOUNTER — Non-Acute Institutional Stay (SKILLED_NURSING_FACILITY): Payer: Medicare HMO | Admitting: Adult Health

## 2018-04-16 ENCOUNTER — Other Ambulatory Visit: Payer: Self-pay

## 2018-04-16 ENCOUNTER — Encounter: Payer: Self-pay | Admitting: Adult Health

## 2018-04-16 ENCOUNTER — Encounter: Admission: RE | Admit: 2018-04-16 | Payer: Medicare HMO | Source: Ambulatory Visit | Admitting: Internal Medicine

## 2018-04-16 ENCOUNTER — Other Ambulatory Visit: Payer: Self-pay | Admitting: Adult Health

## 2018-04-16 DIAGNOSIS — S3210XS Unspecified fracture of sacrum, sequela: Secondary | ICD-10-CM

## 2018-04-16 DIAGNOSIS — R29898 Other symptoms and signs involving the musculoskeletal system: Secondary | ICD-10-CM

## 2018-04-16 DIAGNOSIS — G20A1 Parkinson's disease without dyskinesia, without mention of fluctuations: Secondary | ICD-10-CM

## 2018-04-16 DIAGNOSIS — S3210XA Unspecified fracture of sacrum, initial encounter for closed fracture: Secondary | ICD-10-CM | POA: Insufficient documentation

## 2018-04-16 DIAGNOSIS — G20 Parkinson's disease: Secondary | ICD-10-CM | POA: Diagnosis not present

## 2018-04-16 MED ORDER — ONDANSETRON HCL 4 MG PO TABS: 4.0000 mg | ORAL_TABLET | Freq: Four times a day (QID) | ORAL | 0 refills | Status: DC | PRN

## 2018-04-16 MED ORDER — HYDROCODONE-ACETAMINOPHEN 5-325 MG PO TABS: 1.0000 | ORAL_TABLET | Freq: Four times a day (QID) | ORAL | 0 refills | Status: DC | PRN

## 2018-04-16 MED ORDER — CARBIDOPA-LEVODOPA 25-250 MG PO TABS: 1.0000 | ORAL_TABLET | Freq: Three times a day (TID) | ORAL | 0 refills | Status: DC

## 2018-04-16 MED ORDER — MECLIZINE HCL 25 MG PO TABS: 25.0000 mg | ORAL_TABLET | Freq: Four times a day (QID) | ORAL | 0 refills | Status: DC | PRN

## 2018-04-16 MED ORDER — FUROSEMIDE 20 MG PO TABS: 20.0000 mg | ORAL_TABLET | Freq: Every day | ORAL | 0 refills | Status: DC | PRN

## 2018-04-16 MED ORDER — DORZOLAMIDE HCL-TIMOLOL MAL 2-0.5 % OP SOLN: 1.0000 [drp] | Freq: Two times a day (BID) | OPHTHALMIC | 0 refills | Status: DC

## 2018-04-16 MED ORDER — DOXYCYCLINE HYCLATE 20 MG PO TABS: 20.0000 mg | ORAL_TABLET | Freq: Every day | ORAL | 0 refills | Status: DC

## 2018-04-16 MED ORDER — FINASTERIDE 5 MG PO TABS: 5.0000 mg | ORAL_TABLET | Freq: Every day | ORAL | 0 refills | Status: DC

## 2018-04-16 MED ORDER — LATANOPROST 0.005 % OP SOLN: 1.0000 [drp] | Freq: Every day | OPHTHALMIC | 0 refills | Status: DC

## 2018-04-16 NOTE — Progress Notes (Signed)
[facility]:   [facility] Room Number: [place] of Service:  SNF (31)    CODE STATUS: DNR  Allergies  Allergen Reactions  . Codeine Nausea And Vomiting    Chief Complaint  Patient presents with  . Discharge Note    Discharging on 04/17/18    HPI:  He is being discharged to home with home health for pt/ot. He will need a wheelchair. He will need his prescriptions written and will need to follow up with his medical provider. He was hospitalized for lower extremity weakness due to parkinsons disease. He was found to have sacral fractures and spinal stenosis. He was admitted to this facility and is ready for discharge to home.    Past Medical History:  Diagnosis Date  . Basal cell carcinoma   . Glaucoma (increased eye pressure)   . Osteoporosis   . Prostate enlargement     History reviewed. No pertinent surgical history.  Social History   Socioeconomic History  . Marital status: Married    Spouse name: Not on file  . Number of children: Not on file  . Years of education: Not on file  . Highest education level: Not on file  Occupational History  . Not on file  Social Needs  . Financial resource strain: Not on file  . Food insecurity:    Worry: Not on file    Inability: Not on file  . Transportation needs:    Medical: Not on file    Non-medical: Not on file  Tobacco Use  . Smoking status: Never Smoker  . Smokeless tobacco: Never Used  Substance and Sexual Activity  . Alcohol use: No  . Drug use: Not on file  . Sexual activity: Not on file  Lifestyle  . Physical activity:    Days per week: Not on file    Minutes per session: Not on file  . Stress: Not on file  Relationships  . Social connections:    Talks on phone: Not on file    Gets together: Not on file    Attends religious service: Not on file    Active member of club or organization: Not on file    Attends meetings of clubs or organizations: Not on file    Relationship  status: Not on file  . Intimate partner violence:    Fear of current or ex partner: Not on file    Emotionally abused: Not on file    Physically abused: Not on file    Forced sexual activity: Not on file  Other Topics Concern  . Not on file  Social History Narrative  . Not on file   History reviewed. No pertinent family history.  VITAL SIGNS BP (!) 86/57   Pulse 86   Temp 98 F (36.7 C)   Resp 16   Ht 5\' 8"  (1.727 m)   Wt 174 lb 12.8 oz (79.3 kg)   SpO2 96%   BMI 26.58 kg/m   Patient's Medications  New Prescriptions   No medications on file  Previous Medications   ACETAMINOPHEN (TYLENOL) 325 MG TABLET    Take 2 tablets (650 mg total) by mouth every 6 (six) hours as needed for mild pain (or Fever >/= 101).   ASPIRIN EC 81 MG TABLET    Take 81 mg by mouth daily.   CARBIDOPA-LEVODOPA (SINEMET IR) 25-250 MG TABLET    Take 1 tablet by mouth 3 (three) times daily.   DORZOLAMIDE-TIMOLOL (COSOPT)  22.3-6.8 MG/ML OPHTHALMIC SOLUTION    Place 1 drop into the left eye 2 (two) times daily.    DOXYCYCLINE (PERIOSTAT) 20 MG TABLET    Take 20 mg by mouth daily.   FINASTERIDE (PROSCAR) 5 MG TABLET    Take 5 mg by mouth daily.   FUROSEMIDE (LASIX) 20 MG TABLET    Take 20 mg by mouth daily as needed for edema.    GUAIFENESIN (MUCINEX) 600 MG 12 HR TABLET    Take 600 mg by mouth 2 (two) times daily as needed for to loosen phlegm.   HYDROCODONE-ACETAMINOPHEN (NORCO) 5-325 MG TABLET    Take 1 tablet by mouth every 6 (six) hours as needed for moderate pain.   INFANT CARE PRODUCTS (DERMACLOUD) CREA    Apply 1 application topically daily as needed. Apply liberal amount to rea of skin irritation prn.   LATANOPROST (XALATAN) 0.005 % OPHTHALMIC SOLUTION    Place 1 drop into both eyes at bedtime.    MECLIZINE (ANTIVERT) 25 MG TABLET    Take 25 mg by mouth every 6 (six) hours as needed for dizziness.   NON FORMULARY    Diet Type: Regular Heart Healthy Diet   ONDANSETRON (ZOFRAN) 4 MG TABLET    Take 4 mg  by mouth every 6 (six) hours as needed for nausea or vomiting.   POLYETHYLENE GLYCOL POWDER (GLYCOLAX/MIRALAX) POWDER    Take 17 g by mouth daily.  Modified Medications   No medications on file  Discontinued Medications   NAPROXEN SODIUM (ALEVE) 220 MG TABLET    Take 220 mg by mouth daily.     SIGNIFICANT DIAGNOSTIC EXAMS  TODAY;   03-10-18: ct of abdomen and pelvis: 1. Acute vertical fractures of both sacral ala extending adjacent to the SI joints, with a transverse fracture of the sacrum at the S3 level. No pubic ramus fracture or other pelvic fractures. Appearance most compatible with sacral insufficiency fracture. 2. Bilateral groin hernias containing primarily adipose tissue. The left hernia, which could be a femoral or a direct inguinal hernia, has some associated small apparent lymph nodes in the herniated adipose tissue. 3. Lower lumbar spondylosis and degenerative disc disease with impingement at L4-5 and L5-S1. 4.  Aortic Atherosclerosis (ICD10-I70.0). 5. Mild prostatomegaly. 6. Sigmoid colon diverticulosis.   Review of Systems  Constitutional: Negative for malaise/fatigue.  Respiratory: Negative for cough and shortness of breath.   Cardiovascular: Negative for chest pain, palpitations and leg swelling.  Gastrointestinal: Negative for abdominal pain, constipation and heartburn.  Musculoskeletal: Negative for back pain, joint pain and myalgias.  Skin: Negative.   Neurological: Negative for dizziness.  Psychiatric/Behavioral: The patient is not nervous/anxious.    Physical Exam  Constitutional: He is oriented to person, place, and time. He appears well-developed and well-nourished. No distress.  Neck: No thyromegaly present.  Cardiovascular: Normal rate, regular rhythm, normal heart sounds and intact distal pulses.  Pulmonary/Chest: Effort normal and breath sounds normal. No respiratory distress.  Abdominal: Soft. Bowel sounds are normal. He exhibits no distension. There  is no tenderness.  Musculoskeletal: He exhibits no edema.  Is able to move all extremities Has kyphosis   Lymphadenopathy:    He has no cervical adenopathy.  Neurological: He is alert and oriented to person, place, and time.  Skin: Skin is warm and dry. He is not diaphoretic.  Psychiatric: He has a normal mood and affect.      ASSESSMENT/ PLAN:  Patient is being discharged with the following home health services:  Pt/ot: to evaluate and treat as indicated for gait balance strength adl training.   Patient is being discharged with the following durable medical equipment:  Standard wheelchair with elevated leg rests; cushion; anti-tippers; brake extensions to allow patient to maintain current level of independence with adls which cannot be achieved with a walker can self propel  Patient has been advised to f/u with their PCP in 1-2 weeks to bring them up to date on their rehab stay.  Social services at facility was responsible for arranging this appointment.  Pt was provided with a 30 day supply of prescriptions for medications and refills must be obtained from their PCP.  For controlled substances, a more limited supply may be provided adequate until PCP appointment only.   A 30 day supply of his medications have been sent electronically to [id number] #20 vicodin 5/325 mg tabs   Time spent with patient: 40 minutes: discussed medications; DME and home health needs; verbalized understanding.     Ok Edwards NP [facility]  Contact [phone] Monday through Friday 8am- 5pm  After hours call [phone]

## 2018-04-18 DIAGNOSIS — F0280 Dementia in other diseases classified elsewhere without behavioral disturbance: Secondary | ICD-10-CM | POA: Diagnosis not present

## 2018-04-18 DIAGNOSIS — M48061 Spinal stenosis, lumbar region without neurogenic claudication: Secondary | ICD-10-CM | POA: Diagnosis not present

## 2018-04-18 DIAGNOSIS — G20 Parkinson's disease: Secondary | ICD-10-CM | POA: Diagnosis not present

## 2018-04-18 DIAGNOSIS — M5136 Other intervertebral disc degeneration, lumbar region: Secondary | ICD-10-CM | POA: Diagnosis not present

## 2018-04-18 DIAGNOSIS — M8008XD Age-related osteoporosis with current pathological fracture, vertebra(e), subsequent encounter for fracture with routine healing: Secondary | ICD-10-CM | POA: Diagnosis not present

## 2018-04-30 DIAGNOSIS — I872 Venous insufficiency (chronic) (peripheral): Secondary | ICD-10-CM | POA: Diagnosis not present

## 2018-04-30 DIAGNOSIS — R238 Other skin changes: Secondary | ICD-10-CM | POA: Diagnosis not present

## 2018-05-07 DIAGNOSIS — M48061 Spinal stenosis, lumbar region without neurogenic claudication: Secondary | ICD-10-CM | POA: Diagnosis not present

## 2018-05-07 DIAGNOSIS — G20 Parkinson's disease: Secondary | ICD-10-CM | POA: Diagnosis not present

## 2018-05-07 DIAGNOSIS — M8008XD Age-related osteoporosis with current pathological fracture, vertebra(e), subsequent encounter for fracture with routine healing: Secondary | ICD-10-CM | POA: Diagnosis not present

## 2018-05-07 DIAGNOSIS — M5136 Other intervertebral disc degeneration, lumbar region: Secondary | ICD-10-CM | POA: Diagnosis not present

## 2018-05-07 DIAGNOSIS — F0280 Dementia in other diseases classified elsewhere without behavioral disturbance: Secondary | ICD-10-CM | POA: Diagnosis not present

## 2018-05-16 DIAGNOSIS — M8008XD Age-related osteoporosis with current pathological fracture, vertebra(e), subsequent encounter for fracture with routine healing: Secondary | ICD-10-CM | POA: Diagnosis not present

## 2018-05-16 DIAGNOSIS — G20 Parkinson's disease: Secondary | ICD-10-CM | POA: Diagnosis not present

## 2018-05-16 DIAGNOSIS — F0280 Dementia in other diseases classified elsewhere without behavioral disturbance: Secondary | ICD-10-CM | POA: Diagnosis not present

## 2018-05-16 DIAGNOSIS — M48061 Spinal stenosis, lumbar region without neurogenic claudication: Secondary | ICD-10-CM | POA: Diagnosis not present

## 2018-05-16 DIAGNOSIS — M5136 Other intervertebral disc degeneration, lumbar region: Secondary | ICD-10-CM | POA: Diagnosis not present

## 2018-05-26 DIAGNOSIS — S3210XD Unspecified fracture of sacrum, subsequent encounter for fracture with routine healing: Secondary | ICD-10-CM | POA: Diagnosis not present

## 2018-05-31 DIAGNOSIS — H401123 Primary open-angle glaucoma, left eye, severe stage: Secondary | ICD-10-CM | POA: Diagnosis not present

## 2019-02-03 ENCOUNTER — Observation Stay
Admission: EM | Admit: 2019-02-03 | Discharge: 2019-02-04 | Disposition: A | Discharge status: Home / Self Care | Payer: Medicare HMO | Attending: Internal Medicine | Admitting: Internal Medicine

## 2019-02-03 ENCOUNTER — Other Ambulatory Visit: Payer: Self-pay

## 2019-02-03 ENCOUNTER — Emergency Department: Payer: Medicare HMO

## 2019-02-03 ENCOUNTER — Encounter: Payer: Self-pay | Admitting: Intensive Care

## 2019-02-03 ENCOUNTER — Observation Stay: Payer: Medicare HMO

## 2019-02-03 DIAGNOSIS — F329 Major depressive disorder, single episode, unspecified: Secondary | ICD-10-CM | POA: Diagnosis not present

## 2019-02-03 DIAGNOSIS — Z66 Do not resuscitate: Secondary | ICD-10-CM | POA: Diagnosis not present

## 2019-02-03 DIAGNOSIS — Z79899 Other long term (current) drug therapy: Secondary | ICD-10-CM | POA: Diagnosis not present

## 2019-02-03 DIAGNOSIS — G20 Parkinson's disease: Secondary | ICD-10-CM | POA: Insufficient documentation

## 2019-02-03 DIAGNOSIS — R4701 Aphasia: Secondary | ICD-10-CM | POA: Diagnosis present

## 2019-02-03 DIAGNOSIS — I10 Essential (primary) hypertension: Secondary | ICD-10-CM | POA: Diagnosis not present

## 2019-02-03 DIAGNOSIS — Z20828 Contact with and (suspected) exposure to other viral communicable diseases: Secondary | ICD-10-CM | POA: Diagnosis not present

## 2019-02-03 DIAGNOSIS — M810 Age-related osteoporosis without current pathological fracture: Secondary | ICD-10-CM | POA: Insufficient documentation

## 2019-02-03 DIAGNOSIS — Z85828 Personal history of other malignant neoplasm of skin: Secondary | ICD-10-CM | POA: Diagnosis not present

## 2019-02-03 DIAGNOSIS — N400 Enlarged prostate without lower urinary tract symptoms: Secondary | ICD-10-CM | POA: Diagnosis not present

## 2019-02-03 DIAGNOSIS — H409 Unspecified glaucoma: Secondary | ICD-10-CM | POA: Diagnosis not present

## 2019-02-03 DIAGNOSIS — G459 Transient cerebral ischemic attack, unspecified: Principal | ICD-10-CM | POA: Insufficient documentation

## 2019-02-03 DIAGNOSIS — Z7982 Long term (current) use of aspirin: Secondary | ICD-10-CM | POA: Insufficient documentation

## 2019-02-03 DIAGNOSIS — I639 Cerebral infarction, unspecified: Secondary | ICD-10-CM

## 2019-02-03 HISTORY — DX: Parkinson's disease: G20

## 2019-02-03 HISTORY — DX: Parkinson's disease without dyskinesia, without mention of fluctuations: G20.A1

## 2019-02-03 LAB — GLUCOSE, CAPILLARY: Glucose-Capillary: 101 mg/dL — ABNORMAL HIGH (ref 70–99)

## 2019-02-03 LAB — CBC WITH DIFFERENTIAL/PLATELET
Abs Immature Granulocytes: 0.03 10*3/uL (ref 0.00–0.07)
Basophils Absolute: 0 10*3/uL (ref 0.0–0.1)
Basophils Relative: 1 %
Eosinophils Absolute: 0.2 10*3/uL (ref 0.0–0.5)
Eosinophils Relative: 3 %
HCT: 41.3 % (ref 39.0–52.0)
Hemoglobin: 13.2 g/dL (ref 13.0–17.0)
Immature Granulocytes: 0 %
Lymphocytes Relative: 17 %
Lymphs Abs: 1.3 10*3/uL (ref 0.7–4.0)
MCH: 30.5 pg (ref 26.0–34.0)
MCHC: 32 g/dL (ref 30.0–36.0)
MCV: 95.4 fL (ref 80.0–100.0)
Monocytes Absolute: 0.6 10*3/uL (ref 0.1–1.0)
Monocytes Relative: 9 %
Neutro Abs: 5.1 10*3/uL (ref 1.7–7.7)
Neutrophils Relative %: 70 %
Platelets: 164 10*3/uL (ref 150–400)
RBC: 4.33 MIL/uL (ref 4.22–5.81)
RDW: 13.6 % (ref 11.5–15.5)
WBC: 7.3 10*3/uL (ref 4.0–10.5)
nRBC: 0 % (ref 0.0–0.2)

## 2019-02-03 LAB — URINALYSIS, COMPLETE (UACMP) WITH MICROSCOPIC
Bacteria, UA: NONE SEEN
Bilirubin Urine: NEGATIVE
Glucose, UA: NEGATIVE mg/dL
Hgb urine dipstick: NEGATIVE
Ketones, ur: NEGATIVE mg/dL
Leukocytes,Ua: NEGATIVE
Nitrite: NEGATIVE
Protein, ur: NEGATIVE mg/dL
Specific Gravity, Urine: 1.013 (ref 1.005–1.030)
Squamous Epithelial / HPF: NONE SEEN (ref 0–5)
pH: 6 (ref 5.0–8.0)

## 2019-02-03 LAB — COMPREHENSIVE METABOLIC PANEL
ALT: 5 U/L (ref 0–44)
AST: 17 U/L (ref 15–41)
Albumin: 3.8 g/dL (ref 3.5–5.0)
Alkaline Phosphatase: 48 U/L (ref 38–126)
Anion gap: 7 (ref 5–15)
BUN: 29 mg/dL — ABNORMAL HIGH (ref 8–23)
CO2: 27 mmol/L (ref 22–32)
Calcium: 9 mg/dL (ref 8.9–10.3)
Chloride: 103 mmol/L (ref 98–111)
Creatinine, Ser: 0.85 mg/dL (ref 0.61–1.24)
GFR calc Af Amer: >60 mL/min (ref >60)
GFR calc non Af Amer: >60 mL/min (ref >60)
Glucose, Bld: 96 mg/dL (ref 70–99)
Potassium: 3.9 mmol/L (ref 3.5–5.1)
Sodium: 137 mmol/L (ref 135–145)
Total Bilirubin: 1.2 mg/dL (ref 0.3–1.2)
Total Protein: 6.6 g/dL (ref 6.5–8.1)

## 2019-02-03 LAB — TROPONIN I (HIGH SENSITIVITY): Troponin I (High Sensitivity): 11 ng/L (ref <18)

## 2019-02-03 MED ORDER — ACETAMINOPHEN 325 MG PO TABS: 650.0000 mg | ORAL_TABLET | ORAL | Status: DC | PRN

## 2019-02-03 MED ORDER — HYDRALAZINE HCL 20 MG/ML IJ SOLN
5.0000 mg | INTRAMUSCULAR | Status: DC | PRN
  Administered 2019-02-03: 5 mg via INTRAVENOUS

## 2019-02-03 MED ORDER — ASPIRIN EC 81 MG PO TBEC
81.0000 mg | DELAYED_RELEASE_TABLET | Freq: Every day | ORAL | Status: DC
  Administered 2019-02-04: 11:00:00 81 mg via ORAL
  Filled 2019-02-03: qty 1

## 2019-02-03 MED ORDER — ATORVASTATIN CALCIUM 20 MG PO TABS: 40.0000 mg | ORAL_TABLET | Freq: Every day | ORAL | Status: DC

## 2019-02-03 MED ORDER — LATANOPROST 0.005 % OP SOLN
1.0000 [drp] | Freq: Every day | OPHTHALMIC | Status: DC
  Administered 2019-02-03: 1 [drp] via OPHTHALMIC
  Filled 2019-02-03: qty 2.5

## 2019-02-03 MED ORDER — STROKE: EARLY STAGES OF RECOVERY BOOK
Freq: Once | Status: AC
  Administered 2019-02-03 (×2)

## 2019-02-03 MED ORDER — ENOXAPARIN SODIUM 40 MG/0.4ML ~~LOC~~ SOLN
40.0000 mg | SUBCUTANEOUS | Status: DC
  Administered 2019-02-03: 40 mg via SUBCUTANEOUS
  Filled 2019-02-03: qty 0.4

## 2019-02-03 MED ORDER — CARBIDOPA-LEVODOPA 25-250 MG PO TABS
1.0000 | ORAL_TABLET | Freq: Three times a day (TID) | ORAL | Status: DC
  Administered 2019-02-03 – 2019-02-04 (×3): 1 via ORAL
  Filled 2019-02-03 (×6): qty 1

## 2019-02-03 MED ORDER — FINASTERIDE 5 MG PO TABS
5.0000 mg | ORAL_TABLET | Freq: Every day | ORAL | Status: DC
  Administered 2019-02-04: 5 mg via ORAL
  Filled 2019-02-03: qty 1

## 2019-02-03 MED ORDER — ACETAMINOPHEN 160 MG/5ML PO SOLN
650.0000 mg | ORAL | Status: DC | PRN
  Filled 2019-02-03: qty 20.3

## 2019-02-03 MED ORDER — ASPIRIN 81 MG PO CHEW
324.0000 mg | CHEWABLE_TABLET | Freq: Once | ORAL | Status: AC
  Administered 2019-02-03: 324 mg via ORAL
  Filled 2019-02-03: qty 4

## 2019-02-03 MED ORDER — DORZOLAMIDE HCL-TIMOLOL MAL 2-0.5 % OP SOLN
1.0000 [drp] | Freq: Two times a day (BID) | OPHTHALMIC | Status: DC
  Administered 2019-02-03 – 2019-02-04 (×2): 1 [drp] via OPHTHALMIC
  Filled 2019-02-03: qty 10

## 2019-02-03 MED ORDER — BUSPIRONE HCL 15 MG PO TABS
7.5000 mg | ORAL_TABLET | Freq: Two times a day (BID) | ORAL | Status: DC
  Administered 2019-02-03 – 2019-02-04 (×2): 7.5 mg via ORAL
  Filled 2019-02-03 (×3): qty 1

## 2019-02-03 MED ORDER — ACETAMINOPHEN 650 MG RE SUPP: 650.0000 mg | RECTAL | Status: DC | PRN

## 2019-02-03 MED ORDER — FUROSEMIDE 20 MG PO TABS
20.0000 mg | ORAL_TABLET | Freq: Every day | ORAL | Status: DC
  Administered 2019-02-04: 11:00:00 20 mg via ORAL
  Filled 2019-02-03: qty 1

## 2019-02-03 MED ORDER — HYDRALAZINE HCL 20 MG/ML IJ SOLN
INTRAMUSCULAR | Status: AC
  Filled 2019-02-03: qty 1

## 2019-02-03 NOTE — ED Notes (Signed)
ED TO INPATIENT HANDOFF REPORT  ED Nurse Name and Phone #: Sam, RN, [phone]  S Name/Age/Gender Gavin Garcia 85 y.o. male Room/Bed: ED36A/ED36A  Code Status   Code Status: DNR  Home/SNF/Other Home Patient oriented to: self, place, time and situation Is this baseline? Yes   Triage Complete: Triage complete  Chief Complaint weakness  Triage Note Patient arrived by EMS from [facility] apartments. Patient c/o polyuria last night. Staff reports this AM speech difficulties after waking up from nap. LKN 0700 per staff. Hx parkinsons   Allergies Allergies  Allergen Reactions  . Codeine Nausea And Vomiting    Level of Care/Admitting Diagnosis ED Disposition    ED Disposition Condition [facility] Area: [place] [100120]  Level of Care: Med-Surg [16]  Covid Evaluation: Asymptomatic Screening Protocol (No Symptoms)  Diagnosis: Expressive aphasia [676195]  Admitting Physician: Hyman Bible DODD [[id number]]  Attending Physician: Hyman Bible DODD [[id number]]  PT Class (Do Not Modify): Observation [104]  PT Acc Code (Do Not Modify): Observation [10022]       B Medical/Surgery History Past Medical History:  Diagnosis Date  . Basal cell carcinoma   . Glaucoma (increased eye pressure)   . Osteoporosis   . Parkinson's disease ([place])   . Prostate enlargement    History reviewed. No pertinent surgical history.   A IV Location/Drains/Wounds Patient Lines/Drains/Airways Status   Active Line/Drains/Airways    Name:   Placement date:   Placement time:   Site:   Days:   Peripheral IV 02/03/19 Left Wrist   02/03/19    1218    Wrist   less than 1          Intake/Output Last 24 hours  Intake/Output Summary (Last 24 hours) at 02/03/2019 2049 Last data filed at 02/03/2019 1409 Gross per 24 hour  Intake -  Output 150 ml  Net -150 ml    Labs/Imaging Results for orders placed or performed during the hospital encounter of 02/03/19 (from the past  48 hour(s))  CBC with Differential     Status: None   Collection Time: 02/03/19 12:31 PM  Result Value Ref Range   WBC 7.3 4.0 - 10.5 K/uL   RBC 4.33 4.22 - 5.81 MIL/uL   Hemoglobin 13.2 13.0 - 17.0 g/dL   HCT 41.3 39.0 - 52.0 %   MCV 95.4 80.0 - 100.0 fL   MCH 30.5 26.0 - 34.0 pg   MCHC 32.0 30.0 - 36.0 g/dL   RDW 13.6 11.5 - 15.5 %   Platelets 164 150 - 400 K/uL   nRBC 0.0 0.0 - 0.2 %   Neutrophils Relative % 70 %   Neutro Abs 5.1 1.7 - 7.7 K/uL   Lymphocytes Relative 17 %   Lymphs Abs 1.3 0.7 - 4.0 K/uL   Monocytes Relative 9 %   Monocytes Absolute 0.6 0.1 - 1.0 K/uL   Eosinophils Relative 3 %   Eosinophils Absolute 0.2 0.0 - 0.5 K/uL   Basophils Relative 1 %   Basophils Absolute 0.0 0.0 - 0.1 K/uL   Immature Granulocytes 0 %   Abs Immature Granulocytes 0.03 0.00 - 0.07 K/uL    Comment: Performed at [facility], [address] [place], [place] [address]  Comprehensive metabolic panel     Status: Abnormal   Collection Time: 02/03/19 12:31 PM  Result Value Ref Range   Sodium 137 135 - 145 mmol/L   Potassium 3.9 3.5 - 5.1 mmol/L  Chloride 103 98 - 111 mmol/L   CO2 27 22 - 32 mmol/L   Glucose, Bld 96 70 - 99 mg/dL   BUN 29 (H) 8 - 23 mg/dL   Creatinine, Ser 0.85 0.61 - 1.24 mg/dL   Calcium 9.0 8.9 - 10.3 mg/dL   Total Protein 6.6 6.5 - 8.1 g/dL   Albumin 3.8 3.5 - 5.0 g/dL   AST 17 15 - 41 U/L   ALT 5 0 - 44 U/L   Alkaline Phosphatase 48 38 - 126 U/L   Total Bilirubin 1.2 0.3 - 1.2 mg/dL   GFR calc non Af Amer >60 >60 mL/min   GFR calc Af Amer >60 >60 mL/min   Anion gap 7 5 - 15    Comment: Performed at [facility], [address] [place], [place] [address]  Urinalysis, Complete w Microscopic     Status: Abnormal   Collection Time: 02/03/19 12:31 PM  Result Value Ref Range   Color, Urine YELLOW (A) YELLOW   APPearance CLEAR (A) CLEAR   Specific Gravity, Urine 1.013 1.005 - 1.030   pH 6.0 5.0 - 8.0   Glucose, UA NEGATIVE NEGATIVE mg/dL    Hgb urine dipstick NEGATIVE NEGATIVE   Bilirubin Urine NEGATIVE NEGATIVE   Ketones, ur NEGATIVE NEGATIVE mg/dL   Protein, ur NEGATIVE NEGATIVE mg/dL   Nitrite NEGATIVE NEGATIVE   Leukocytes,Ua NEGATIVE NEGATIVE   WBC, UA 0-5 0 - 5 WBC/hpf   Bacteria, UA NONE SEEN NONE SEEN   Squamous Epithelial / LPF NONE SEEN 0 - 5   Mucus PRESENT     Comment: Performed at [facility], [place], [place] [address]  Troponin I (High Sensitivity)     Status: None   Collection Time: 02/03/19 12:31 PM  Result Value Ref Range   Troponin I (High Sensitivity) 11 <18 ng/L    Comment: (NOTE) Elevated high sensitivity troponin I (hsTnI) values and significant  changes across serial measurements may suggest ACS but many other  chronic and acute conditions are known to elevate hsTnI results.  Refer to the "Links" section for chest pain algorithms and additional  guidance. Performed at [facility], [place], [place] [address]   Glucose, capillary     Status: Abnormal   Collection Time: 02/03/19 12:43 PM  Result Value Ref Range   Glucose-Capillary 101 (H) 70 - 99 mg/dL   Comment 1 Notify RN    Comment 2 Document in Chart    Ct Head Wo Contrast  Result Date: 02/03/2019 CLINICAL DATA:  85 year old male with acute slurred speech today. EXAM: CT HEAD WITHOUT CONTRAST TECHNIQUE: Contiguous axial images were obtained from the base of the skull through the vertex without intravenous contrast. COMPARISON:  02/05/2018 CT FINDINGS: Brain: No evidence of acute infarction, hemorrhage, hydrocephalus, extra-axial collection or mass lesion/mass effect. Atrophy and moderate to severe chronic small-vessel white matter ischemic changes again noted. Vascular: Carotid and vertebral atherosclerotic calcifications again noted. Skull: Normal. Negative for fracture or focal lesion. Sinuses/Orbits: No acute finding. Other: None. IMPRESSION: 1. No evidence of acute intracranial  abnormality. 2. Atrophy and chronic small-vessel white matter ischemic changes. Electronically Signed   By: Margarette Canada M.D.   On: 02/03/2019 13:23    Pending Labs Unresulted Labs (From admission, onward)    Start     Ordered   02/04/19 0500  Hemoglobin A1c  Tomorrow morning,   STAT     02/03/19 1728   02/04/19 0500  Lipid panel  Tomorrow morning,   STAT    Comments: Fasting    02/03/19 1728   02/04/19 0500  CBC  Tomorrow morning,   STAT     02/03/19 1728   02/04/19 3151  Basic metabolic panel  Tomorrow morning,   STAT     02/03/19 1728   02/03/19 1507  SARS CORONAVIRUS 2 Nasal Swab Aptima Multi Swab  (Asymptomatic/Tier 2)  Once,   STAT    Question Answer Comment  Is this test for diagnosis or screening Screening   Symptomatic for COVID-19 as defined by CDC No   Hospitalized for COVID-19 No   Admitted to ICU for COVID-19 No   Previously tested for COVID-19 No   Resident in a congregate (group) care setting Yes   Employed in healthcare setting No      02/03/19 1506          Vitals/Pain Today's Vitals   02/03/19 1527 02/03/19 1600 02/03/19 1927 02/03/19 1927  BP:  (!) 191/76 (!) 200/97 (!) 200/97  Pulse:  62  76  Resp:  19  18  Temp: 98.6 F (37 C)     TempSrc: Oral     SpO2:  97%  99%  Weight:      Height:      PainSc:        Isolation Precautions No active isolations  Medications Medications  furosemide (LASIX) tablet 20 mg (20 mg Oral Not Given 02/03/19 1803)  busPIRone (BUSPAR) tablet 7.5 mg (7.5 mg Oral Given 02/03/19 2025)  finasteride (PROSCAR) tablet 5 mg (5 mg Oral Refused 02/03/19 1802)  carbidopa-levodopa (SINEMET IR) 25-250 MG per tablet immediate release 1 tablet (1 tablet Oral Given 02/03/19 1846)  dorzolamide-timolol (COSOPT) 22.3-6.8 MG/ML ophthalmic solution 1 drop (has no administration in time range)  latanoprost (XALATAN) 0.005 % ophthalmic solution 1 drop (has no administration in time range)   stroke: mapping our early stages of recovery book  (has no administration in time range)  acetaminophen (TYLENOL) tablet 650 mg (has no administration in time range)    Or  acetaminophen (TYLENOL) solution 650 mg (has no administration in time range)    Or  acetaminophen (TYLENOL) suppository 650 mg (has no administration in time range)  enoxaparin (LOVENOX) injection 40 mg (has no administration in time range)  aspirin EC tablet 81 mg (has no administration in time range)  atorvastatin (LIPITOR) tablet 40 mg (40 mg Oral Not Given 02/03/19 1803)  hydrALAZINE (APRESOLINE) injection 5 mg (5 mg Intravenous Not Given 02/03/19 1928)  aspirin chewable tablet 324 mg (324 mg Oral Given 02/03/19 1437)    Mobility walks with device Low fall risk   Focused Assessments N/A   R Recommendations: See Admitting Provider Note  Report given to:   Additional Notes: N/A

## 2019-02-03 NOTE — ED Notes (Signed)
Called Pharmacy and spoke with Corene Cornea to inquire about pt's medication. Per Corene Cornea they will send down shortly

## 2019-02-03 NOTE — ED Provider Notes (Signed)
[facility] Emergency Department Provider Note       Time seen: ----------------------------------------- 12:40 PM on 02/03/2019 -----------------------------------------   I have reviewed the triage vital signs and the nursing notes.  HISTORY   Chief Complaint Polyuria    HPI Gavin Garcia is a 85 y.o. male with a history of basal cell carcinoma, Parkinson's disease, enlarged prostate who presents to the ED for polyuria since last night.  Staff reports this morning he had speech difficulties where he could not find the proper words to say.  This was described by EMS as expressive aphasia.  Last known well was 7 AM this morning.  Symptoms seem to have resolved, he denies any speech difficulty at this time.  Past Medical History:  Diagnosis Date  . Basal cell carcinoma   . Glaucoma (increased eye pressure)   . Osteoporosis   . Parkinson's disease ([place])   . Prostate enlargement     Patient Active Problem List   Diagnosis Date Noted  . Sacral fracture ([place]) 04/16/2018  . Parkinson disease ([place]) 04/16/2018  . Lower extremity weakness 03/08/2018    History reviewed. No pertinent surgical history.  Allergies Codeine  Social History Social History   Tobacco Use  . Smoking status: Never Smoker  . Smokeless tobacco: Never Used  Substance Use Topics  . Alcohol use: No  . Drug use: Not on file   Review of Systems Constitutional: Negative for fever. Cardiovascular: Negative for chest pain. Respiratory: Negative for shortness of breath. Gastrointestinal: Negative for abdominal pain, vomiting and diarrhea. Genitourinary: Positive for polyuria Musculoskeletal: Negative for back pain. Skin: Negative for rash. Neurological: Negative for headaches, focal weakness or numbness.  Positive for speech disturbance  All systems negative/normal/unremarkable except as stated in the HPI  ____________________________________________   PHYSICAL  EXAM:  VITAL SIGNS: ED Triage Vitals  Enc Vitals Group     BP 02/03/19 1217 (!) 186/63     Pulse Rate 02/03/19 1213 60     Resp 02/03/19 1213 11     Temp --      Temp Source 02/03/19 1213 Oral     SpO2 02/03/19 1213 97 %     Weight 02/03/19 1214 162 lb (73.5 kg)     Height 02/03/19 1214 6' (1.829 m)     Head Circumference --      Peak Flow --      Pain Score 02/03/19 1214 0     Pain Loc --      Pain Edu? --      Excl. in [place]? --    Constitutional: Alert and oriented. Well appearing and in no distress. Eyes: Conjunctivae are normal. Normal extraocular movements. Cardiovascular: Normal rate, regular rhythm. No murmurs, rubs, or gallops. Respiratory: Normal respiratory effort without tachypnea nor retractions. Breath sounds are clear and equal bilaterally. No wheezes/rales/rhonchi. Gastrointestinal: Soft and nontender. Normal bowel sounds Musculoskeletal: Nontender with normal range of motion in extremities. No lower extremity tenderness nor edema. Neurologic:  Normal speech and language. No gross focal neurologic deficits are appreciated.  Skin:  Skin is warm, dry and intact. No rash noted. Psychiatric: Mood and affect are normal. Speech and behavior are normal.  ____________________________________________  EKG: Interpreted by me.  Sinus rhythm with first-degree AV block, rate is 57 bpm, wide QRS, left axis deviation, normal QT  ____________________________________________  ED COURSE:  As part of my medical decision making, I reviewed the following data within the [place] History obtained from family  if available, nursing notes, old chart and ekg, as well as notes from prior ED visits. Patient presented for polyuria and expressive aphasia, we will assess with labs and imaging as indicated at this time.   Procedures  TAJUAN DUFAULT was evaluated in Emergency Department on 02/03/2019 for the symptoms described in the history of present illness. He was  evaluated in the context of the global COVID-19 pandemic, which necessitated consideration that the patient might be at risk for infection with the SARS-CoV-2 virus that causes COVID-19. Institutional protocols and algorithms that pertain to the evaluation of patients at risk for COVID-19 are in a state of rapid change based on information released by regulatory bodies including the CDC and federal and state organizations. These policies and algorithms were followed during the patient's care in the ED.  ____________________________________________   LABS (pertinent positives/negatives)  Labs Reviewed  COMPREHENSIVE METABOLIC PANEL - Abnormal; Notable for the following components:      Result Value   BUN 29 (*)    All other components within normal limits  URINALYSIS, COMPLETE (UACMP) WITH MICROSCOPIC - Abnormal; Notable for the following components:   Color, Urine YELLOW (*)    APPearance CLEAR (*)    All other components within normal limits  GLUCOSE, CAPILLARY - Abnormal; Notable for the following components:   Glucose-Capillary 101 (*)    All other components within normal limits  CBC WITH DIFFERENTIAL/PLATELET  CBG MONITORING, ED  TROPONIN I (HIGH SENSITIVITY)    RADIOLOGY Images were viewed by me  CT head  IMPRESSION:  1. No evidence of acute intracranial abnormality.  2. Atrophy and chronic small-vessel white matter ischemic changes.  ____________________________________________   DIFFERENTIAL DIAGNOSIS   CVA, TIA, medication side effect, hypoglycemia  FINAL ASSESSMENT AND PLAN  Aphasia, TIA   Plan: The patient had presented for aphasia. Patient's labs are reassuring. Patient's imaging is unremarkable.  Given the fact that his symptoms of aphasia have resolved, likely TIA.  He was given full dose aspirin will be admitted for a full TIA/CVA work-up   Laurence Aly, MD    Note: This note was generated in part or whole with voice recognition software. Voice  recognition is usually quite accurate but there are transcription errors that can and very often do occur. I apologize for any typographical errors that were not detected and corrected.     Earleen Newport, MD 02/03/19 [id number]

## 2019-02-03 NOTE — Consult Note (Signed)
Reason for [place] Referring Physician: Dr. Brett Albino  CC: aphasia  HPI: Gavin Garcia is an 85 y.o. male with a known history of Parkinson's disease, osteoporosis, basal cell carcinoma who presented to the ED with expressive aphasia that started this morning and has resolved.  Patient states he "knew what he wanted to say, but was unable to say it".  His symptoms lasted for about 1 hour.  Pt was on ASA prior but was taken off by his PCP  Past Medical History:  Diagnosis Date  . Basal cell carcinoma   . Glaucoma (increased eye pressure)   . Osteoporosis   . Parkinson's disease ([place])   . Prostate enlargement     History reviewed. No pertinent surgical history.  History reviewed. No pertinent family history.  Social History:  reports that he has never smoked. He has never used smokeless tobacco. He reports that he does not drink alcohol. No history on file for drug.  Allergies  Allergen Reactions  . Codeine Nausea And Vomiting    Medications: I have reviewed the patient's current medications.  ROS: History obtained from the patient  General ROS: negative for - chills, fatigue, fever, night sweats, weight gain or weight loss Psychological ROS: negative for - behavioral disorder, hallucinations, memory difficulties, mood swings or suicidal ideation Ophthalmic ROS: negative for - blurry vision, double vision, eye pain or loss of vision ENT ROS: negative for - epistaxis, nasal discharge, oral lesions, sore throat, tinnitus or vertigo Allergy and Immunology ROS: negative for - hives or itchy/watery eyes Hematological and Lymphatic ROS: negative for - bleeding problems, bruising or swollen lymph nodes Endocrine ROS: negative for - galactorrhea, hair pattern changes, polydipsia/polyuria or temperature intolerance Respiratory ROS: negative for - cough, hemoptysis, shortness of breath or wheezing Cardiovascular ROS: negative for - chest pain, dyspnea on exertion, edema or irregular  heartbeat Gastrointestinal ROS: negative for - abdominal pain, diarrhea, hematemesis, nausea/vomiting or stool incontinence Genito-Urinary ROS: negative for - dysuria, hematuria, incontinence or urinary frequency/urgency Musculoskeletal ROS: negative for - joint swelling or muscular weakness Neurological ROS: as noted in HPI Dermatological ROS: negative for rash and skin lesion changes  Physical Examination: Blood pressure (!) 200/97, pulse 76, temperature 98.6 F (37 C), temperature source Oral, resp. rate 18, height 6' (1.829 m), weight 73.5 kg, SpO2 99 %.    Neurological Examination   Mental Status: Alert some periods of confusion  Cranial Nerves: II: Discs flat bilaterally; Visual fields grossly normal, pupils equal, round, reactive to light and accommodation III,IV, VI: ptosis not present, extra-ocular motions intact bilaterally V,VII: smile symmetric, facial light touch sensation normal bilaterally VIII: hearing normal bilaterally IX,X: gag reflex present XI: bilateral shoulder shrug XII: midline tongue extension Motor: Generalized weakness  Tone and bulk:normal tone throughout; no atrophy noted Sensory: Pinprick and light touch intact throughout, bilaterally Deep Tendon Reflexes: 1+ and symmetric throughout Plantars: Right: downgoing   Left: downgoing Cerebellar: normal finger-to-nose Gait: not tested      Laboratory Studies:   Basic Metabolic Panel: Recent Labs  Lab 02/03/19 1231  NA 137  K 3.9  CL 103  CO2 27  GLUCOSE 96  BUN 29*  CREATININE 0.85  CALCIUM 9.0    Liver Function Tests: Recent Labs  Lab 02/03/19 1231  AST 17  ALT 5  ALKPHOS 48  BILITOT 1.2  PROT 6.6  ALBUMIN 3.8   No results for input(s): LIPASE, AMYLASE in the last 168 hours. No results for input(s): AMMONIA in the last 168 hours.  CBC: Recent Labs  Lab 02/03/19 1231  WBC 7.3  NEUTROABS 5.1  HGB 13.2  HCT 41.3  MCV 95.4  PLT 164    Cardiac Enzymes: No results for  input(s): CKTOTAL, CKMB, CKMBINDEX, TROPONINI in the last 168 hours.  BNP: Invalid input(s): POCBNP  CBG: Recent Labs  Lab 02/03/19 1243  GLUCAP 101*    Microbiology: Results for orders placed or performed during the hospital encounter of 03/07/18  Urine culture     Status: None   Collection Time: 03/07/18 11:49 PM   Specimen: Urine, Random  Result Value Ref Range Status   Specimen Description   Final    URINE, RANDOM Performed at [facility], [address], [place], [place] [address]    Special Requests   Final    NONE Performed at [facility], [address], [place], [place] [address]    Culture   Final    NO GROWTH Performed at [facility], [place] [address], [place], [place] [address]    Report Status 03/09/2018 FINAL  Final    Coagulation Studies: No results for input(s): LABPROT, INR in the last 72 hours.  Urinalysis:  Recent Labs  Lab 02/03/19 1231  COLORURINE YELLOW*  LABSPEC 1.013  PHURINE 6.0  GLUCOSEU NEGATIVE  HGBUR NEGATIVE  BILIRUBINUR NEGATIVE  KETONESUR NEGATIVE  PROTEINUR NEGATIVE  NITRITE NEGATIVE  LEUKOCYTESUR NEGATIVE    Lipid Panel:  No results found for: CHOL, TRIG, HDL, CHOLHDL, VLDL, LDLCALC  HgbA1C: No results found for: HGBA1C  Urine Drug Screen:  No results found for: LABOPIA, COCAINSCRNUR, LABBENZ, AMPHETMU, THCU, LABBARB  Alcohol Level: No results for input(s): ETH in the last 168 hours.  Other results: EKG: normal EKG, normal sinus rhythm, unchanged from previous tracings.  Imaging: Ct Head Wo Contrast  Result Date: 02/03/2019 CLINICAL DATA:  85 year old male with acute slurred speech today. EXAM: CT HEAD WITHOUT CONTRAST TECHNIQUE: Contiguous axial images were obtained from the base of the skull through the vertex without intravenous contrast. COMPARISON:  02/05/2018 CT FINDINGS: Brain: No evidence of acute infarction, hemorrhage, hydrocephalus, extra-axial collection or mass lesion/mass  effect. Atrophy and moderate to severe chronic small-vessel white matter ischemic changes again noted. Vascular: Carotid and vertebral atherosclerotic calcifications again noted. Skull: Normal. Negative for fracture or focal lesion. Sinuses/Orbits: No acute finding. Other: None. IMPRESSION: 1. No evidence of acute intracranial abnormality. 2. Atrophy and chronic small-vessel white matter ischemic changes. Electronically Signed   By: Margarette Canada M.D.   On: 02/03/2019 13:23     Assessment/Plan:  85 y.o. male with a known history of Parkinson's disease, osteoporosis, basal cell carcinoma who presented to the ED with expressive aphasia that started this morning and has resolved.  Patient states he "knew what he wanted to say, but was unable to say it".  His symptoms lasted for about 1 hour.  Pt was on ASA prior but was taken off by his PCP  - CTH no acute abnormalities - ASA restarted - TIA work up pending with imaging such as MRI - d/c planning in AM on ASA and statin  Gavin Garcia  02/03/2019, 8:47 PM

## 2019-02-03 NOTE — ED Notes (Signed)
Patient transported to CT 

## 2019-02-03 NOTE — ED Triage Notes (Signed)
Patient arrived by EMS from [facility]. Patient c/o polyuria last night. Staff reports this AM speech difficulties after waking up from nap. LKN 0700 per staff. Hx parkinsons

## 2019-02-03 NOTE — H&P (Signed)
[place] [facility] [place] NAME: Gavin Garcia    MR#:  [id number]  DATE OF BIRTH:  02/08/1928  DATE OF ADMISSION:  02/03/2019  PRIMARY CARE PHYSICIAN: Derinda Late, MD   REQUESTING/REFERRING PHYSICIAN: Lenise Arena, MD  CHIEF COMPLAINT:   Chief Complaint  Patient presents with  . Polyuria    HISTORY OF PRESENT ILLNESS:  Gavin Garcia  is a 85 y.o. male with a known history of Parkinson's disease, osteoporosis, basal cell carcinoma who presented to the ED with expressive aphasia that started this morning.  Patient states he "knew what he wanted to say, but was unable to say it".  His symptoms lasted for about 1 hour.  He denies any weakness, numbness, tingling of the extremities.  He denies any vision changes or slurred speech.  Of note, he was seen by his PCP 4 weeks ago and his baby aspirin was stopped because it was decided that risks outweigh benefits.  At baseline, he lives at an independent living facility, but receives 24-hour care.  He uses a walker and wheelchair to get around.  In the ED, he was hypertensive with BP 186/63.  Labs were unremarkable.  CT head was unremarkable.  He was given aspirin.  Hospitalists were called for admission.  PAST MEDICAL HISTORY:   Past Medical History:  Diagnosis Date  . Basal cell carcinoma   . Glaucoma (increased eye pressure)   . Osteoporosis   . Parkinson's disease ([place])   . Prostate enlargement     PAST SURGICAL HISTORY:  History reviewed. No pertinent surgical history.  SOCIAL HISTORY:   Social History   Tobacco Use  . Smoking status: Never Smoker  . Smokeless tobacco: Never Used  Substance Use Topics  . Alcohol use: No    FAMILY HISTORY:  Father-heart disease  DRUG ALLERGIES:   Allergies  Allergen Reactions  . Codeine Nausea And Vomiting    REVIEW OF SYSTEMS:   Review of Systems  Constitutional: Negative for chills and fever.  HENT: Negative for congestion and  sore throat.   Eyes: Negative for blurred vision and double vision.  Respiratory: Negative for cough and shortness of breath.   Cardiovascular: Negative for chest pain and palpitations.  Gastrointestinal: Negative for nausea and vomiting.  Genitourinary: Negative for dysuria and urgency.  Musculoskeletal: Negative for back pain and neck pain.  Neurological: Positive for speech change. Negative for dizziness, sensory change, focal weakness and headaches.  Psychiatric/Behavioral: Negative for depression. The patient is not nervous/anxious.     MEDICATIONS AT HOME:   Prior to Admission medications   Medication Sig Start Date End Date Taking? Authorizing Provider  acetaminophen (TYLENOL) 325 MG tablet Take 2 tablets (650 mg total) by mouth every 6 (six) hours as needed for mild pain (or Fever >/= 101). 03/11/18  Yes Sainani, Belia Heman, MD  busPIRone (BUSPAR) 7.5 MG tablet Take 7.5 mg by mouth 2 (two) times daily. 02/01/19 02/02/20 Yes [provider]  carbidopa-levodopa (SINEMET IR) 25-250 MG tablet Take 1 tablet by mouth 3 (three) times daily. 04/16/18  Yes Gerlene Fee, NP  dorzolamide-timolol (COSOPT) 22.3-6.8 MG/ML ophthalmic solution Place 1 drop into the left eye 2 (two) times daily. 04/16/18  Yes Gerlene Fee, NP  finasteride (PROSCAR) 5 MG tablet Take 1 tablet (5 mg total) by mouth daily. 04/16/18  Yes Gerlene Fee, NP  furosemide (LASIX) 20 MG tablet Take 1 tablet (20 mg total) by mouth daily as needed  for edema. Patient taking differently: Take 20 mg by mouth daily.  04/16/18  Yes Gerlene Fee, NP  Infant Care Products (DERMACLOUD) CREA Apply 1 application topically daily as needed (skin irritation/breakdown).    Yes [provider]  latanoprost (XALATAN) 0.005 % ophthalmic solution Place 1 drop into both eyes at bedtime. 04/16/18  Yes Gerlene Fee, NP  mupirocin ointment (BACTROBAN) 2 % Place 1 application into the nose 2 (two) times daily as needed (leg  wounds).   Yes [provider]  polyethylene glycol powder (GLYCOLAX/MIRALAX) powder Take 17 g by mouth daily.   Yes [provider]      VITAL SIGNS:  Blood pressure (!) 186/63, pulse 60, resp. rate 11, height 6' (1.829 m), weight 73.5 kg, SpO2 97 %.  PHYSICAL EXAMINATION:  Physical Exam  GENERAL:  85 y.o.-year-old patient lying in the bed with no acute distress.  EYES: Pupils equal, round, reactive to light and accommodation. No scleral icterus. Extraocular muscles intact.  HEENT: Head atraumatic, normocephalic. Oropharynx and nasopharynx clear.  NECK:  Supple, no jugular venous distention. No thyroid enlargement, no tenderness.  LUNGS: Normal breath sounds bilaterally, no wheezing, rales,rhonchi or crepitation. No use of accessory muscles of respiration.  CARDIOVASCULAR: RRR, S1, S2 normal. No murmurs, rubs, or gallops.  ABDOMEN: Soft, nontender, nondistended. Bowel sounds present. No organomegaly or mass.  EXTREMITIES: No pedal edema, cyanosis, or clubbing.  NEUROLOGIC: Cranial nerves II through XII are intact. Muscle strength 5/5 in all extremities. Sensation intact.  Normal finger-to-nose testing bilaterally. Gait not checked.  PSYCHIATRIC: The patient is alert and oriented x 3.  SKIN: No obvious rash, lesion, or ulcer.   LABORATORY PANEL:   CBC Recent Labs  Lab 02/03/19 1231  WBC 7.3  HGB 13.2  HCT 41.3  PLT 164   ------------------------------------------------------------------------------------------------------------------  Chemistries  Recent Labs  Lab 02/03/19 1231  NA 137  K 3.9  CL 103  CO2 27  GLUCOSE 96  BUN 29*  CREATININE 0.85  CALCIUM 9.0  AST 17  ALT 5  ALKPHOS 48  BILITOT 1.2   ------------------------------------------------------------------------------------------------------------------  Cardiac Enzymes No results for input(s): TROPONINI in the last 168 hours.  ------------------------------------------------------------------------------------------------------------------  RADIOLOGY:  Ct Head Wo Contrast  Result Date: 02/03/2019 CLINICAL DATA:  85 year old male with acute slurred speech today. EXAM: CT HEAD WITHOUT CONTRAST TECHNIQUE: Contiguous axial images were obtained from the base of the skull through the vertex without intravenous contrast. COMPARISON:  02/05/2018 CT FINDINGS: Brain: No evidence of acute infarction, hemorrhage, hydrocephalus, extra-axial collection or mass lesion/mass effect. Atrophy and moderate to severe chronic small-vessel white matter ischemic changes again noted. Vascular: Carotid and vertebral atherosclerotic calcifications again noted. Skull: Normal. Negative for fracture or focal lesion. Sinuses/Orbits: No acute finding. Other: None. IMPRESSION: 1. No evidence of acute intracranial abnormality. 2. Atrophy and chronic small-vessel white matter ischemic changes. Electronically Signed   By: Margarette Canada M.D.   On: 02/03/2019 13:23      IMPRESSION AND PLAN:   Expressive aphasia- likely due to TIA, as his symptoms resolved after 1 hour.  Patient stopped baby aspirin 4 weeks ago. -CT head negative -Will obtain MRI brain -ECHO and carotid Dopplers -Neurology consult -Lipid panel and A1c -Start aspirin 81 mg daily and Lipitor 40 mg daily  Hypertension-BP elevated in the ED -Will allow for permissive hypertension -Hydralazine IV as needed  Depression-stable -Continue home BuSpar  BPH-stable -Continue home finasteride  Parkinson's disease-stable -Continue home Sinemet -Supportive care  All the records  are reviewed and case discussed with ED provider. Management plans discussed with the patient, family and they are in agreement.  CODE STATUS: DNR  TOTAL TIME TAKING CARE OF THIS PATIENT: 45 minutes.    Berna Spare Kincade Granberg M.D on 02/03/2019 at 2:54 PM  Between 7am to 6pm - Pager [phone]  After 6pm go to  [email or web address] - Proofreader  [facility]  Office  [phone]  CC: Primary care physician; Derinda Late, MD   Note: This dictation was prepared with Dragon dictation along with smaller phrase technology. Any transcriptional errors that result from this process are unintentional.

## 2019-02-03 NOTE — Progress Notes (Signed)
Family Meeting Note  Advance Directive:yes  Today a meeting took place with the Patient and daughter  Patient is able to participate.  The following clinical team members were present during this meeting:MD  The following were discussed:Patient's diagnosis: TIA, Patient's progosis: Unable to determine and Goals for treatment: DNR  Additional follow-up to be provided: prn  Time spent during discussion:20 minutes  Evette Doffing, MD

## 2019-02-03 NOTE — ED Notes (Signed)
Pt given meal tray and drink at this time 

## 2019-02-04 ENCOUNTER — Observation Stay: Admit: 2019-02-04 | Payer: Medicare HMO

## 2019-02-04 ENCOUNTER — Observation Stay
Admit: 2019-02-04 | Discharge: 2019-02-04 | Disposition: A | Discharge status: Home / Self Care | Payer: Medicare HMO | Attending: Internal Medicine | Admitting: Internal Medicine

## 2019-02-04 ENCOUNTER — Observation Stay: Payer: Medicare HMO

## 2019-02-04 LAB — CBC
HCT: 38.9 % — ABNORMAL LOW (ref 39.0–52.0)
Hemoglobin: 13.1 g/dL (ref 13.0–17.0)
MCH: 31 pg (ref 26.0–34.0)
MCHC: 33.7 g/dL (ref 30.0–36.0)
MCV: 92 fL (ref 80.0–100.0)
Platelets: 147 10*3/uL — ABNORMAL LOW (ref 150–400)
RBC: 4.23 MIL/uL (ref 4.22–5.81)
RDW: 13.4 % (ref 11.5–15.5)
WBC: 6.9 10*3/uL (ref 4.0–10.5)
nRBC: 0 % (ref 0.0–0.2)

## 2019-02-04 LAB — BASIC METABOLIC PANEL
Anion gap: 5 (ref 5–15)
BUN: 27 mg/dL — ABNORMAL HIGH (ref 8–23)
CO2: 26 mmol/L (ref 22–32)
Calcium: 8.8 mg/dL — ABNORMAL LOW (ref 8.9–10.3)
Chloride: 107 mmol/L (ref 98–111)
Creatinine, Ser: 0.72 mg/dL (ref 0.61–1.24)
GFR calc Af Amer: >60 mL/min (ref >60)
GFR calc non Af Amer: >60 mL/min (ref >60)
Glucose, Bld: 87 mg/dL (ref 70–99)
Potassium: 3.5 mmol/L (ref 3.5–5.1)
Sodium: 138 mmol/L (ref 135–145)

## 2019-02-04 LAB — LIPID PANEL
Cholesterol: 173 mg/dL (ref 0–200)
HDL: 57 mg/dL (ref >40)
LDL Cholesterol: 106 mg/dL — ABNORMAL HIGH (ref 0–99)
Total CHOL/HDL Ratio: 3 RATIO
Triglycerides: 52 mg/dL (ref <150)
VLDL: 10 mg/dL (ref 0–40)

## 2019-02-04 LAB — HEMOGLOBIN A1C
Hgb A1c MFr Bld: 5.4 % (ref 4.8–5.6)
Mean Plasma Glucose: 108.28 mg/dL

## 2019-02-04 LAB — SARS CORONAVIRUS 2 (TAT 6-24 HRS): SARS Coronavirus 2: NEGATIVE

## 2019-02-04 MED ORDER — ASPIRIN 81 MG PO TBEC: 81.0000 mg | DELAYED_RELEASE_TABLET | Freq: Every day | ORAL | 2 refills | Status: DC

## 2019-02-04 MED ORDER — AMLODIPINE BESYLATE 10 MG PO TABS
10.0000 mg | ORAL_TABLET | Freq: Every day | ORAL | Status: DC
  Administered 2019-02-04: 11:00:00 10 mg via ORAL
  Filled 2019-02-04: qty 1

## 2019-02-04 MED ORDER — AMLODIPINE BESYLATE 10 MG PO TABS: 10.0000 mg | ORAL_TABLET | Freq: Every day | ORAL | 1 refills | Status: DC

## 2019-02-04 MED ORDER — ATORVASTATIN CALCIUM 40 MG PO TABS: 40.0000 mg | ORAL_TABLET | Freq: Every day | ORAL | 1 refills | Status: DC

## 2019-02-04 MED ORDER — HYDRALAZINE HCL 20 MG/ML IJ SOLN
5.0000 mg | INTRAMUSCULAR | Status: DC | PRN
  Administered 2019-02-04: 5 mg via INTRAVENOUS

## 2019-02-04 MED ORDER — AMLODIPINE BESYLATE 5 MG PO TABS: 5.0000 mg | ORAL_TABLET | Freq: Every day | ORAL | Status: DC

## 2019-02-04 NOTE — Plan of Care (Signed)

## 2019-02-04 NOTE — Progress Notes (Signed)
*  PRELIMINARY RESULTS* Echocardiogram 2D Echocardiogram has been performed.  Sherrie Sport 02/04/2019, 12:32 PM

## 2019-02-04 NOTE — Progress Notes (Signed)
PT FOR  DISCHARGE BACK TO [facility].  INSTRUCTIONS TO PTS DTR LAURA. MEDS ,DIET, ACTIVITY AND F/U DISCUSSED. VERBALIZE UNDERSTANDING.  SL D/CD.  CONDON CATH REMOVED.  NO VOICED C/O. WAITING FOR VAN TO PICK PT UP.

## 2019-02-04 NOTE — Discharge Summary (Addendum)
[facility] NAME: Gavin Garcia    MR#:  [id number]  DATE OF BIRTH:  1927-06-30  DATE OF ADMISSION:  02/03/2019   ADMITTING PHYSICIAN: Sela Hua, MD  DATE OF DISCHARGE: 02/04/2019  2:34 PM  PRIMARY CARE PHYSICIAN: Derinda Late, MD   ADMISSION DIAGNOSIS:  TIA (transient ischemic attack) [G45.9] Stroke ([place]) [I63.9] DISCHARGE DIAGNOSIS:  Active Problems:   Expressive aphasia  SECONDARY DIAGNOSIS:   Past Medical History:  Diagnosis Date  . Basal cell carcinoma   . Glaucoma (increased eye pressure)   . Osteoporosis   . Parkinson's disease ([place])   . Prostate enlargement    HOSPITAL COURSE:  Expressive aphasia- likely due to TIA, as his symptoms resolved after 1 hour.  Patient stopped baby aspirin 4 weeks ago. -CT head negative MRI brain is unremarkable. -ECHO is pending and carotid Dopplers is unremarkable. Continue aspirin 81 mg daily and Lipitor 40 mg daily  Hypertension, accelerated. Continue lasix, added norvasc. -Hydralazine IV as needed  Depression-stable -Continue home BuSpar  BPH-stable -Continue home finasteride  Parkinson's disease-stable -Continue home Sinemet DISCHARGE CONDITIONS:  Stable, discharge to home and resume HHPT today. CONSULTS OBTAINED:  Treatment Team:  Leotis Pain, MD DRUG ALLERGIES:   Allergies  Allergen Reactions  . Codeine Nausea And Vomiting   DISCHARGE MEDICATIONS:   Allergies as of 02/04/2019      Reactions   Codeine Nausea And Vomiting      Medication List    TAKE these medications   acetaminophen 325 MG tablet Commonly known as: TYLENOL Take 2 tablets (650 mg total) by mouth every 6 (six) hours as needed for mild pain (or Fever >/= 101).   amLODipine 10 MG tablet Commonly known as: NORVASC Take 1 tablet (10 mg total) by mouth daily.   aspirin 81 MG EC tablet Take 1 tablet (81 mg total) by mouth daily.   atorvastatin 40 MG tablet Commonly  known as: LIPITOR Take 1 tablet (40 mg total) by mouth daily at 6 PM.   busPIRone 7.5 MG tablet Commonly known as: BUSPAR Take 7.5 mg by mouth 2 (two) times daily.   carbidopa-levodopa 25-250 MG tablet Commonly known as: SINEMET IR Take 1 tablet by mouth 3 (three) times daily.   Dermacloud Crea Apply 1 application topically daily as needed (skin irritation/breakdown).   dorzolamide-timolol 22.3-6.8 MG/ML ophthalmic solution Commonly known as: COSOPT Place 1 drop into the left eye 2 (two) times daily.   finasteride 5 MG tablet Commonly known as: PROSCAR Take 1 tablet (5 mg total) by mouth daily.   furosemide 20 MG tablet Commonly known as: LASIX Take 1 tablet (20 mg total) by mouth daily as needed for edema. What changed: when to take this   latanoprost 0.005 % ophthalmic solution Commonly known as: XALATAN Place 1 drop into both eyes at bedtime.   mupirocin ointment 2 % Commonly known as: BACTROBAN Place 1 application into the nose 2 (two) times daily as needed (leg wounds).   polyethylene glycol powder 17 GM/SCOOP powder Commonly known as: GLYCOLAX/MIRALAX Take 17 g by mouth daily.        DISCHARGE INSTRUCTIONS:  See AVS. If you experience worsening of your admission symptoms, develop shortness of breath, life threatening emergency, suicidal or homicidal thoughts you must seek medical attention immediately by calling 911 or calling your MD immediately  if symptoms less severe.  You Must read complete instructions/literature along with all the possible adverse reactions/side effects  for all the Medicines you take and that have been prescribed to you. Take any new Medicines after you have completely understood and accpet all the possible adverse reactions/side effects.   Please note  You were cared for by a hospitalist during your hospital stay. If you have any questions about your discharge medications or the care you received while you were in the hospital after  you are discharged, you can call the unit and asked to speak with the hospitalist on call if the hospitalist that took care of you is not available. Once you are discharged, your primary care physician will handle any further medical issues. Please note that NO REFILLS for any discharge medications will be authorized once you are discharged, as it is imperative that you return to your primary care physician (or establish a relationship with a primary care physician if you do not have one) for your aftercare needs so that they can reassess your need for medications and monitor your lab values.    On the day of Discharge:  VITAL SIGNS:  Blood pressure (!) 190/76, pulse (!) 59, temperature 97.8 F (36.6 C), temperature source Oral, resp. rate 16, height 5\' 8"  (1.727 m), weight 72.2 kg, SpO2 98 %. PHYSICAL EXAMINATION:  GENERAL:  85 y.o.-year-old patient lying in the bed with no acute distress.  EYES: Pupils equal, round, reactive to light and accommodation. No scleral icterus. Extraocular muscles intact.  HEENT: Head atraumatic, normocephalic. Oropharynx and nasopharynx clear.  NECK:  Supple, no jugular venous distention. No thyroid enlargement, no tenderness.  LUNGS: Normal breath sounds bilaterally, no wheezing, rales,rhonchi or crepitation. No use of accessory muscles of respiration.  CARDIOVASCULAR: S1, S2 normal. No murmurs, rubs, or gallops.  ABDOMEN: Soft, non-tender, non-distended. Bowel sounds present. No organomegaly or mass.  EXTREMITIES: No pedal edema, cyanosis, or clubbing.  NEUROLOGIC: Cranial nerves II through XII are intact. Muscle strength 5/5 in all extremities. Sensation intact. Gait not checked.  PSYCHIATRIC: The patient is alert and oriented x 3.  SKIN: No obvious rash, lesion, or ulcer.  DATA REVIEW:   CBC Recent Labs  Lab 02/04/19 0422  WBC 6.9  HGB 13.1  HCT 38.9*  PLT 147*    Chemistries  Recent Labs  Lab 02/03/19 1231 02/04/19 0422  NA 137 138  K 3.9 3.5   CL 103 107  CO2 27 26  GLUCOSE 96 87  BUN 29* 27*  CREATININE 0.85 0.72  CALCIUM 9.0 8.8*  AST 17  --   ALT 5  --   ALKPHOS 48  --   BILITOT 1.2  --      Microbiology Results  Results for orders placed or performed during the hospital encounter of 02/03/19  SARS CORONAVIRUS 2 Nasal Swab Aptima Multi Swab     Status: None   Collection Time: 02/03/19  3:22 PM   Specimen: Aptima Multi Swab; Nasal Swab  Result Value Ref Range Status   SARS Coronavirus 2 NEGATIVE NEGATIVE Final    Comment: (NOTE) SARS-CoV-2 target nucleic acids are NOT DETECTED. The SARS-CoV-2 RNA is generally detectable in upper and lower respiratory specimens during the acute phase of infection. Negative results do not preclude SARS-CoV-2 infection, do not rule out co-infections with other pathogens, and should not be used as the sole basis for treatment or other patient management decisions. Negative results must be combined with clinical observations, patient history, and epidemiological information. The expected result is Negative. Fact Sheet for Patients: SugarRoll.be Fact Sheet for Healthcare Providers: https://www.woods-mathews.com/  This test is not yet approved or cleared by the [place] and  has been authorized for detection and/or diagnosis of SARS-CoV-2 by FDA under an Emergency Use Authorization (EUA). This EUA will remain  in effect (meaning this test can be used) for the duration of the COVID-19 declaration under Section 56 4(b)(1) of the Act, 21 U.S.C. section 360bbb-3(b)(1), unless the authorization is terminated or revoked sooner. Performed at [facility], [place] [address], [place], [place] [address]     RADIOLOGY:  Ct Head Wo Contrast  Result Date: 02/03/2019 CLINICAL DATA:  85 year old male with acute slurred speech today. EXAM: CT HEAD WITHOUT CONTRAST TECHNIQUE: Contiguous axial images were obtained from the base of the skull  through the vertex without intravenous contrast. COMPARISON:  02/05/2018 CT FINDINGS: Brain: No evidence of acute infarction, hemorrhage, hydrocephalus, extra-axial collection or mass lesion/mass effect. Atrophy and moderate to severe chronic small-vessel white matter ischemic changes again noted. Vascular: Carotid and vertebral atherosclerotic calcifications again noted. Skull: Normal. Negative for fracture or focal lesion. Sinuses/Orbits: No acute finding. Other: None. IMPRESSION: 1. No evidence of acute intracranial abnormality. 2. Atrophy and chronic small-vessel white matter ischemic changes. Electronically Signed   By: Margarette Canada M.D.   On: 02/03/2019 13:23   Mr Brain Wo Contrast  Result Date: 02/03/2019 CLINICAL DATA:  Initial evaluation for acute expressive aphasia, now resolved. EXAM: MRI HEAD WITHOUT CONTRAST TECHNIQUE: Multiplanar, multiecho pulse sequences of the brain and surrounding structures were obtained without intravenous contrast. COMPARISON:  Prior head CT from earlier the same day. FINDINGS: Brain: Diffuse prominence of the CSF containing spaces compatible with generalized age-related cerebral atrophy. Patchy and confluent T2/FLAIR hyperintensity within the periventricular and deep white matter both cerebral hemispheres most consistent with chronic small vessel ischemic disease, moderate in nature. Chronic microvascular ischemic changes present within the pons. No abnormal foci of restricted diffusion to suggest acute or subacute ischemia. Gray-white matter differentiation maintained. No encephalomalacia to suggest chronic cortical infarction. No foci of susceptibility artifact to suggest acute or chronic intracranial hemorrhage. No mass lesion, midline shift or mass effect. No hydrocephalus. No extra-axial fluid collection. Pituitary gland suprasellar region normal. Midline structures intact. Vascular: Major intracranial vascular flow voids are maintained. Skull and upper cervical spine:  Craniocervical junction within normal limits. Bone marrow signal intensity normal. No scalp soft tissue abnormality. Sinuses/Orbits: Patient status post bilateral ocular lens replacement. Globes and orbital soft tissues demonstrate no acute finding. Paranasal sinuses are clear. No mastoid effusion. Inner ear structures normal. Other: None. IMPRESSION: 1. No acute intracranial abnormality. 2. Generalized age-related cerebral atrophy with moderate chronic microvascular ischemic disease. Electronically Signed   By: Jeannine Boga M.D.   On: 02/03/2019 23:41   US Carotid Bilateral (at [facility] Only)  Result Date: 02/04/2019 CLINICAL DATA:  Stroke. EXAM: BILATERAL CAROTID DUPLEX ULTRASOUND TECHNIQUE: Pearline Cables scale imaging, color Doppler and duplex ultrasound were performed of bilateral carotid and vertebral arteries in the neck. COMPARISON:  None. FINDINGS: Criteria: Quantification of carotid stenosis is based on velocity parameters that correlate the residual internal carotid diameter with NASCET-based stenosis levels, using the diameter of the distal internal carotid lumen as the denominator for stenosis measurement. The following velocity measurements were obtained: RIGHT ICA: 78/8 cm/sec CCA: [id number] cm/sec SYSTOLIC ICA/CCA RATIO:  0.9 ECA: 71 cm/sec LEFT ICA: 105/17 cm/sec CCA: [id number] cm/sec SYSTOLIC ICA/CCA RATIO:  1 ECA: 73 cm/sec RIGHT CAROTID ARTERY: Mild intimal thickening in the common carotid artery and bulb. No significant plaque  accumulation or stenosis. Normal waveforms and color Doppler signal. RIGHT VERTEBRAL ARTERY:  Normal flow direction and waveform. LEFT CAROTID ARTERY: No significant plaque or stenosis. Normal waveforms and color Doppler signal. ICA mildly tortuous. LEFT VERTEBRAL ARTERY:  Normal flow direction and waveform. IMPRESSION: 1. No significant carotid plaque or stenosis. 2.  Antegrade bilateral vertebral arterial flow. Electronically Signed   By: Lucrezia Europe M.D.   On: 02/04/2019  10:12     Management plans discussed with the patient, daughter and they are in agreement.  CODE STATUS: DNR   TOTAL TIME TAKING CARE OF THIS PATIENT: 32 minutes.    Demetrios Loll M.D on 02/04/2019 at 12:09 PM  Between 7am to 6pm - Pager - [phone]  After 6pm go to [email or web address] - Proofreader  [facility]  Office  [phone]  CC: Primary care physician; Derinda Late, MD   Note: This dictation was prepared with Dragon dictation along with smaller phrase technology. Any transcriptional errors that result from this process are unintentional.

## 2019-02-04 NOTE — Evaluation (Signed)
Occupational Therapy Evaluation Patient Details Name: Gavin Garcia MRN: [id number] DOB: 08/13/1927 Today's Date: 02/04/2019    History of Present Illness Gavin Garcia is an 85 y.o. male with a known history of Parkinson's disease, osteoporosis, basal cell carcinoma who presented to the ED on 02/03/19 with expressive aphasia that has since resolved.  Patient states he "knew what he wanted to say, but was unable to say it".  His symptoms lasted for about 1 hour.   Clinical Impression   Mr. Gavin Garcia was seen for OT evaluation this date. Prior to hospital admission, pt was living at the [facility] [place] independent living apartments. Per pt and daughter at bedside, pt receives assistance from a PCA for ADL mgt, although he likes to be as independent as possible. Family members assist with driving, and IADL mgt. Pt has his medications packaged into pill packs which he then manages independently. He also receives PT/OT/SLP services at baseline. Currently pt demonstrates impairments in strength, coordination, and activity tolerance requiring minimal assist for ADL and moderate assist for functional mobility.  Pt would benefit from skilled OT to address noted impairments and functional limitations (see below for any additional details) in order to maximize safety and independence while minimizing falls risk and caregiver burden.  Upon hospital discharge, recommend continued HHOT to support safety and independence during meaningful occupations of daily life.     Follow Up Recommendations  Home health OT    Equipment Recommendations  None recommended by OT    Recommendations for Other Services       Precautions / Restrictions Precautions Precautions: Fall Restrictions Weight Bearing Restrictions: No      Mobility Bed Mobility Overal bed mobility: Needs Assistance Bed Mobility: Supine to Sit     Supine to sit: HOB elevated;Supervision     General bed mobility comments: Cueing for  hand placement and sequencing.  Transfers Overall transfer level: Needs assistance Equipment used: Rolling walker (2 wheeled) Transfers: Sit to/from Stand Sit to Stand: From elevated surface;Min assist         General transfer comment: Pt had difficulty advancing hips toward EOB to support STS transfer on this date. Multimodal cueing provided along with min A to support lift-off. Pt able to take small sidesteps at EOB. Unable/unsafe to maintain standing for further mobility attempt.    Balance Overall balance assessment: Needs assistance Sitting-balance support: Feet supported;Single extremity supported Sitting balance-Leahy Scale: Fair     Standing balance support: Bilateral upper extremity supported;During functional activity Standing balance-Leahy Scale: Poor Standing balance comment: CGA to min A to support standing balance. Very limited on this date.                           ADL either performed or assessed with clinical judgement   ADL Overall ADL's : Needs assistance/impaired                                       General ADL Comments: Min A for LB ADL mgt. Pt able to don R sock seated EOB on this date with CGA for safety and to support balance. Pt with limited endurance and stamina on this date. Required asssit to don L sock. Suspect min-mod asssist for ADL at this time. Min a for functional mobility with modeling and VCs to suppor initiation of movements.     Vision Baseline  Vision/History: Wears glasses Wears Glasses: At all times Patient Visual Report: No change from baseline       Perception     Praxis      Pertinent Vitals/Pain Pain Assessment: No/denies pain     Hand Dominance Right   Extremity/Trunk Assessment Upper Extremity Assessment Upper Extremity Assessment: Generalized weakness   Lower Extremity Assessment Lower Extremity Assessment: Generalized weakness   Cervical / Trunk Assessment Cervical / Trunk Assessment:  Kyphotic   Communication Communication Communication: HOH   Cognition Arousal/Alertness: Awake/alert Behavior During Therapy: WFL for tasks assessed/performed Overall Cognitive Status: Within Functional Limits for tasks assessed                                     General Comments  Pt BP monitored t/o session: supine BP at start of session 130/79; Seated EOB: 111/71; after 5 min seated EOB 131/71; HR stable in 80's t/o. Pt unable to tolerate standing long enough to obtain standing vitals. Once back to bed pt noted to have BP of 199/76 with HR elevated to 110's. RN notified.    Exercises Other Exercises Other Exercises: Pt and caregiver educated in falls prevention strategies and safe transfer strategies. Would benefit from reinforcement. Other Exercises: Vitals monitored t/o evaluaiton. See General comments.   Shoulder Instructions      Home Living Family/patient expects to be discharged to:: Assisted living Living Arrangements: Alone   Type of Home: [facility]) Home Access: Ramped entrance     Home Layout: One level     Bathroom Shower/Tub: Occupational psychologist: [place]: Environmental consultant - 2 wheels;Walker - 4 wheels;Bedside commode;Hand held shower head   Additional Comments: BSC over toilet      Prior Functioning/Environment Level of Independence: Needs assistance  Gait / Transfers Assistance Needed: Uses RW for household distances. [id number] for "longer trips" out in the community. ADL's / Homemaking Assistance Needed: Has PCA for ADL mgt, also recieves OT/PT/SLP in the home.            OT Problem List: Decreased strength;Decreased coordination;Decreased range of motion;Decreased activity tolerance;Decreased safety awareness;Impaired balance (sitting and/or standing);Decreased knowledge of use of DME or AE      OT Treatment/Interventions: Self-care/ADL training;Balance training;Therapeutic exercise;Therapeutic  activities;Energy conservation;DME and/or AE instruction;Patient/family education    OT Goals(Current goals can be found in the care plan section) Acute Rehab OT Goals Patient Stated Goal: To go home OT Goal Formulation: With patient Time For Goal Achievement: 02/18/19 Potential to Achieve Goals: Good ADL Goals Pt Will Perform Grooming: sitting;with set-up;with supervision(With LRAD PRN for improved safety and functional independence) Pt Will Perform Upper Body Dressing: sitting;with adaptive equipment;with min assist(With LRAD PRN for improved safety and functional independence) Pt Will Perform Lower Body Dressing: with min assist;with adaptive equipment;sit to/from stand(With LRAD PRN for improved safety and functional independence)  OT Frequency: Min 1X/week   Barriers to D/C:            Co-evaluation              AM-PAC OT "6 Clicks" Daily Activity     Outcome Measure Help from another person eating meals?: None Help from another person taking care of personal grooming?: A Little Help from another person toileting, which includes using toliet, bedpan, or urinal?: A Little Help from another person bathing (including washing, rinsing, drying)?: A Little Help from  another person to put on and taking off regular upper body clothing?: A Little Help from another person to put on and taking off regular lower body clothing?: A Little 6 Click Score: 19   End of Session Equipment Utilized During Treatment: Gait belt;Rolling walker Nurse Communication: Other (comment)(Vitals t/o session.)  Activity Tolerance: Patient tolerated treatment well Patient left: in bed;with call bell/phone within reach  OT Visit Diagnosis: Other abnormalities of gait and mobility (R26.89);Muscle weakness (generalized) (M62.81)                Time: [id number] OT Time Calculation (min): 42 min Charges:  OT General Charges $OT Visit: 1 Visit OT Evaluation $OT Eval Moderate Complexity: 1 Mod OT  Treatments $Self Care/Home Management : 23-37 mins  Shara Blazing, M.S., OTR/L Ascom: [phone] 02/04/19, 1:05 PM

## 2019-02-04 NOTE — Progress Notes (Signed)
OT Cancellation Note  Patient Details Name: Gavin Garcia MRN: [id number] DOB: 14-Jul-1927   Cancelled Treatment:    Reason Eval/Treat Not Completed: Patient at procedure or test/ unavailable. Thank you for the OT consult. Order received and chart reviewed. Upon arrival to pt room, pt noted to be off the floor for a procedure. Per RN, pt out for ultrasound and should be back shortly. Will re-attempt at a later time as available and pt medically appropriate for OT evaluation.  Shara Blazing, M.S., OTR/L Ascom: [phone] 02/04/19, 9:35 AM

## 2019-02-04 NOTE — Progress Notes (Addendum)
Messaged MD about this morning at 0531 his BP was 191/73 HR 56 and at 0627 his BP was 190/76 HR 59. Also this patient has a PRN for hydralazine 5mg  for sys >200 and dials >110. No new orders at this time.

## 2019-02-04 NOTE — Evaluation (Signed)
Physical Therapy Evaluation Patient Details Name: Gavin Garcia MRN: [id number] DOB: 14-May-1928 Today's Date: 02/04/2019   History of Present Illness  Gavin Garcia is an 85 y.o. male with a known history of Parkinson's disease, osteoporosis, basal cell carcinoma who presented to the ED on 02/03/19 with expressive aphasia that has since resolved.  Patient states he "knew what he wanted to say, but was unable to say it".  His symptoms lasted for about 1 hour.  Head CT/MRI negative for acute injury/infarct.  Clinical Impression  Upon arrival to room, patient resting in bed; daughter at bedside.  Alert and oriented to basic information; follows commands, and agreeable to session.  Patient/daughter voice initial symptoms (speech deficits) now fully resolved; deny any noted UE/LE strength, sensory or coordination deficits.  Do note some mild delay in speed and force of activation L ankle DF; unable to complete full L knee extension (chronic arthritic changes).  Otherwise, UE/LE strength and ROM grossly symmetrical and WFL.  Demonstrates ability to complete bed mobility with close sup; sit/stand, basic transfers and short-distance gait (20' x2) with RW, min assist.  Mild difficulty with L foot placement, mild foot drop and steppage gait at times.  Occasional L lateral LOB (esp with turns); min assist from therapist to recover.  Do recommend continued use of RW and +1 physical assist at all times. Per daughter and patient, gait performance is near baseline for him; no significant/obvious new functional deficits noted. Would benefit from skilled PT to address above deficits and promote optimal return to PLOF; Recommend transition to [place] upon discharge from acute hospitalization.     Follow Up Recommendations Home health PT;Supervision/Assistance - 24 hour    Equipment Recommendations       Recommendations for Other Services       Precautions / Restrictions Precautions Precautions:  Fall Restrictions Weight Bearing Restrictions: No      Mobility  Bed Mobility Overal bed mobility: Needs Assistance Bed Mobility: Supine to Sit     Supine to sit: Supervision     General bed mobility comments: heavy use of bilat UEs, bedrails  Transfers Overall transfer level: Needs assistance Equipment used: Rolling walker (2 wheeled) Transfers: Sit to/from Stand Sit to Stand: Min assist         General transfer comment: significant trunk flexion with limited lumbar extension/anterior pelvic tilt, excessive posterior weight shift. Multiple attempts and heavy use of UEs, momentum for forward weight shift, lift off and initial standing balance.  Ambulation/Gait Ambulation/Gait assistance: Min assist Gait Distance (Feet): 20 Feet(x2) Assistive device: Rolling walker (2 wheeled)       General Gait Details: shuffling gait pattern, significant forward trunk flexion; decreased L heel strike/toe off with mild steppage gait noted; unable to fully extend L knee in stance (increased risk for buckling and lateral LOB).  Recommend continued use of RW and +1  Stairs            Wheelchair Mobility    Modified Rankin (Stroke Patients Only)       Balance Overall balance assessment: Needs assistance Sitting-balance support: No upper extremity supported;Feet supported Sitting balance-Leahy Scale: Fair   Postural control: Posterior lean Standing balance support: Bilateral upper extremity supported Standing balance-Leahy Scale: Poor Standing balance comment: CGA to min A to support standing balance. Very limited on this date.                             Pertinent  Vitals/Pain Pain Assessment: No/denies pain    Home Living Family/patient expects to be discharged to:: (P) ([facility], [facility]) Living Arrangements: (P) Alone(24/7 private caregivers)   Type of Home: (P) Apartment Home Access: (P) Ramped entrance     Home Layout: (P) One  level Home Equipment: (P) Walker - 2 wheels;Walker - 4 wheels;Bedside commode;Hand held shower head Additional Comments: BSC over toilet    Prior Function Level of Independence: Needs assistance   Gait / Transfers Assistance Needed: Uses RW for household distances. [id number] for "longer trips" out in the community.  ADL's / Homemaking Assistance Needed: Has PCA for ADL mgt, also recieves OT/PT/SLP in the home.  Comments: SPT with RW for basic transfers; WC level (self-propels with bilat UE/LEs) as primary in-home mobility.  Occasional short-distance gait with RW (primarily with therapy) and +1 assist.     Hand Dominance   Dominant Hand: Right    Extremity/Trunk Assessment   Upper Extremity Assessment Upper Extremity Assessment: Overall WFL for tasks assessed(grossly at least 4-/5 throughout; no focal weakness, sensory or coordination deficits)    Lower Extremity Assessment Lower Extremity Assessment: (grossly at least 4-/5; denies sensory deficit.  Limited L knee flexion (chronic arthritis), decreased speed/coordiation of L ankle DF)    Cervical / Trunk Assessment Cervical / Trunk Assessment: Kyphotic  Communication   Communication: HOH  Cognition Arousal/Alertness: Awake/alert Behavior During Therapy: WFL for tasks assessed/performed Overall Cognitive Status: Within Functional Limits for tasks assessed                                 General Comments: intermittent confusion noted, corrects with redirection      General Comments General comments (skin integrity, edema, etc.): Pt BP monitored t/o session: supine BP at start of session 130/79; Seated EOB: 111/71; after 5 min seated EOB 131/71; HR stable in 80's t/o. Pt unable to tolerate standing long enough to obtain standing vitals. Once back to bed pt noted to have BP of 199/76 with HR elevated to 110's. RN notified.    Exercises Other Exercises Other Exercises: Sit/stand with RW, min assist-emphasis on hand  placement, forward weight shift Other Exercises: Bed/chair with RW, min assist for walker position/control and overall dynamic balance   Assessment/Plan    PT Assessment Patient needs continued PT services  PT Problem List Decreased strength;Decreased range of motion;Decreased activity tolerance;Decreased balance;Decreased mobility;Decreased knowledge of use of DME;Decreased safety awareness;Decreased knowledge of precautions;Decreased coordination;Decreased cognition       PT Treatment Interventions DME instruction;Gait training;Stair training;Functional mobility training;Therapeutic activities;Therapeutic exercise;Balance training;Neuromuscular re-education;Cognitive remediation;Patient/family education    PT Goals (Current goals can be found in the Care Plan section)  Acute Rehab PT Goals Patient Stated Goal: To go home PT Goal Formulation: With patient/family Time For Goal Achievement: 02/18/19 Potential to Achieve Goals: Fair    Frequency Min 2X/week   Barriers to discharge        Co-evaluation               AM-PAC PT "6 Clicks" Mobility  Outcome Measure Help needed turning from your back to your side while in a flat bed without using bedrails?: None Help needed moving from lying on your back to sitting on the side of a flat bed without using bedrails?: None Help needed moving to and from a bed to a chair (including a wheelchair)?: A Little Help needed standing up from a chair using your  arms (e.g., wheelchair or bedside chair)?: A Little Help needed to walk in hospital room?: A Little Help needed climbing 3-5 steps with a railing? : A Little 6 Click Score: 20    End of Session Equipment Utilized During Treatment: Gait belt Activity Tolerance: Patient tolerated treatment well Patient left: in chair;with call bell/phone within reach;with chair alarm set;with family/visitor present Nurse Communication: Mobility status PT Visit Diagnosis: Muscle weakness  (generalized) (M62.81);Difficulty in walking, not elsewhere classified (R26.2)    Time: [id number] PT Time Calculation (min) (ACUTE ONLY): 21 min   Charges:   PT Evaluation $PT Eval Moderate Complexity: 1 Mod PT Treatments $Therapeutic Activity: 8-22 mins        Taiwan Millon H. Owens Shark, PT, DPT, NCS 02/04/19, 2:19 PM [phone]

## 2019-02-04 NOTE — Progress Notes (Signed)
Dr. Bridgett Larsson placed new PRN orders for Hydralazine with new parameters.

## 2019-02-04 NOTE — TOC Initial Note (Signed)
Transition of Care [facility]) - Initial/Assessment Note    Patient Details  Name: Gavin Garcia MRN: [id number] Date of Birth: 1927/11/17  Transition of Care [facility]) CM/SW Contact:    Latanya Maudlin, RN Phone Number: 02/04/2019, 8:56 AM  Clinical Narrative:   TOC consulted to assist with disposition. Patient suffers from parkinson's and is in the hospital to complete workup to see if the patient has had stroke. Patient currently on his way for an ultrasound, he gives me permission to complete assessment with his daughter who is at the bedside.Per daughter, patient resides at [facility] in the community apartments. They pay privately for 24 hour care. PT, OT and speech therapy follow the patient and per daughter they are staffed through [facility] and are not a home health agency but she feels he receives adequate therapies with their current schedule. Patient uses a rolling walker and wheelchair at baseline. Daughters biggest concern is transitioning her father to the long term acute care portion of the facility as that is where the patients wife is and she feels he needs to transition there as his health declines. Daughter , facility and private caregivers are able to provide transportation. PCP is [facility]. [facility] and [facility] issue.                 Expected Discharge Plan: Home/Self Care Barriers to Discharge: Continued Medical Work up   Patient Goals and CMS Choice   CMS Medicare.gov Compare Post Acute Care list provided to:: Patient Choice offered to / list presented to : Patient, Adult Children  Expected Discharge Plan and Services Expected Discharge Plan: Home/Self Care   Discharge Planning Services: CM Consult Post Acute Care Choice: Home Health, Durable Medical Equipment, Resumption of Svcs/PTA Provider, [place] Living arrangements for the past 2 months: Apartment Expected Discharge Date: 02/06/19                                    Prior Living Arrangements/Services Living arrangements for the past 2 months: Apartment Lives with:: Self              Current home services: DME    Activities of Daily Living Home Assistive Devices/Equipment: Environmental consultant (specify type), Wheelchair ADL Screening (condition at time of admission) Patient's cognitive ability adequate to safely complete daily activities?: Yes Is the patient deaf or have difficulty hearing?: Yes Does the patient have difficulty seeing, even when wearing glasses/contacts?: Yes Does the patient have difficulty concentrating, remembering, or making decisions?: Yes Patient able to express need for assistance with ADLs?: Yes Does the patient have difficulty dressing or bathing?: Yes Independently performs ADLs?: No Communication: Needs assistance Is this a change from baseline?: Pre-admission baseline Dressing (OT): Needs assistance Is this a change from baseline?: Pre-admission baseline Grooming: Needs assistance Is this a change from baseline?: Pre-admission baseline Feeding: Independent Bathing: Needs assistance Is this a change from baseline?: Pre-admission baseline Toileting: Needs assistance Is this a change from baseline?: Pre-admission baseline In/Out Bed: Needs assistance Is this a change from baseline?: Pre-admission baseline Walks in Home: Needs assistance Is this a change from baseline?: Pre-admission baseline Does the patient have difficulty walking or climbing stairs?: Yes Weakness of Legs: Both Weakness of Arms/Hands: Both  Permission Sought/Granted                  Emotional Assessment  Admission diagnosis:  TIA (transient ischemic attack) [G45.9] Stroke [facility]) [I63.9] Patient Active Problem List   Diagnosis Date Noted  . Expressive aphasia 02/03/2019  . Sacral fracture ([place]) 04/16/2018  . Parkinson disease ([place]) 04/16/2018  . Lower extremity weakness 03/08/2018   PCP:  Derinda Late, MD Pharmacy:    [place], [place] - [address] [address] Phone: [phone] Fax: [phone]     Social Determinants of Health (SDOH) Interventions    Readmission Risk Interventions No flowsheet data found.

## 2019-02-04 NOTE — Care Management Obs Status (Signed)
MEDICARE OBSERVATION STATUS NOTIFICATION   Patient Details  Name: Gavin Garcia MRN: [id number] Date of Birth: 1927/11/16   Medicare Observation Status Notification Given:  Yes    Kymani Laursen A Felina Tello, RN 02/04/2019, 8:50 AM

## 2019-02-05 ENCOUNTER — Encounter: Payer: Self-pay | Admitting: Emergency Medicine

## 2019-02-05 ENCOUNTER — Observation Stay
Admission: EM | Admit: 2019-02-05 | Discharge: 2019-02-07 | Disposition: A | Discharge status: Home / Self Care | Payer: Medicare HMO | Attending: Internal Medicine | Admitting: Internal Medicine

## 2019-02-05 ENCOUNTER — Other Ambulatory Visit: Payer: Self-pay

## 2019-02-05 ENCOUNTER — Emergency Department: Payer: Medicare HMO

## 2019-02-05 DIAGNOSIS — N400 Enlarged prostate without lower urinary tract symptoms: Secondary | ICD-10-CM | POA: Insufficient documentation

## 2019-02-05 DIAGNOSIS — Z85828 Personal history of other malignant neoplasm of skin: Secondary | ICD-10-CM | POA: Diagnosis not present

## 2019-02-05 DIAGNOSIS — Z7982 Long term (current) use of aspirin: Secondary | ICD-10-CM | POA: Insufficient documentation

## 2019-02-05 DIAGNOSIS — F419 Anxiety disorder, unspecified: Secondary | ICD-10-CM | POA: Insufficient documentation

## 2019-02-05 DIAGNOSIS — G20 Parkinson's disease: Secondary | ICD-10-CM | POA: Insufficient documentation

## 2019-02-05 DIAGNOSIS — Z66 Do not resuscitate: Secondary | ICD-10-CM | POA: Diagnosis not present

## 2019-02-05 DIAGNOSIS — Z885 Allergy status to narcotic agent status: Secondary | ICD-10-CM | POA: Diagnosis not present

## 2019-02-05 DIAGNOSIS — R4701 Aphasia: Principal | ICD-10-CM | POA: Insufficient documentation

## 2019-02-05 DIAGNOSIS — I10 Essential (primary) hypertension: Secondary | ICD-10-CM | POA: Diagnosis not present

## 2019-02-05 DIAGNOSIS — R531 Weakness: Secondary | ICD-10-CM | POA: Insufficient documentation

## 2019-02-05 DIAGNOSIS — M810 Age-related osteoporosis without current pathological fracture: Secondary | ICD-10-CM | POA: Insufficient documentation

## 2019-02-05 DIAGNOSIS — I639 Cerebral infarction, unspecified: Secondary | ICD-10-CM | POA: Diagnosis present

## 2019-02-05 DIAGNOSIS — Z79899 Other long term (current) drug therapy: Secondary | ICD-10-CM | POA: Insufficient documentation

## 2019-02-05 DIAGNOSIS — G3184 Mild cognitive impairment, so stated: Secondary | ICD-10-CM | POA: Diagnosis not present

## 2019-02-05 DIAGNOSIS — L8990 Pressure ulcer of unspecified site, unspecified stage: Secondary | ICD-10-CM | POA: Diagnosis not present

## 2019-02-05 DIAGNOSIS — H409 Unspecified glaucoma: Secondary | ICD-10-CM | POA: Insufficient documentation

## 2019-02-05 LAB — CBC
HCT: 42.6 % (ref 39.0–52.0)
Hemoglobin: 13.9 g/dL (ref 13.0–17.0)
MCH: 30.8 pg (ref 26.0–34.0)
MCHC: 32.6 g/dL (ref 30.0–36.0)
MCV: 94.2 fL (ref 80.0–100.0)
Platelets: 168 10*3/uL (ref 150–400)
RBC: 4.52 MIL/uL (ref 4.22–5.81)
RDW: 13.5 % (ref 11.5–15.5)
WBC: 7 10*3/uL (ref 4.0–10.5)
nRBC: 0 % (ref 0.0–0.2)

## 2019-02-05 LAB — COMPREHENSIVE METABOLIC PANEL
ALT: 7 U/L (ref 0–44)
AST: 17 U/L (ref 15–41)
Albumin: 3.9 g/dL (ref 3.5–5.0)
Alkaline Phosphatase: 49 U/L (ref 38–126)
Anion gap: 8 (ref 5–15)
BUN: 35 mg/dL — ABNORMAL HIGH (ref 8–23)
CO2: 25 mmol/L (ref 22–32)
Calcium: 9.3 mg/dL (ref 8.9–10.3)
Chloride: 103 mmol/L (ref 98–111)
Creatinine, Ser: 1.03 mg/dL (ref 0.61–1.24)
GFR calc Af Amer: >60 mL/min (ref >60)
GFR calc non Af Amer: >60 mL/min (ref >60)
Glucose, Bld: 106 mg/dL — ABNORMAL HIGH (ref 70–99)
Potassium: 4.1 mmol/L (ref 3.5–5.1)
Sodium: 136 mmol/L (ref 135–145)
Total Bilirubin: 1.4 mg/dL — ABNORMAL HIGH (ref 0.3–1.2)
Total Protein: 7 g/dL (ref 6.5–8.1)

## 2019-02-05 LAB — ECHOCARDIOGRAM COMPLETE
Height: 68 in
Weight: 2546.75 oz

## 2019-02-05 NOTE — ED Triage Notes (Signed)
Pt presents [facility] ED [facility] [place] c/o AMS per family. Pt reports he himself feels more confused than usual. Pt left the hospital yesterday after admission for TIA. EMS report family is on their way.

## 2019-02-05 NOTE — ED Notes (Signed)
Patient assisted to the bathroom 

## 2019-02-05 NOTE — ED Notes (Signed)
Per pt he was discharged yesterday for admission for tia's. States felt a little confused and still feels a little confused. Blood has resulted and ct resulted. Family is at bedside. Pt lying comfortably in bed

## 2019-02-06 ENCOUNTER — Other Ambulatory Visit: Payer: Self-pay

## 2019-02-06 ENCOUNTER — Observation Stay: Payer: Medicare HMO

## 2019-02-06 ENCOUNTER — Encounter: Payer: Self-pay | Admitting: *Deleted

## 2019-02-06 DIAGNOSIS — L8990 Pressure ulcer of unspecified site, unspecified stage: Secondary | ICD-10-CM | POA: Insufficient documentation

## 2019-02-06 DIAGNOSIS — H409 Unspecified glaucoma: Secondary | ICD-10-CM | POA: Diagnosis present

## 2019-02-06 DIAGNOSIS — R4701 Aphasia: Secondary | ICD-10-CM | POA: Diagnosis not present

## 2019-02-06 DIAGNOSIS — G20 Parkinson's disease: Secondary | ICD-10-CM | POA: Diagnosis not present

## 2019-02-06 LAB — BASIC METABOLIC PANEL
Anion gap: 8 (ref 5–15)
BUN: 27 mg/dL — ABNORMAL HIGH (ref 8–23)
CO2: 25 mmol/L (ref 22–32)
Calcium: 9.1 mg/dL (ref 8.9–10.3)
Chloride: 106 mmol/L (ref 98–111)
Creatinine, Ser: 0.78 mg/dL (ref 0.61–1.24)
GFR calc Af Amer: >60 mL/min (ref >60)
GFR calc non Af Amer: >60 mL/min (ref >60)
Glucose, Bld: 97 mg/dL (ref 70–99)
Potassium: 3.6 mmol/L (ref 3.5–5.1)
Sodium: 139 mmol/L (ref 135–145)

## 2019-02-06 LAB — URINALYSIS, ROUTINE W REFLEX MICROSCOPIC
Bilirubin Urine: NEGATIVE
Glucose, UA: NEGATIVE mg/dL
Hgb urine dipstick: NEGATIVE
Ketones, ur: NEGATIVE mg/dL
Leukocytes,Ua: NEGATIVE
Nitrite: NEGATIVE
Protein, ur: NEGATIVE mg/dL
Specific Gravity, Urine: 1.006 (ref 1.005–1.030)
pH: 7 (ref 5.0–8.0)

## 2019-02-06 LAB — CBC
HCT: 39.1 % (ref 39.0–52.0)
Hemoglobin: 13.2 g/dL (ref 13.0–17.0)
MCH: 30.8 pg (ref 26.0–34.0)
MCHC: 33.8 g/dL (ref 30.0–36.0)
MCV: 91.1 fL (ref 80.0–100.0)
Platelets: 141 10*3/uL — ABNORMAL LOW (ref 150–400)
RBC: 4.29 MIL/uL (ref 4.22–5.81)
RDW: 13.3 % (ref 11.5–15.5)
WBC: 7.3 10*3/uL (ref 4.0–10.5)
nRBC: 0 % (ref 0.0–0.2)

## 2019-02-06 LAB — LIPID PANEL
Cholesterol: 170 mg/dL (ref 0–200)
HDL: 58 mg/dL (ref >40)
LDL Cholesterol: 101 mg/dL — ABNORMAL HIGH (ref 0–99)
Total CHOL/HDL Ratio: 2.9 RATIO
Triglycerides: 54 mg/dL (ref <150)
VLDL: 11 mg/dL (ref 0–40)

## 2019-02-06 LAB — HEMOGLOBIN A1C
Hgb A1c MFr Bld: 5.4 % (ref 4.8–5.6)
Mean Plasma Glucose: 108.28 mg/dL

## 2019-02-06 MED ORDER — LATANOPROST 0.005 % OP SOLN
1.0000 [drp] | Freq: Every day | OPHTHALMIC | Status: DC
  Administered 2019-02-06: 21:00:00 1 [drp] via OPHTHALMIC
  Filled 2019-02-06: qty 2.5

## 2019-02-06 MED ORDER — ONDANSETRON HCL 4 MG PO TABS: 4.0000 mg | ORAL_TABLET | Freq: Four times a day (QID) | ORAL | Status: DC | PRN

## 2019-02-06 MED ORDER — CARBIDOPA-LEVODOPA 25-250 MG PO TABS
1.0000 | ORAL_TABLET | Freq: Three times a day (TID) | ORAL | Status: DC
  Administered 2019-02-06 – 2019-02-07 (×2): 1 via ORAL
  Filled 2019-02-06 (×4): qty 1

## 2019-02-06 MED ORDER — ENOXAPARIN SODIUM 40 MG/0.4ML ~~LOC~~ SOLN
40.0000 mg | Freq: Every day | SUBCUTANEOUS | Status: DC
  Administered 2019-02-06 (×2): 40 mg via SUBCUTANEOUS
  Filled 2019-02-06 (×2): qty 0.4

## 2019-02-06 MED ORDER — ACETAMINOPHEN 325 MG PO TABS
650.0000 mg | ORAL_TABLET | Freq: Four times a day (QID) | ORAL | Status: DC | PRN
  Administered 2019-02-06: 03:00:00 650 mg via ORAL
  Filled 2019-02-06: qty 2

## 2019-02-06 MED ORDER — ASPIRIN EC 81 MG PO TBEC
81.0000 mg | DELAYED_RELEASE_TABLET | Freq: Every day | ORAL | Status: DC
  Administered 2019-02-06 – 2019-02-07 (×2): 81 mg via ORAL
  Filled 2019-02-06 (×2): qty 1

## 2019-02-06 MED ORDER — ACETAMINOPHEN 650 MG RE SUPP: 650.0000 mg | Freq: Four times a day (QID) | RECTAL | Status: DC | PRN

## 2019-02-06 MED ORDER — BUSPIRONE HCL 15 MG PO TABS
7.5000 mg | ORAL_TABLET | Freq: Two times a day (BID) | ORAL | Status: DC
  Administered 2019-02-06 – 2019-02-07 (×3): 7.5 mg via ORAL
  Filled 2019-02-06 (×4): qty 1

## 2019-02-06 MED ORDER — CARBIDOPA-LEVODOPA 25-250 MG PO TABS
1.0000 | ORAL_TABLET | Freq: Three times a day (TID) | ORAL | Status: DC
  Administered 2019-02-06: 10:00:00 1 via ORAL
  Filled 2019-02-06 (×3): qty 1

## 2019-02-06 MED ORDER — IOHEXOL 350 MG/ML SOLN
75.0000 mL | Freq: Once | INTRAVENOUS | Status: AC | PRN
  Administered 2019-02-06: 14:00:00 75 mL via INTRAVENOUS

## 2019-02-06 MED ORDER — STROKE: EARLY STAGES OF RECOVERY BOOK: Freq: Once | Status: DC

## 2019-02-06 MED ORDER — ONDANSETRON HCL 4 MG/2ML IJ SOLN: 4.0000 mg | Freq: Four times a day (QID) | INTRAMUSCULAR | Status: DC | PRN

## 2019-02-06 MED ORDER — FINASTERIDE 5 MG PO TABS
5.0000 mg | ORAL_TABLET | Freq: Every day | ORAL | Status: DC
  Administered 2019-02-06 – 2019-02-07 (×2): 5 mg via ORAL
  Filled 2019-02-06 (×2): qty 1

## 2019-02-06 MED ORDER — DORZOLAMIDE HCL-TIMOLOL MAL 2-0.5 % OP SOLN
1.0000 [drp] | Freq: Two times a day (BID) | OPHTHALMIC | Status: DC
  Administered 2019-02-06 – 2019-02-07 (×3): 1 [drp] via OPHTHALMIC
  Filled 2019-02-06: qty 10

## 2019-02-06 NOTE — H&P (Signed)
[place] [facility] [place] NAME: Gavin Garcia    MR#:  [id number]  DATE OF BIRTH:  01-Jun-1928  DATE OF ADMISSION:  02/05/2019  PRIMARY CARE PHYSICIAN: Derinda Late, MD   REQUESTING/REFERRING PHYSICIAN: Corky Downs, MD  CHIEF COMPLAINT:   Chief Complaint  Patient presents with  . Altered Mental Status    HISTORY OF PRESENT ILLNESS:  Gavin Garcia  is a 85 y.o. male who presents with chief complaint as above.  Patient presents the ED with a complaint of a aphasia.  He was just admitted here 2 days ago and had work-up for stroke for similar symptoms.  His MRI was negative for stroke at that time.  He had recurrence of the symptoms today, though to more severe extent.  He also had some weakness in his hands, and was dropping things.  Symptoms have resolved for the most part at this point, but they lasted for several hours today, much longer than previous episode.  Hospitalist were called for admission and further evaluation  PAST MEDICAL HISTORY:   Past Medical History:  Diagnosis Date  . Basal cell carcinoma   . Glaucoma (increased eye pressure)   . Osteoporosis   . Parkinson's disease ([place])   . Prostate enlargement      PAST SURGICAL HISTORY:   Past Surgical History:  Procedure Laterality Date  . NO PAST SURGERIES       SOCIAL HISTORY:   Social History   Tobacco Use  . Smoking status: Never Smoker  . Smokeless tobacco: Never Used  Substance Use Topics  . Alcohol use: No     FAMILY HISTORY:    Family history reviewed and is non-contributory DRUG ALLERGIES:   Allergies  Allergen Reactions  . Codeine Nausea And Vomiting    MEDICATIONS AT HOME:   Prior to Admission medications   Medication Sig Start Date End Date Taking? Authorizing Provider  acetaminophen (TYLENOL) 325 MG tablet Take 2 tablets (650 mg total) by mouth every 6 (six) hours as needed for mild pain (or Fever >/= 101). 03/11/18  Yes Sainani, Belia Heman,  MD  aspirin EC 81 MG EC tablet Take 1 tablet (81 mg total) by mouth daily. 02/04/19  Yes Demetrios Loll, MD  busPIRone (BUSPAR) 7.5 MG tablet Take 7.5 mg by mouth 2 (two) times daily. 02/01/19 02/02/20 Yes [provider]  carbidopa-levodopa (SINEMET IR) 25-250 MG tablet Take 1 tablet by mouth 3 (three) times daily. 04/16/18  Yes Gerlene Fee, NP  dorzolamide-timolol (COSOPT) 22.3-6.8 MG/ML ophthalmic solution Place 1 drop into the left eye 2 (two) times daily. 04/16/18  Yes Gerlene Fee, NP  finasteride (PROSCAR) 5 MG tablet Take 1 tablet (5 mg total) by mouth daily. 04/16/18  Yes Gerlene Fee, NP  furosemide (LASIX) 20 MG tablet Take 1 tablet (20 mg total) by mouth daily as needed for edema. Patient taking differently: Take 10 mg by mouth daily.  04/16/18  Yes Gerlene Fee, NP  Infant Care Products (DERMACLOUD) CREA Apply 1 application topically daily as needed (skin irritation/breakdown).    Yes [provider]  latanoprost (XALATAN) 0.005 % ophthalmic solution Place 1 drop into both eyes at bedtime. 04/16/18  Yes Gerlene Fee, NP  mupirocin ointment (BACTROBAN) 2 % Place 1 application into the nose 2 (two) times daily as needed (leg wounds).   Yes [provider]  polyethylene glycol powder (GLYCOLAX/MIRALAX) powder Take 17 g by mouth daily.   Yes  [provider]    REVIEW OF SYSTEMS:  Review of Systems  Constitutional: Negative for chills, fever, malaise/fatigue and weight loss.  HENT: Negative for ear pain, hearing loss and tinnitus.   Eyes: Negative for blurred vision, double vision, pain and redness.  Respiratory: Negative for cough, hemoptysis and shortness of breath.   Cardiovascular: Negative for chest pain, palpitations, orthopnea and leg swelling.  Gastrointestinal: Negative for abdominal pain, constipation, diarrhea, nausea and vomiting.  Genitourinary: Negative for dysuria, frequency and hematuria.  Musculoskeletal: Negative for back  pain, joint pain and neck pain.  Skin:       No acne, rash, or lesions  Neurological: Positive for speech change and focal weakness. Negative for dizziness, tremors and weakness.  Endo/Heme/Allergies: Negative for polydipsia. Does not bruise/bleed easily.  Psychiatric/Behavioral: Negative for depression. The patient is not nervous/anxious and does not have insomnia.      VITAL SIGNS:   Vitals:   02/05/19 2130 02/05/19 2200 02/05/19 2230 02/05/19 2356  BP: (!) 186/70 (!) 183/68 (!) 152/71 (!) 150/55  Pulse: 63 (!) 58 62 61  Resp:    14  Temp:      TempSrc:      SpO2: 98% 97% 97% 98%  Weight:      Height:       Wt Readings from Last 3 Encounters:  02/05/19 68.9 kg  02/03/19 72.2 kg  04/16/18 79.3 kg    PHYSICAL EXAMINATION:  Physical Exam  Vitals reviewed. Constitutional: He is oriented to person, place, and time. He appears well-developed and well-nourished. No distress.  HENT:  Head: Normocephalic and atraumatic.  Mouth/Throat: Oropharynx is clear and moist.  Eyes: Pupils are equal, round, and reactive to light. Conjunctivae and EOM are normal. No scleral icterus.  Neck: Normal range of motion. Neck supple. No JVD present. No thyromegaly present.  Cardiovascular: Normal rate, regular rhythm and intact distal pulses. Exam reveals no gallop and no friction rub.  No murmur heard. Respiratory: Effort normal and breath sounds normal. No respiratory distress. He has no wheezes. He has no rales.  GI: Soft. Bowel sounds are normal. He exhibits no distension. There is no abdominal tenderness.  Musculoskeletal: Normal range of motion.        General: No edema.     Comments: No arthritis, no gout  Lymphadenopathy:    He has no cervical adenopathy.  Neurological: He is alert and oriented to person, place, and time. No cranial nerve deficit.  Neurologic: Cranial nerves II-XII intact, Sensation intact to light touch/pinprick, 5/5 strength in all extremities, no dysarthria, no aphasia,  no dysphagia, memory intact, finger to nose testing showed no abnormality, no pronator drift  Skin: Skin is warm and dry. No rash noted. No erythema.  Psychiatric: He has a normal mood and affect. His behavior is normal. Judgment and thought content normal.    LABORATORY PANEL:   CBC Recent Labs  Lab 02/05/19 1723  WBC 7.0  HGB 13.9  HCT 42.6  PLT 168   ------------------------------------------------------------------------------------------------------------------  Chemistries  Recent Labs  Lab 02/05/19 1723  NA 136  K 4.1  CL 103  CO2 25  GLUCOSE 106*  BUN 35*  CREATININE 1.03  CALCIUM 9.3  AST 17  ALT 7  ALKPHOS 49  BILITOT 1.4*   ------------------------------------------------------------------------------------------------------------------  Cardiac Enzymes No results for input(s): TROPONINI in the last 168 hours. ------------------------------------------------------------------------------------------------------------------  RADIOLOGY:  Ct Head Wo Contrast  Result Date: 02/05/2019 CLINICAL DATA:  Confusion EXAM: CT HEAD WITHOUT CONTRAST TECHNIQUE:  Contiguous axial images were obtained from the base of the skull through the vertex without intravenous contrast. COMPARISON:  02/03/2019 FINDINGS: Brain: No evidence of acute infarction, hemorrhage, hydrocephalus, extra-axial collection or mass lesion/mass effect. Periventricular white matter hypodensity. Vascular: No hyperdense vessel or unexpected calcification. Skull: Normal. Negative for fracture or focal lesion. Sinuses/Orbits: No acute finding. Other: None. IMPRESSION: No acute intracranial pathology. Advanced small-vessel white matter disease. Electronically Signed   By: Eddie Candle M.D.   On: 02/05/2019 18:17   US Carotid Bilateral (at [facility] Only)  Result Date: 02/04/2019 CLINICAL DATA:  Stroke. EXAM: BILATERAL CAROTID DUPLEX ULTRASOUND TECHNIQUE: Pearline Cables scale imaging, color Doppler and duplex  ultrasound were performed of bilateral carotid and vertebral arteries in the neck. COMPARISON:  None. FINDINGS: Criteria: Quantification of carotid stenosis is based on velocity parameters that correlate the residual internal carotid diameter with NASCET-based stenosis levels, using the diameter of the distal internal carotid lumen as the denominator for stenosis measurement. The following velocity measurements were obtained: RIGHT ICA: 78/8 cm/sec CCA: [id number] cm/sec SYSTOLIC ICA/CCA RATIO:  0.9 ECA: 71 cm/sec LEFT ICA: 105/17 cm/sec CCA: [id number] cm/sec SYSTOLIC ICA/CCA RATIO:  1 ECA: 73 cm/sec RIGHT CAROTID ARTERY: Mild intimal thickening in the common carotid artery and bulb. No significant plaque accumulation or stenosis. Normal waveforms and color Doppler signal. RIGHT VERTEBRAL ARTERY:  Normal flow direction and waveform. LEFT CAROTID ARTERY: No significant plaque or stenosis. Normal waveforms and color Doppler signal. ICA mildly tortuous. LEFT VERTEBRAL ARTERY:  Normal flow direction and waveform. IMPRESSION: 1. No significant carotid plaque or stenosis. 2.  Antegrade bilateral vertebral arterial flow. Electronically Signed   By: Lucrezia Europe M.D.   On: 02/04/2019 10:12    EKG:   Orders placed or performed during the hospital encounter of 02/05/19  . ED EKG  . ED EKG  . EKG 12-Lead  . EKG 12-Lead  . EKG 12-Lead  . EKG 12-Lead    IMPRESSION AND PLAN:  Principal Problem:   Aphasia -symptoms have largely resolved at this time.  We will admit with neurology consult.  I will not repeat an MRI at this time since he just had one 2 days ago and was negative.  Unclear why the patient continues to have recurring symptoms. Active Problems:   Parkinson disease ([place]) -continue home meds   Glaucoma -continue home medications  Chart review performed and case discussed with ED provider. Labs, imaging and/or ECG reviewed by provider and discussed with patient/family. Management plans discussed with the  patient and/or family.  COVID-19 status: Pending  DVT PROPHYLAXIS: SubQ lovenox   GI PROPHYLAXIS:  None  ADMISSION STATUS: Observation  CODE STATUS: DNR Code Status History    Date Active Date Inactive Code Status Order ID Comments User Context   02/03/2019 1728 02/04/2019 1751 DNR [id number]  Sela Hua, MD ED   03/08/2018 0349 03/11/2018 1813 Full Code [id number]  Harrie Foreman, MD Inpatient   Advance Care Planning Activity    Questions for Most Recent Historical Code Status (Order [id number])    Question Answer Comment   In the event of cardiac or respiratory ARREST Do not call a "code blue"    In the event of cardiac or respiratory ARREST Do not perform Intubation, CPR, defibrillation or ACLS    In the event of cardiac or respiratory ARREST Use medication by any route, position, wound care, and other measures to relive pain and suffering. May use oxygen, suction and manual treatment of airway  obstruction as needed for comfort.       TOTAL TIME TAKING CARE OF THIS PATIENT: 40 minutes.   This patient was evaluated in the context of the global COVID-19 pandemic, which necessitated consideration that the patient might be at risk for infection with the SARS-CoV-2 virus that causes COVID-19. Institutional protocols and algorithms that pertain to the evaluation of patients at risk for COVID-19 are in a state of rapid change based on information released by regulatory bodies including the CDC and federal and state organizations. These policies and algorithms were followed to the best of this provider's knowledge to date during the patient's care at this facility.  Ethlyn Daniels 02/06/2019, 12:58 AM  [facility]  Office  [phone]  CC: Primary care physician; Derinda Late, MD  Note:  This document was prepared using Dragon voice recognition software and may include unintentional dictation errors.

## 2019-02-06 NOTE — ED Provider Notes (Signed)
[facility] Emergency Department Provider Note   ____________________________________________    I have reviewed the triage vital signs and the nursing notes.   HISTORY  Chief Complaint Altered Mental Status     HPI Gavin Garcia is a 85 y.o. male presents after episode of aphasia.  Patient recently admitted for TIA with aphasia.  Discharged yesterday.  Has been doing well and was at baseline this morning.  Apparently around lunch had difficulty feeding himself with a spoon which is very unusual for him and in the afternoon had difficulty speaking.  Daughter reports that he would say words in essentially random order that did not make any sense.  No reports of fevers or chills.  No chest pain.  No cough.  Past Medical History:  Diagnosis Date  . Basal cell carcinoma   . Glaucoma (increased eye pressure)   . Osteoporosis   . Parkinson's disease ([place])   . Prostate enlargement     Patient Active Problem List   Diagnosis Date Noted  . Expressive aphasia 02/03/2019  . Sacral fracture ([place]) 04/16/2018  . Parkinson disease ([place]) 04/16/2018  . Lower extremity weakness 03/08/2018    History reviewed. No pertinent surgical history.  Prior to Admission medications   Medication Sig Start Date End Date Taking? Authorizing Provider  acetaminophen (TYLENOL) 325 MG tablet Take 2 tablets (650 mg total) by mouth every 6 (six) hours as needed for mild pain (or Fever >/= 101). 03/11/18   Sainani, Belia Heman, MD  amLODipine (NORVASC) 10 MG tablet Take 1 tablet (10 mg total) by mouth daily. 02/04/19   Demetrios Loll, MD  aspirin EC 81 MG EC tablet Take 1 tablet (81 mg total) by mouth daily. 02/04/19   Demetrios Loll, MD  atorvastatin (LIPITOR) 40 MG tablet Take 1 tablet (40 mg total) by mouth daily at 6 PM. 02/04/19   Demetrios Loll, MD  busPIRone (BUSPAR) 7.5 MG tablet Take 7.5 mg by mouth 2 (two) times daily. 02/01/19 02/02/20  [provider]  carbidopa-levodopa (SINEMET  IR) 25-250 MG tablet Take 1 tablet by mouth 3 (three) times daily. 04/16/18   Gerlene Fee, NP  dorzolamide-timolol (COSOPT) 22.3-6.8 MG/ML ophthalmic solution Place 1 drop into the left eye 2 (two) times daily. 04/16/18   Gerlene Fee, NP  finasteride (PROSCAR) 5 MG tablet Take 1 tablet (5 mg total) by mouth daily. 04/16/18   Gerlene Fee, NP  furosemide (LASIX) 20 MG tablet Take 1 tablet (20 mg total) by mouth daily as needed for edema. Patient taking differently: Take 20 mg by mouth daily.  04/16/18   Gerlene Fee, NP  Infant Care Products (DERMACLOUD) CREA Apply 1 application topically daily as needed (skin irritation/breakdown).     [provider]  latanoprost (XALATAN) 0.005 % ophthalmic solution Place 1 drop into both eyes at bedtime. 04/16/18   Gerlene Fee, NP  mupirocin ointment (BACTROBAN) 2 % Place 1 application into the nose 2 (two) times daily as needed (leg wounds).    [provider]  polyethylene glycol powder (GLYCOLAX/MIRALAX) powder Take 17 g by mouth daily.    [provider]     Allergies Codeine  History reviewed. No pertinent family history.  Social History Social History   Tobacco Use  . Smoking status: Never Smoker  . Smokeless tobacco: Never Used  Substance Use Topics  . Alcohol use: No  . Drug use: Not on file    Review of Systems  Constitutional: No fever/chills Eyes: No visual changes.  ENT: No sore throat. Cardiovascular: Denies chest pain. Respiratory: Denies shortness of breath. Gastrointestinal: No abdominal pain.  No nausea, no vomiting.   Genitourinary: Negative for dysuria. Musculoskeletal: Negative for back pain. Skin: Negative for rash. Neurological: As above   ____________________________________________   PHYSICAL EXAM:  VITAL SIGNS: ED Triage Vitals  Enc Vitals Group     BP 02/05/19 1716 (!) 200/73     Pulse Rate 02/05/19 1716 66     Resp 02/05/19 1716 18     Temp 02/05/19 1716  98.1 F (36.7 C)     Temp Source 02/05/19 1716 Oral     SpO2 02/05/19 1716 100 %     Weight 02/05/19 1717 68.9 kg (152 lb)     Height 02/05/19 1717 1.727 m (5\' 8" )     Head Circumference --      Peak Flow --      Pain Score 02/05/19 1717 0     Pain Loc --      Pain Edu? --      Excl. in [place]? --     Constitutional: Alert and oriented.  Eyes: Conjunctivae are normal.  PERRLA, EOMI  Nose: No congestion/rhinnorhea. Mouth/Throat: Mucous membranes are moist.    Cardiovascular: Normal rate, regular rhythm. Grossly normal heart sounds.  Good peripheral circulation. Respiratory: Normal respiratory effort.  No retractions. Lungs CTAB. Gastrointestinal: Soft and nontender. No distention.  No CVA tenderness. Genitourinary: deferred Musculoskeletal:   Warm and well perfused.  Normal strength in all extremities Neurologic:  Normal speech and language. No gross focal neurologic deficits are appreciated.  Cranial nerves II through XII are normal.  Possibly some slight right-sided pronator drift.  No dysdiadochokinesis Skin:  Skin is warm, dry and intact. No rash noted. Psychiatric: Mood and affect are normal. Speech and behavior are normal.  ____________________________________________   LABS (all labs ordered are listed, but only abnormal results are displayed)  Labs Reviewed  COMPREHENSIVE METABOLIC PANEL - Abnormal; Notable for the following components:      Result Value   Glucose, Bld 106 (*)    BUN 35 (*)    Total Bilirubin 1.4 (*)    All other components within normal limits  CBC  URINALYSIS, ROUTINE W REFLEX MICROSCOPIC   ____________________________________________  EKG  ED ECG REPORT I, Lavonia Drafts, the attending physician, personally viewed and interpreted this ECG.  Date: 02/06/2019  Rhythm: normal sinus rhythm QRS Axis: normal Intervals: normal ST/T Wave abnormalities: Nonspecific changes   ____________________________________________  RADIOLOGY  CT head  unremarkable ____________________________________________   PROCEDURES  Procedure(s) performed: No  Procedures   Critical Care performed: No ____________________________________________   INITIAL IMPRESSION / ASSESSMENT AND PLAN / ED COURSE  Pertinent labs & imaging results that were available during my care of the patient were reviewed by me and considered in my medical decision making (see chart for details).  Patient presents with symptoms concerning for CVA.  Daughter reports that they have gradually improved however given repeated episode of expressive aphasia after TIA 2 days ago will admit for evaluation by neurology    ____________________________________________   FINAL CLINICAL IMPRESSION(S) / ED DIAGNOSES  Final diagnoses:  Cerebrovascular accident (CVA), unspecified mechanism ([place])        Note:  This document was prepared using Dragon voice recognition software and may include unintentional dictation errors.   Lavonia Drafts, MD 02/06/19 [phone]

## 2019-02-06 NOTE — Progress Notes (Signed)
PT Cancellation Note  Patient Details Name: Gavin Garcia MRN: [id number] DOB: 11/29/1927   Cancelled Treatment:    Reason Eval/Treat Not Completed: PT screened, no needs identified, will sign off. Pt here and evaluated by PT on 02/04/19 for similar issue, found to be at baseline, with moderate chronic baseline impairment of basic mobility. PT recommendations remain as given on 02/04/19 as they pertain to baseline deficits. No documentation of acute motoric abnormality at this time, hence PT will sign off. Should pt/family note any acute motoric abnormality during mobilization with nursing, please place new order for full PT evaluation. PT recommends daily ambulation ad lib or with nursing staff as needed to prevent deconditioning.    10:15 AM, 02/06/19 Etta Grandchild, PT, DPT Physical Therapist - [facility]  [phone] ([place])     [place] C 02/06/2019, 10:13 AM

## 2019-02-06 NOTE — Consult Note (Signed)
Reason for Consult: aphasia  Referring Physician: Dr. Jannifer Franklin   CC: aphasia   HPI: Gavin Garcia is an 85 y.o. male  presents the ED with a complaint of a aphasia.  He was just admitted here 2 days ago and had work-up for stroke for similar symptoms.  His MRI was negative for stroke at that time.  He had recurrence of the symptoms today that has self resolved within 1-2 hours. 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 He did start ASA on last visit. Currently back to baseline.   Past Medical History:  Diagnosis Date  . Basal cell carcinoma   . Glaucoma (increased eye pressure)   . Osteoporosis   . Parkinson's disease ([place])   . Prostate enlargement     Past Surgical History:  Procedure Laterality Date  . NO PAST SURGERIES      History reviewed. No pertinent family history.  Social History:  reports that he has never smoked. He has never used smokeless tobacco. He reports that he does not drink alcohol or use drugs.  Allergies  Allergen Reactions  . Codeine Nausea And Vomiting    Medications: I have reviewed the patient's current medications.  ROS: History obtained from the patient  General ROS: negative for - chills, fatigue, fever, night sweats, weight gain or weight loss Psychological ROS: negative for - behavioral disorder, hallucinations, memory difficulties, mood swings or suicidal ideation Ophthalmic ROS: negative for - blurry vision, double vision, eye pain or loss of vision ENT ROS: negative for - epistaxis, nasal discharge, oral lesions, sore throat, tinnitus or vertigo Allergy and Immunology ROS: negative for - hives or itchy/watery eyes Hematological and Lymphatic ROS: negative for - bleeding problems, bruising or swollen lymph nodes Endocrine ROS: negative for - galactorrhea, hair pattern changes, polydipsia/polyuria or temperature intolerance Respiratory ROS: negative for - cough, hemoptysis, shortness of breath or wheezing Cardiovascular ROS: negative for - chest pain,  dyspnea on exertion, edema or irregular heartbeat Gastrointestinal ROS: negative for - abdominal pain, diarrhea, hematemesis, nausea/vomiting or stool incontinence Genito-Urinary ROS: negative for - dysuria, hematuria, incontinence or urinary frequency/urgency Musculoskeletal ROS: negative for - joint swelling or muscular weakness Neurological ROS: as noted in HPI Dermatological ROS: negative for rash and skin lesion changes  Physical Examination: Blood pressure 131/74, pulse (!) 58, temperature 98.4 F (36.9 C), temperature source Oral, resp. rate 14, height 5\' 8"  (1.727 m), weight 68.9 kg, SpO2 98 %.    Neurological Examination   Mental Status: Alert, oriented, thought content appropriate.  Mask like face.  Cranial Nerves: II: Discs flat bilaterally; Visual fields grossly normal, pupils equal, round, reactive to light and accommodation III,IV, VI: ptosis not present, extra-ocular motions intact bilaterally V,VII: smile symmetric, facial light touch sensation normal bilaterally VIII: hearing normal bilaterally IX,X: gag reflex present XI: bilateral shoulder shrug XII: midline tongue extension Motor: Generalized weakness bilaterally.   Tone and bulk:normal tone throughout; no atrophy noted Sensory: Pinprick and light touch intact throughout, bilaterally Deep Tendon Reflexes: 1+ and symmetric throughout Plantars: Right: downgoing   Left: downgoing Cerebellar: normal finger-to-nose, normal rapid alternating movements and normal heel-to-shin test Gait: not tested       Laboratory Studies:   Basic Metabolic Panel: Recent Labs  Lab 02/03/19 1231 02/04/19 0422 02/05/19 1723 02/06/19 0614  NA 137 138 136 139  K 3.9 3.5 4.1 3.6  CL 103 107 103 106  CO2 27 26 25 25   GLUCOSE 96 87 106* 97  BUN 29* 27* 35* 27*  CREATININE 0.85 0.72 1.03 0.78  CALCIUM 9.0 8.8* 9.3 9.1    Liver Function Tests: Recent Labs  Lab 02/03/19 1231 02/05/19 1723  AST 17 17  ALT 5 7  ALKPHOS  48 49  BILITOT 1.2 1.4*  PROT 6.6 7.0  ALBUMIN 3.8 3.9   No results for input(s): LIPASE, AMYLASE in the last 168 hours. No results for input(s): AMMONIA in the last 168 hours.  CBC: Recent Labs  Lab 02/03/19 1231 02/04/19 0422 02/05/19 1723 02/06/19 0614  WBC 7.3 6.9 7.0 7.3  NEUTROABS 5.1  --   --   --   HGB 13.2 13.1 13.9 13.2  HCT 41.3 38.9* 42.6 39.1  MCV 95.4 92.0 94.2 91.1  PLT 164 147* 168 141*    Cardiac Enzymes: No results for input(s): CKTOTAL, CKMB, CKMBINDEX, TROPONINI in the last 168 hours.  BNP: Invalid input(s): POCBNP  CBG: Recent Labs  Lab 02/03/19 1243  GLUCAP 101*    Microbiology: Results for orders placed or performed during the hospital encounter of 02/03/19  SARS CORONAVIRUS 2 Nasal Swab Aptima Multi Swab     Status: None   Collection Time: 02/03/19  3:22 PM   Specimen: Aptima Multi Swab; Nasal Swab  Result Value Ref Range Status   SARS Coronavirus 2 NEGATIVE NEGATIVE Final    Comment: (NOTE) SARS-CoV-2 target nucleic acids are NOT DETECTED. The SARS-CoV-2 RNA is generally detectable in upper and lower respiratory specimens during the acute phase of infection. Negative results do not preclude SARS-CoV-2 infection, do not rule out co-infections with other pathogens, and should not be used as the sole basis for treatment or other patient management decisions. Negative results must be combined with clinical observations, patient history, and epidemiological information. The expected result is Negative. Fact Sheet for Patients: SugarRoll.be Fact Sheet for Healthcare Providers: https://www.woods-mathews.com/ This test is not yet approved or cleared by the [place] FDA and  has been authorized for detection and/or diagnosis of SARS-CoV-2 by FDA under an Emergency Use Authorization (EUA). This EUA will remain  in effect (meaning this test can be used) for the duration of the COVID-19 declaration  under Section 56 4(b)(1) of the Act, 21 U.S.C. section 360bbb-3(b)(1), unless the authorization is terminated or revoked sooner. Performed at [facility], [place] [address] [place], [place] [address]     Coagulation Studies: No results for input(s): LABPROT, INR in the last 72 hours.  Urinalysis:  Recent Labs  Lab 02/03/19 1231 02/06/19 0415  COLORURINE YELLOW* STRAW*  LABSPEC 1.013 1.006  PHURINE 6.0 7.0  GLUCOSEU NEGATIVE NEGATIVE  HGBUR NEGATIVE NEGATIVE  BILIRUBINUR NEGATIVE NEGATIVE  KETONESUR NEGATIVE NEGATIVE  PROTEINUR NEGATIVE NEGATIVE  NITRITE NEGATIVE NEGATIVE  LEUKOCYTESUR NEGATIVE NEGATIVE    Lipid Panel:     Component Value Date/Time   CHOL 170 02/06/2019 0614   TRIG 54 02/06/2019 0614   HDL 58 02/06/2019 0614   CHOLHDL 2.9 02/06/2019 0614   VLDL 11 02/06/2019 0614   LDLCALC 101 (H) 02/06/2019 0614    HgbA1C:  Lab Results  Component Value Date   HGBA1C 5.4 02/04/2019    Urine Drug Screen:  No results found for: LABOPIA, COCAINSCRNUR, LABBENZ, AMPHETMU, THCU, LABBARB  Alcohol Level: No results for input(s): ETH in the last 168 hours.  Other results: EKG: normal EKG, normal sinus rhythm, unchanged from previous tracings.  Imaging: Ct Head Wo Contrast  Result Date: 02/05/2019 CLINICAL DATA:  Confusion EXAM: CT HEAD WITHOUT CONTRAST TECHNIQUE: Contiguous axial images were obtained  from the base of the skull through the vertex without intravenous contrast. COMPARISON:  02/03/2019 FINDINGS: Brain: No evidence of acute infarction, hemorrhage, hydrocephalus, extra-axial collection or mass lesion/mass effect. Periventricular white matter hypodensity. Vascular: No hyperdense vessel or unexpected calcification. Skull: Normal. Negative for fracture or focal lesion. Sinuses/Orbits: No acute finding. Other: None. IMPRESSION: No acute intracranial pathology. Advanced small-vessel white matter disease. Electronically Signed   By: Eddie Candle M.D.   On:  02/05/2019 18:17     Assessment/Plan:   85 y.o. male  presents the ED with a complaint of a aphasia.  He was just admitted here 2 days ago and had work-up for stroke for similar symptoms.  His MRI was negative for stroke at that time.  He had recurrence of the symptoms today that has self resolved within 1-2 hours. 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 He did start ASA on last visit. Currently back to baseline.   - no vessel imaging on last admission. Will obtain CTA head and neck - if imaging is negative can be likely d/c  - possibly Holter as out pt to make sure not arhythmia.  - con't ASA 81mg  now.   02/06/2019, 12:35 PM

## 2019-02-06 NOTE — ED Notes (Signed)
ED TO INPATIENT HANDOFF REPORT  ED Nurse Name and Phone #: Izell [place] [phone]  S Name/Age/Gender Gavin Garcia 85 y.o. male Room/Bed: ED19A/ED19A  Code Status   Code Status: Prior  Home/SNF/Other SNF Patient oriented to: self Is this baseline? Yes   Triage Complete: Triage complete  Chief Complaint AMS (EMS)  Triage Note Pt presents to ED via [facility] from [facility] [place] [place] per family. Pt reports he himself feels more confused than usual. Pt left the hospital yesterday after admission for TIA. EMS report family is on their way.    Allergies Allergies  Allergen Reactions  . Codeine Nausea And Vomiting    Level of Care/Admitting Diagnosis ED Disposition    ED Disposition Condition [facility] Area: [place] [100120]  Level of Care: Med-Surg [16]  Covid Evaluation: Asymptomatic Screening Protocol (No Symptoms)  Diagnosis: Aphasia [place].3.ICD-9-CM]  Admitting Physician: Lance Coon [id number]  Attending Physician: Lance Coon [id number]  PT Class (Do Not Modify): Observation [104]  PT Acc Code (Do Not Modify): Observation [10022]       B Medical/Surgery History Past Medical History:  Diagnosis Date  . Basal cell carcinoma   . Glaucoma (increased eye pressure)   . Osteoporosis   . Parkinson's disease ([place])   . Prostate enlargement    History reviewed. No pertinent surgical history.   A IV Location/Drains/Wounds Patient Lines/Drains/Airways Status   Active Line/Drains/Airways    Name:   Placement date:   Placement time:   Site:   Days:   Peripheral IV 02/03/19 Left Wrist   02/03/19    1218    Wrist   3   External Urinary Catheter   02/03/19    2150    -   3          Intake/Output Last 24 hours No intake or output data in the 24 hours ending 02/06/19 0105  Labs/Imaging Results for orders placed or performed during the hospital encounter of 02/05/19 (from the past 48 hour(s))  Comprehensive metabolic  panel     Status: Abnormal   Collection Time: 02/05/19  5:23 PM  Result Value Ref Range   Sodium 136 135 - 145 mmol/L   Potassium 4.1 3.5 - 5.1 mmol/L   Chloride 103 98 - 111 mmol/L   CO2 25 22 - 32 mmol/L   Glucose, Bld 106 (H) 70 - 99 mg/dL   BUN 35 (H) 8 - 23 mg/dL   Creatinine, Ser 1.03 0.61 - 1.24 mg/dL   Calcium 9.3 8.9 - 10.3 mg/dL   Total Protein 7.0 6.5 - 8.1 g/dL   Albumin 3.9 3.5 - 5.0 g/dL   AST 17 15 - 41 U/L   ALT 7 0 - 44 U/L   Alkaline Phosphatase 49 38 - 126 U/L   Total Bilirubin 1.4 (H) 0.3 - 1.2 mg/dL   GFR calc non Af Amer >60 >60 mL/min   GFR calc Af Amer >60 >60 mL/min   Anion gap 8 5 - 15    Comment: Performed at [facility], [address] [place], [place] [address]  CBC     Status: None   Collection Time: 02/05/19  5:23 PM  Result Value Ref Range   WBC 7.0 4.0 - 10.5 K/uL   RBC 4.52 4.22 - 5.81 MIL/uL   Hemoglobin 13.9 13.0 - 17.0 g/dL   HCT 42.6 39.0 - 52.0 %   MCV 94.2 80.0 - 100.0 fL  MCH 30.8 26.0 - 34.0 pg   MCHC 32.6 30.0 - 36.0 g/dL   RDW 13.5 11.5 - 15.5 %   Platelets 168 150 - 400 K/uL   nRBC 0.0 0.0 - 0.2 %    Comment: Performed at [facility], [address] [place], [place] [address]   Ct Head Wo Contrast  Result Date: 02/05/2019 CLINICAL DATA:  Confusion EXAM: CT HEAD WITHOUT CONTRAST TECHNIQUE: Contiguous axial images were obtained from the base of the skull through the vertex without intravenous contrast. COMPARISON:  02/03/2019 FINDINGS: Brain: No evidence of acute infarction, hemorrhage, hydrocephalus, extra-axial collection or mass lesion/mass effect. Periventricular white matter hypodensity. Vascular: No hyperdense vessel or unexpected calcification. Skull: Normal. Negative for fracture or focal lesion. Sinuses/Orbits: No acute finding. Other: None. IMPRESSION: No acute intracranial pathology. Advanced small-vessel white matter disease. Electronically Signed   By: Eddie Candle M.D.   On: 02/05/2019 18:17   US  Carotid Bilateral (at [facility] Only)  Result Date: 02/04/2019 CLINICAL DATA:  Stroke. EXAM: BILATERAL CAROTID DUPLEX ULTRASOUND TECHNIQUE: Pearline Cables scale imaging, color Doppler and duplex ultrasound were performed of bilateral carotid and vertebral arteries in the neck. COMPARISON:  None. FINDINGS: Criteria: Quantification of carotid stenosis is based on velocity parameters that correlate the residual internal carotid diameter with NASCET-based stenosis levels, using the diameter of the distal internal carotid lumen as the denominator for stenosis measurement. The following velocity measurements were obtained: RIGHT ICA: 78/8 cm/sec CCA: [id number] cm/sec SYSTOLIC ICA/CCA RATIO:  0.9 ECA: 71 cm/sec LEFT ICA: 105/17 cm/sec CCA: [id number] cm/sec SYSTOLIC ICA/CCA RATIO:  1 ECA: 73 cm/sec RIGHT CAROTID ARTERY: Mild intimal thickening in the common carotid artery and bulb. No significant plaque accumulation or stenosis. Normal waveforms and color Doppler signal. RIGHT VERTEBRAL ARTERY:  Normal flow direction and waveform. LEFT CAROTID ARTERY: No significant plaque or stenosis. Normal waveforms and color Doppler signal. ICA mildly tortuous. LEFT VERTEBRAL ARTERY:  Normal flow direction and waveform. IMPRESSION: 1. No significant carotid plaque or stenosis. 2.  Antegrade bilateral vertebral arterial flow. Electronically Signed   By: Lucrezia Europe M.D.   On: 02/04/2019 10:12    Pending Labs Unresulted Labs (From admission, onward)    Start     Ordered   02/05/19 1814  Urinalysis, Routine w reflex microscopic  Once,   STAT     02/05/19 1813   Signed and Held  CBC  (enoxaparin (LOVENOX)    CrCl >/= 30 ml/min)  Once,   R    Comments: Baseline for enoxaparin therapy IF NOT ALREADY DRAWN.  Notify MD if PLT < 100 K.    Signed and Held   Signed and Held  Creatinine, serum  (enoxaparin (LOVENOX)    CrCl >/= 30 ml/min)  Once,   R    Comments: Baseline for enoxaparin therapy IF NOT ALREADY DRAWN.    Signed and Held   Signed and  Held  Creatinine, serum  (enoxaparin (LOVENOX)    CrCl >/= 30 ml/min)  Weekly,   R    Comments: while on enoxaparin therapy    Signed and Held   Signed and Held  Basic metabolic panel  Tomorrow morning,   R     Signed and Held   Signed and Held  CBC  Tomorrow morning,   R     Signed and Held   Signed and Held  Hemoglobin A1c  Tomorrow morning,   R     Signed and Held   Signed  and Held  Lipid panel  Tomorrow morning,   R    Comments: Fasting    Signed and Held          Vitals/Pain Today's Vitals   02/05/19 2230 02/05/19 2356 02/06/19 0030 02/06/19 0101  BP: (!) 152/71 (!) 150/55 (!) 146/64   Pulse: 62 61 (!) 57   Resp:  14 16   Temp:      TempSrc:      SpO2: 97% 98% 97%   Weight:      Height:      PainSc:   Asleep Asleep    Isolation Precautions No active isolations  Medications Medications - No data to display  Mobility walks with device and uses wheelchair High fall risk   Focused Assessments Neuro Assessment Handoff:  Swallow screen pass? No          Neuro Assessment:   Neuro Checks:      Last Documented NIHSS Modified Score:   Has TPA been given? No If patient is a Neuro Trauma and patient is going to OR before floor call report to [place] [facility] nurse: [phone] or [phone]     R Recommendations: See Admitting Provider Note  Report given to:   Additional Notes:  Daughter at bedside

## 2019-02-06 NOTE — Progress Notes (Signed)
[facility] NAME: Gavin Garcia    MR#:  [id number]  DATE OF BIRTH:  01-Dec-1927  SUBJECTIVE:  CHIEF COMPLAINT:   Chief Complaint  Patient presents with  . Altered Mental Status    REVIEW OF SYSTEMS:  Review of Systems  Constitutional: Negative for chills, fever and malaise/fatigue.  HENT: Negative for congestion, ear discharge, hearing loss and nosebleeds.   Eyes: Negative for blurred vision and double vision.  Respiratory: Negative for cough, shortness of breath and wheezing.   Cardiovascular: Negative for chest pain, palpitations and leg swelling.  Gastrointestinal: Negative for abdominal pain, constipation, diarrhea, nausea and vomiting.  Genitourinary: Negative for dysuria.  Musculoskeletal: Negative for myalgias.  Neurological: Negative for dizziness, focal weakness, seizures, weakness and headaches.  Psychiatric/Behavioral: Negative for depression.    DRUG ALLERGIES:   Allergies  Allergen Reactions  . Codeine Nausea And Vomiting    VITALS:  Blood pressure 131/74, pulse (!) 58, temperature 98.4 F (36.9 C), temperature source Oral, resp. rate 14, height 5\' 8"  (1.727 m), weight 68.9 kg, SpO2 98 %.  PHYSICAL EXAMINATION:  Physical Exam   GENERAL:  85 y.o.-year-old patient lying in the bed with no acute distress.  EYES: Pupils equal, round, reactive to light and accommodation. No scleral icterus. Extraocular muscles intact.  HEENT: Head atraumatic, normocephalic. Oropharynx and nasopharynx clear.  NECK:  Supple, no jugular venous distention. No thyroid enlargement, no tenderness.  LUNGS: Normal breath sounds bilaterally, no wheezing, rales,rhonchi or crepitation. No use of accessory muscles of respiration.  CARDIOVASCULAR: S1, S2 normal. No murmurs, rubs, or gallops.  ABDOMEN: Soft, nontender, nondistended. Bowel sounds present. No organomegaly or mass.  EXTREMITIES: No pedal edema, cyanosis, or clubbing.   NEUROLOGIC: Cranial nerves II through XII are intact. Muscle strength 5/5 in all extremities. Sensation intact. Gait not checked.  PSYCHIATRIC: The patient is alert and oriented x 3.  SKIN: No obvious rash, lesion, or ulcer.    LABORATORY PANEL:   CBC Recent Labs  Lab 02/06/19 0614  WBC 7.3  HGB 13.2  HCT 39.1  PLT 141*   ------------------------------------------------------------------------------------------------------------------  Chemistries  Recent Labs  Lab 02/05/19 1723 02/06/19 0614  NA 136 139  K 4.1 3.6  CL 103 106  CO2 25 25  GLUCOSE 106* 97  BUN 35* 27*  CREATININE 1.03 0.78  CALCIUM 9.3 9.1  AST 17  --   ALT 7  --   ALKPHOS 49  --   BILITOT 1.4*  --    ------------------------------------------------------------------------------------------------------------------  Cardiac Enzymes No results for input(s): TROPONINI in the last 168 hours. ------------------------------------------------------------------------------------------------------------------  RADIOLOGY:  Ct Head Wo Contrast  Result Date: 02/05/2019 CLINICAL DATA:  Confusion EXAM: CT HEAD WITHOUT CONTRAST TECHNIQUE: Contiguous axial images were obtained from the base of the skull through the vertex without intravenous contrast. COMPARISON:  02/03/2019 FINDINGS: Brain: No evidence of acute infarction, hemorrhage, hydrocephalus, extra-axial collection or mass lesion/mass effect. Periventricular white matter hypodensity. Vascular: No hyperdense vessel or unexpected calcification. Skull: Normal. Negative for fracture or focal lesion. Sinuses/Orbits: No acute finding. Other: None. IMPRESSION: No acute intracranial pathology. Advanced small-vessel white matter disease. Electronically Signed   By: Eddie Candle M.D.   On: 02/05/2019 18:17    EKG:   Orders placed or performed during the hospital encounter of 02/05/19  . ED EKG  . ED EKG  . EKG 12-Lead  . EKG 12-Lead  . EKG 12-Lead  . EKG 12-Lead  ASSESSMENT AND PLAN:   85 year old male with past medical history significant for Parkinson's disease, osteoporosis, BPH presents to hospital within 48 hours of her previous admission secondary to transient expressive aphasia.  1.  Recurrent transient expressive aphasia-second episode within the last week. -Stroke work-up -2 days ago, MRI negative for acute infarct- chronic ischemic symptoms,  -carotid Dopplers with no hemodynamic symptoms - ECHO with normal EF, no cardiac source of emboli seen - neurology consulted - restart Parkinson medications. -If concerns, consider EEG in a.m.  2.  Parkinson's disease-continue Sinemet  3.  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  Occasional hallucinations noted in the past.  Follows with outpatient neurology.  4.  Anxiety-on BuSpar  5.  DVT prophylaxis-Lovenox  Return back to [facility] independent living for now. Daughter updated at bedside     All the records are reviewed and case discussed with Care Management/Social Workerr. Management plans discussed with the patient, family and they are in agreement.  CODE STATUS:  Full Code  TOTAL TIME TAKING CARE OF THIS PATIENT: 38 minutes.   POSSIBLE D/C IN 2  DAYS, DEPENDING ON CLINICAL CONDITION.   Gladstone Lighter M.D on 02/06/2019 at 12:21 PM  Between 7am to 6pm - Pager - [phone]  After 6pm go to [email or web address] - password [email or web address] [facility]  Office  [phone]  CC: Primary care physician; Derinda Late, MD

## 2019-02-06 NOTE — ED Notes (Signed)
Noel RN, aware of bed assigned  

## 2019-02-06 NOTE — Care Management Obs Status (Signed)
MEDICARE OBSERVATION STATUS NOTIFICATION   Patient Details  Name: JEFFY KORCH MRN: [id number] Date of Birth: 1927-07-02   Medicare Observation Status Notification Given:  Yes    Julieanna Geraci A Amparo Donalson, RN 02/06/2019, 8:41 AM

## 2019-02-07 ENCOUNTER — Other Ambulatory Visit: Payer: Medicare HMO

## 2019-02-07 DIAGNOSIS — R4701 Aphasia: Secondary | ICD-10-CM

## 2019-02-07 MED ORDER — AMLODIPINE BESYLATE 5 MG PO TABS: 5.0000 mg | ORAL_TABLET | Freq: Every day | ORAL | Status: DC

## 2019-02-07 MED ORDER — AMLODIPINE BESYLATE 2.5 MG PO TABS: 2.5000 mg | ORAL_TABLET | Freq: Every day | ORAL | 2 refills | Status: DC

## 2019-02-07 NOTE — Progress Notes (Signed)
Pt d/c to home via facility van. IV removed intact. VSS. Education completed. All belongings sent with pt.

## 2019-02-07 NOTE — Progress Notes (Signed)
[facility] NAME: Gavin Garcia    MR#:  [id number]  DATE OF BIRTH:  1927-10-13  SUBJECTIVE:  CHIEF COMPLAINT:   Chief Complaint  Patient presents with   Altered Mental Status   - no complaints, no further slurred speech episodes - for EEG today  REVIEW OF SYSTEMS:  Review of Systems  Constitutional: Negative for chills, fever and malaise/fatigue.  HENT: Negative for congestion, ear discharge, hearing loss and nosebleeds.   Eyes: Negative for blurred vision and double vision.  Respiratory: Negative for cough, shortness of breath and wheezing.   Cardiovascular: Negative for chest pain, palpitations and leg swelling.  Gastrointestinal: Negative for abdominal pain, constipation, diarrhea, nausea and vomiting.  Genitourinary: Negative for dysuria.  Musculoskeletal: Negative for myalgias.  Neurological: Negative for dizziness, focal weakness, seizures, weakness and headaches.  Psychiatric/Behavioral: Negative for depression.    DRUG ALLERGIES:   Allergies  Allergen Reactions   Codeine Nausea And Vomiting    VITALS:  Blood pressure (!) 179/64, pulse (!) 51, temperature 97.7 F (36.5 C), temperature source Oral, resp. rate 16, height 5\' 8"  (1.727 m), weight 68.9 kg, SpO2 100 %.  PHYSICAL EXAMINATION:  Physical Exam   GENERAL:  85 y.o.-year-old patient lying in the bed with no acute distress.  EYES: Pupils equal, round, reactive to light and accommodation. No scleral icterus. Extraocular muscles intact.  HEENT: Head atraumatic, normocephalic. Oropharynx and nasopharynx clear.  NECK:  Supple, no jugular venous distention. No thyroid enlargement, no tenderness.  LUNGS: Normal breath sounds bilaterally, no wheezing, rales,rhonchi or crepitation. No use of accessory muscles of respiration.  CARDIOVASCULAR: S1, S2 normal. No murmurs, rubs, or gallops.  ABDOMEN: Soft, nontender, nondistended. Bowel sounds present. No  organomegaly or mass.  EXTREMITIES: No pedal edema, cyanosis, or clubbing.  NEUROLOGIC: Cranial nerves II through XII are intact. Muscle strength 5/5 in all extremities. Sensation intact. Gait not checked.  PSYCHIATRIC: The patient is alert and oriented x 3.  SKIN: No obvious rash, lesion, or ulcer.    LABORATORY PANEL:   CBC Recent Labs  Lab 02/06/19 0614  WBC 7.3  HGB 13.2  HCT 39.1  PLT 141*   ------------------------------------------------------------------------------------------------------------------  Chemistries  Recent Labs  Lab 02/05/19 1723 02/06/19 0614  NA 136 139  K 4.1 3.6  CL 103 106  CO2 25 25  GLUCOSE 106* 97  BUN 35* 27*  CREATININE 1.03 0.78  CALCIUM 9.3 9.1  AST 17  --   ALT 7  --   ALKPHOS 49  --   BILITOT 1.4*  --    ------------------------------------------------------------------------------------------------------------------  Cardiac Enzymes No results for input(s): TROPONINI in the last 168 hours. ------------------------------------------------------------------------------------------------------------------  RADIOLOGY:  Ct Angio Head W Or Wo Contrast  Result Date: 02/06/2019 CLINICAL DATA:  Stroke mild follow-up. Patient is amnestic to the last 2 days. Recent stroke workup 3 days ago was negative for recent infarcts. Episode of expressive aphasia within the last week. EXAM: CT ANGIOGRAPHY HEAD AND NECK TECHNIQUE: Multidetector CT imaging of the head and neck was performed using the standard protocol during bolus administration of intravenous contrast. Multiplanar CT image reconstructions and MIPs were obtained to evaluate the vascular anatomy. Carotid stenosis measurements (when applicable) are obtained utilizing NASCET criteria, using the distal internal carotid diameter as the denominator. CONTRAST:  8mL OMNIPAQUE IOHEXOL 350 MG/ML SOLN COMPARISON:  CT head without contrast 02/05/2019. MRI head 02/03/2019 FINDINGS: CTA NECK FINDINGS  Aortic arch: Atherosclerotic calcifications are present at  the aortic arch. Is no significant stenosis of the great vessel origins. No significant aneurysm is present. Right carotid system: The right common carotid artery is within normal limits. Atherosclerotic calcifications are present at the proximal right ICA without significant stenosis. There is some tortuosity of the cervical right ICA without significant stenosis. Left carotid system: The left common carotid artery is within normal limits. Bifurcation is unremarkable. There is mild tortuosity of the cervical left ICA without significant stenosis. Focal beaded irregularity is present in the mid cervical left ICA, suggesting fibromuscular dysplasia. Vertebral arteries: The vertebral arteries are codominant. Both vertebral arteries originate from the subclavian arteries. There is no significant stenosis. Skeleton: There is straightening of the normal cervical lordosis. Multilevel endplate degenerative changes are present. There is slight anterolisthesis at C2-3. Uncovertebral and facet disease contribute to foraminal narrowing, greatest on the right at C3-4 and C4-5. Other neck: The soft tissues of the neck are otherwise unremarkable. Calcification in the right palatine tonsil is consistent with remote infection. No focal mucosal or submucosal lesions are present. Vocal cords are midline and symmetric. Thyroid is normal. No significant cervical adenopathy is present. The salivary glands are within normal limits. Upper chest: The lung apices are clear. Review of the MIP images confirms the above findings CTA HEAD FINDINGS Anterior circulation: The internal carotid arteries are within normal limits from the skull base through the ICA termini bilaterally. The ICA termini are within normal limits bilaterally. The right A1 segment is dominant. Anterior communicating artery is patent. M1 segments are normal bilaterally. MCA bifurcations are intact. No significant  proximal stenosis, occlusion, or aneurysm is present. Posterior circulation: The left vertebral artery is dominant. PICA origins are visualized and normal. The vertebrobasilar junction is normal. The basilar artery terminates at the PCA vessels bilaterally. PCA branch vessels are unremarkable. Venous sinuses: Dural sinuses are patent. Straight sinus and deep cerebral veins are intact. Cortical veins are within limits. Anatomic variants: None Review of the MIP images confirms the above findings IMPRESSION: 1. Tortuosity of the cervical internal carotid arteries bilaterally without significant stenosis. This is nonspecific, but commonly seen in the setting of chronic hypertension. 2. Focal vessel irregularity on left cervical ICA raises the possibility fibromuscular dysplasia. 3. No significant posterior circulation disease. 4. Multilevel degenerative changes in the cervical spine. Uncovertebral and facet disease contribute to right foraminal narrowing, greatest at C3-4 and C4-5. Electronically Signed   By: San Morelle M.D.   On: 02/06/2019 14:17   Ct Head Wo Contrast  Result Date: 02/05/2019 CLINICAL DATA:  Confusion EXAM: CT HEAD WITHOUT CONTRAST TECHNIQUE: Contiguous axial images were obtained from the base of the skull through the vertex without intravenous contrast. COMPARISON:  02/03/2019 FINDINGS: Brain: No evidence of acute infarction, hemorrhage, hydrocephalus, extra-axial collection or mass lesion/mass effect. Periventricular white matter hypodensity. Vascular: No hyperdense vessel or unexpected calcification. Skull: Normal. Negative for fracture or focal lesion. Sinuses/Orbits: No acute finding. Other: None. IMPRESSION: No acute intracranial pathology. Advanced small-vessel white matter disease. Electronically Signed   By: Eddie Candle M.D.   On: 02/05/2019 18:17   Ct Angio Neck W Or Wo Contrast  Result Date: 02/06/2019 CLINICAL DATA:  Stroke mild follow-up. Patient is amnestic to the last  2 days. Recent stroke workup 3 days ago was negative for recent infarcts. Episode of expressive aphasia within the last week. EXAM: CT ANGIOGRAPHY HEAD AND NECK TECHNIQUE: Multidetector CT imaging of the head and neck was performed using the standard protocol during bolus administration of intravenous  contrast. Multiplanar CT image reconstructions and MIPs were obtained to evaluate the vascular anatomy. Carotid stenosis measurements (when applicable) are obtained utilizing NASCET criteria, using the distal internal carotid diameter as the denominator. CONTRAST:  45mL OMNIPAQUE IOHEXOL 350 MG/ML SOLN COMPARISON:  CT head without contrast 02/05/2019. MRI head 02/03/2019 FINDINGS: CTA NECK FINDINGS Aortic arch: Atherosclerotic calcifications are present at the aortic arch. Is no significant stenosis of the great vessel origins. No significant aneurysm is present. Right carotid system: The right common carotid artery is within normal limits. Atherosclerotic calcifications are present at the proximal right ICA without significant stenosis. There is some tortuosity of the cervical right ICA without significant stenosis. Left carotid system: The left common carotid artery is within normal limits. Bifurcation is unremarkable. There is mild tortuosity of the cervical left ICA without significant stenosis. Focal beaded irregularity is present in the mid cervical left ICA, suggesting fibromuscular dysplasia. Vertebral arteries: The vertebral arteries are codominant. Both vertebral arteries originate from the subclavian arteries. There is no significant stenosis. Skeleton: There is straightening of the normal cervical lordosis. Multilevel endplate degenerative changes are present. There is slight anterolisthesis at C2-3. Uncovertebral and facet disease contribute to foraminal narrowing, greatest on the right at C3-4 and C4-5. Other neck: The soft tissues of the neck are otherwise unremarkable. Calcification in the right  palatine tonsil is consistent with remote infection. No focal mucosal or submucosal lesions are present. Vocal cords are midline and symmetric. Thyroid is normal. No significant cervical adenopathy is present. The salivary glands are within normal limits. Upper chest: The lung apices are clear. Review of the MIP images confirms the above findings CTA HEAD FINDINGS Anterior circulation: The internal carotid arteries are within normal limits from the skull base through the ICA termini bilaterally. The ICA termini are within normal limits bilaterally. The right A1 segment is dominant. Anterior communicating artery is patent. M1 segments are normal bilaterally. MCA bifurcations are intact. No significant proximal stenosis, occlusion, or aneurysm is present. Posterior circulation: The left vertebral artery is dominant. PICA origins are visualized and normal. The vertebrobasilar junction is normal. The basilar artery terminates at the PCA vessels bilaterally. PCA branch vessels are unremarkable. Venous sinuses: Dural sinuses are patent. Straight sinus and deep cerebral veins are intact. Cortical veins are within limits. Anatomic variants: None Review of the MIP images confirms the above findings IMPRESSION: 1. Tortuosity of the cervical internal carotid arteries bilaterally without significant stenosis. This is nonspecific, but commonly seen in the setting of chronic hypertension. 2. Focal vessel irregularity on left cervical ICA raises the possibility fibromuscular dysplasia. 3. No significant posterior circulation disease. 4. Multilevel degenerative changes in the cervical spine. Uncovertebral and facet disease contribute to right foraminal narrowing, greatest at C3-4 and C4-5. Electronically Signed   By: San Morelle M.D.   On: 02/06/2019 14:17    EKG:   Orders placed or performed during the hospital encounter of 02/05/19   ED EKG   ED EKG   EKG 12-Lead   EKG 12-Lead   EKG 12-Lead   EKG 12-Lead     ASSESSMENT AND PLAN:   85 year old male with past medical history significant for Parkinson's disease, osteoporosis, BPH presents to hospital within 48 hours of her previous admission secondary to transient expressive aphasia.  1.  Recurrent transient expressive aphasia-second episode within the last week. - likely TIA -Stroke work-up -2 days ago, MRI negative for acute infarct- chronic ischemic symptoms,  -carotid Dopplers with no hemodynamic symptoms - ECHO with normal  EF, no cardiac source of emboli seen - neurology consult appreciated - CT angio with no significant occlusion, - restart Parkinson medications. - EEG today- if negative- discharge today.  2.  Parkinson's disease-continue Sinemet  3.  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  Occasional hallucinations noted in the past.  Follows with outpatient neurology.  4.  Anxiety-on BuSpar  5.  DVT prophylaxis-Lovenox  6.  Hypertension-added Norvasc  Return back to [facility] independent living for now. Daughter updated at bedside     All the records are reviewed and case discussed with Care Management/Social Workerr. Management plans discussed with the patient, family and they are in agreement.  CODE STATUS:  Full Code  TOTAL TIME TAKING CARE OF THIS PATIENT: 38 minutes.   POSSIBLE D/C TODAY, DEPENDING ON CLINICAL CONDITION.   Gladstone Lighter M.D on 02/07/2019 at 10:12 AM  Between 7am to 6pm - Pager - [phone]  After 6pm go to [email or web address] - password [email or web address] [facility]  Office  [phone]  CC: Primary care physician; Derinda Late, MD

## 2019-02-07 NOTE — Progress Notes (Addendum)
SLP Cancellation Note  Patient Details Name: Gavin Garcia MRN: [id number] DOB: 1927-08-27   Cancelled treatment:       Reason Eval/Treat Not Completed: SLP screened, no needs identified, will sign off(chart reviewed; consulted NSG then met w/ pt/Dtr). Pt denied any difficulty swallowing and is currently on a regular diet; Daughter agreed. He has been swallowing Pills w/ water w/ NSG w/out difficulty reported but discussed the option of the Applesauce trick - using applesauce to swallow Pills w/ for cohesiveness and safety. Daughter agreed. Pt conversed in conversational w/ No gross deficits noted; pt does have a baseline of Parkinson's Dis. which appears to impact his articulation of speech, minimally w/ certain speech sounds. Pt often looked toward his Daughter for clarification of events/timeframe. Pt and family denied any new speech or language deficits. He is answering all questions and following instructions appropriately.  No further skilled ST services indicated as pt appears at his baseline. Encouraged pt and family to f/u w/ Neurology at next appt if any concerns are noted in changes from his speech-language baseline post d/c to home environment. Encouraged pt and Daughter the importance of maintaining a schedule w/ all ADLs in light of the Parkinson's Dis. Pt/Dtr agreed. NSG to reconsult if any change in status while admitted.    [name], MS, CCC-SLP [name],[name] 02/07/2019, 9:58 AM

## 2019-02-07 NOTE — Progress Notes (Signed)
eeg completed ° °

## 2019-02-07 NOTE — Procedures (Signed)
History: 85 yo M with transient episodes of aphasia  Sedation: none  Technique: This is a 21 channel routine scalp EEG performed at the bedside with bipolar and monopolar montages arranged in accordance to the international 10/20 system of electrode placement. One channel was dedicated to EKG recording.    Background: The background consists of intermixed alpha and beta activities. There is a well defined posterior dominant rhythm of 7-8 Hz that attenuates with eye opening. There is an increase in delta associated with drowsiness.  Sleep is recorded with normal appearing structures.   Photic stimulation: Physiologic driving is Not performed  EEG Abnormalities: 1) Slow PDR  Clinical Interpretation: This borderline EEG can be indicative of a mild generalized non-specific cerebral dysfunction(encephalopathy), but in the elderly slowing of the PDR can be seen in normal patients as well.   There was no seizure or seizure predisposition recorded on this study. Please note that lack of epileptiform activity on EEG does not preclude the possibility of epilepsy.   Gavin Rack, MD [facility] [phone]  If 7pm- 7am, please page neurology on call as listed in [place].

## 2019-02-07 NOTE — TOC Transition Note (Signed)
Transition of Care [facility]) - CM/SW Discharge Note   Patient Details  Name: Gavin Garcia MRN: [id number] Date of Birth: November 21, 1927  [facility] CM/SW Contact:  Melany Wiesman, Lenice Llamas Phone Number: [phone]  02/07/2019, 4:15 PM   Clinical Narrative: Clinical Social Worker ([place]) met with patient and his daughter [name] [phone] was at bedside. CSW introduced self and explained role of CSW department. Per daughter patient lives at [facility] independent [facility] and his wife is in the memory care unit at [facility]. Per daughter they have been working on getting patient into SNF at [facility] for long term care. CSW explained to daughter that [facility] will likely not pay for SNF for short term rehab if he is close to his baseline. CSW explained to daughter private pay and medicaid options for long term care SNF. Per daughter patient has 24/7 personal caregivers through [place]. Per daughter patient will D/C back to [facility] independent living and requested that the facility provide transport home today. Per April admissions coordinator at [facility] they can provide transport and pick up patient at 3 pm today. Per April [facility] will continue to provide PT and OT services to patient at independent living. CSW made April aware that patient's last covid test was negative on 8/20. RN aware of above. Please reconsult if future social work needs arise. CSW signing off.     Final next level of care: [place] Barriers to Discharge: Barriers Resolved   Patient Goals and CMS Choice Patient states their goals for this hospitalization and ongoing recovery are:: To go home.      Discharge Placement                       Discharge Plan and Services                DME Arranged: N/A                    Social Determinants of Health (SDOH) Interventions     Readmission Risk Interventions No  flowsheet data found.

## 2019-02-08 NOTE — Discharge Summary (Signed)
[facility] [place] NAME: Gavin Garcia    MR#:  [id number]  DATE OF BIRTH:  08/16/1927  DATE OF ADMISSION:  02/05/2019   ADMITTING PHYSICIAN: Lance Coon, MD  DATE OF DISCHARGE: 02/07/2019  3:12 PM  PRIMARY CARE PHYSICIAN: Derinda Late, MD   ADMISSION DIAGNOSIS:   Cerebrovascular accident (CVA), unspecified mechanism ([place]) [I63.9]  DISCHARGE DIAGNOSIS:   Principal Problem:   Aphasia Active Problems:   Parkinson disease ([place])   Glaucoma   Pressure injury of skin   SECONDARY DIAGNOSIS:   Past Medical History:  Diagnosis Date  . Basal cell carcinoma   . Glaucoma (increased eye pressure)   . Osteoporosis   . Parkinson's disease ([place])   . Prostate enlargement     HOSPITAL COURSE:   85 year old male with past medical history significant for Parkinson's disease, osteoporosis, BPH presents to hospital within 48 hours of her previous admission secondary to transient expressive aphasia.  1.  Recurrent transient expressive aphasia-second episode within the last week.  Unsure if this is TIA or hypoperfusion from blood pressure changes.  Concern for dysautonomia from Parkinson's disease.  Need to be monitored as outpatient closely. - likely TIA -Stroke work-up -2 days ago, MRI negative for acute infarct- chronic ischemic symptoms,  -carotid Dopplers with no hemodynamic symptoms - ECHO with normal EF, no cardiac source of emboli seen - neurology consult appreciated - CT angio with no significant occlusion, - restarted Parkinson medications. - EEG done showed generalized slowing indicative of metabolic encephalopathy or age.  No epileptic activity noted..  2.  Parkinson's disease-continue Sinemet -Outpatient follow-up recommended  3.  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  Occasional hallucinations noted in the past.   Follows with outpatient neurology. -Seems to be at baseline  4.  Anxiety-on BuSpar  5.  Hypertension-added Norvasc  Return back to  independent living for now. Daughter updated at bedside  DISCHARGE CONDITIONS:   Guarded  CONSULTS OBTAINED:   Treatment Team:  Leotis Pain, MD  DRUG ALLERGIES:   Allergies  Allergen Reactions  . Codeine Nausea And Vomiting   DISCHARGE MEDICATIONS:   Allergies as of 02/07/2019      Reactions   Codeine Nausea And Vomiting      Medication List    TAKE these medications   acetaminophen 325 MG tablet Commonly known as: TYLENOL Take 2 tablets (650 mg total) by mouth every 6 (six) hours as needed for mild pain (or Fever >/= 101).   amLODipine 2.5 MG tablet Commonly known as: NORVASC Take 1 tablet (2.5 mg total) by mouth daily. Please hold if systolic BP [id number]   aspirin 81 MG EC tablet Take 1 tablet (81 mg total) by mouth daily.   busPIRone 7.5 MG tablet Commonly known as: BUSPAR Take 7.5 mg by mouth 2 (two) times daily.   carbidopa-levodopa 25-250 MG tablet Commonly known as: SINEMET IR Take 1 tablet by mouth 3 (three) times daily.   Dermacloud Crea Apply 1 application topically daily as needed (skin irritation/breakdown).   dorzolamide-timolol 22.3-6.8 MG/ML ophthalmic solution Commonly known as: COSOPT Place 1 drop into the left eye 2 (two) times daily.   finasteride 5 MG tablet Commonly known as: PROSCAR Take 1 tablet (5 mg total) by mouth daily.   furosemide 20 MG tablet Commonly known as: LASIX Take 1 tablet (20 mg total) by mouth daily as needed  for edema. What changed:   how much to take  when to take this   latanoprost 0.005 % ophthalmic solution Commonly known as: XALATAN Place 1 drop into both eyes at bedtime.   mupirocin ointment 2 % Commonly known as: BACTROBAN Place 1 application into the nose 2 (two) times daily as needed (leg wounds).   polyethylene glycol powder 17 GM/SCOOP powder Commonly  known as: GLYCOLAX/MIRALAX Take 17 g by mouth daily.        DISCHARGE INSTRUCTIONS:   1.  PCP follow-up in 1 to 2 weeks 2.  Neurology follow-up within 2 weeks  DIET:   Cardiac diet  ACTIVITY:   Activity as tolerated  OXYGEN:   Home Oxygen: No.  Oxygen Delivery: room air  DISCHARGE LOCATION:   home-patient is from independent living at the [facility]  If you experience worsening of your admission symptoms, develop shortness of breath, life threatening emergency, suicidal or homicidal thoughts you must seek medical attention immediately by calling 911 or calling your MD immediately  if symptoms less severe.  You Must read complete instructions/literature along with all the possible adverse reactions/side effects for all the Medicines you take and that have been prescribed to you. Take any new Medicines after you have completely understood and accpet all the possible adverse reactions/side effects.   Please note  You were cared for by a hospitalist during your hospital stay. If you have any questions about your discharge medications or the care you received while you were in the hospital after you are discharged, you can call the unit and asked to speak with the hospitalist on call if the hospitalist that took care of you is not available. Once you are discharged, your primary care physician will handle any further medical issues. Please note that NO REFILLS for any discharge medications will be authorized once you are discharged, as it is imperative that you return to your primary care physician (or establish a relationship with a primary care physician if you do not have one) for your aftercare needs so that they can reassess your need for medications and monitor your lab values.    On the day of Discharge:  VITAL SIGNS:   Blood pressure (!) 179/64, pulse (!) 51, temperature 97.7 F (36.5 C), temperature source Oral, resp. rate 16, height 5\' 8"  (1.727 m), weight  68.9 kg, SpO2 100 %.  PHYSICAL EXAMINATION:    GENERAL:  85 y.o.-year-old patient lying in the bed with no acute distress.  EYES: Pupils equal, round, reactive to light and accommodation. No scleral icterus. Extraocular muscles intact.  HEENT: Head atraumatic, normocephalic. Oropharynx and nasopharynx clear.  NECK:  Supple, no jugular venous distention. No thyroid enlargement, no tenderness.  LUNGS: Normal breath sounds bilaterally, no wheezing, rales,rhonchi or crepitation. No use of accessory muscles of respiration.  Decreased bibasilar breath sounds CARDIOVASCULAR: S1, S2 normal. No  rubs, or gallops.  2/6 systolic murmur is present ABDOMEN: Soft, nontender, nondistended. Bowel sounds present. No organomegaly or mass.  EXTREMITIES: No pedal edema, cyanosis, or clubbing.  NEUROLOGIC: Cranial nerves II through XII are intact. Muscle strength 5/5 in all extremities. Sensation intact. Gait not checked.  Speech is clear, no expressive aphasia noted PSYCHIATRIC: The patient is alert and oriented x 3.  SKIN: No obvious rash, lesion, or ulcer.   DATA REVIEW:   CBC Recent Labs  Lab 02/06/19 0614  WBC 7.3  HGB 13.2  HCT 39.1  PLT 141*    Chemistries  Recent  Labs  Lab 02/05/19 1723 02/06/19 0614  NA 136 139  K 4.1 3.6  CL 103 106  CO2 25 25  GLUCOSE 106* 97  BUN 35* 27*  CREATININE 1.03 0.78  CALCIUM 9.3 9.1  AST 17  --   ALT 7  --   ALKPHOS 49  --   BILITOT 1.4*  --      Microbiology Results  Results for orders placed or performed during the hospital encounter of 02/03/19  SARS CORONAVIRUS 2 Nasal Swab Aptima Multi Swab     Status: None   Collection Time: 02/03/19  3:22 PM   Specimen: Aptima Multi Swab; Nasal Swab  Result Value Ref Range Status   SARS Coronavirus 2 NEGATIVE NEGATIVE Final    Comment: (NOTE) SARS-CoV-2 target nucleic acids are NOT DETECTED. The SARS-CoV-2 RNA is generally detectable in upper and lower respiratory specimens during the acute phase of  infection. Negative results do not preclude SARS-CoV-2 infection, do not rule out co-infections with other pathogens, and should not be used as the sole basis for treatment or other patient management decisions. Negative results must be combined with clinical observations, patient history, and epidemiological information. The expected result is Negative. Fact Sheet for Patients: SugarRoll.be Fact Sheet for Healthcare Providers: https://www.woods-mathews.com/ This test is not yet approved or cleared by the [place] FDA and  has been authorized for detection and/or diagnosis of SARS-CoV-2 by FDA under an Emergency Use Authorization (EUA). This EUA will remain  in effect (meaning this test can be used) for the duration of the COVID-19 declaration under Section 56 4(b)(1) of the Act, 21 U.S.C. section 360bbb-3(b)(1), unless the authorization is terminated or revoked sooner. Performed at [facility], [place] [address] [place], [place] [address]     RADIOLOGY:  No results found.   Management plans discussed with the patient, family and they are in agreement.  CODE STATUS:  Code Status History    Date Active Date Inactive Code Status Order ID Comments User Context   02/06/2019 0129 02/07/2019 1844 DNR [id number]  Lance Coon, MD ED   02/03/2019 1728 02/04/2019 1751 DNR [id number]  Sela Hua, MD ED   03/08/2018 0349 03/11/2018 1813 Full Code [id number]  Harrie Foreman, MD Inpatient   Advance Care Planning Activity    Questions for Most Recent Historical Code Status (Order [id number])    Question Answer Comment   In the event of cardiac or respiratory ARREST Do not call a "code blue"    In the event of cardiac or respiratory ARREST Do not perform Intubation, CPR, defibrillation or ACLS    In the event of cardiac or respiratory ARREST Use medication by any route, position, wound care, and other measures to relive pain and suffering.  May use oxygen, suction and manual treatment of airway obstruction as needed for comfort.       TOTAL TIME TAKING CARE OF THIS PATIENT: 38 minutes.    Gladstone Lighter M.D on 02/08/2019 at 2:26 PM  Between 7am to 6pm - Pager - [phone]  After 6pm go to [email or web address] - Proofreader  [facility]  Office  [phone]  CC: Primary care physician; Derinda Late, MD   Note: This dictation was prepared with Dragon dictation along with smaller phrase technology. Any transcriptional errors that result from this process are unintentional.

## 2019-02-15 ENCOUNTER — Encounter
Admission: RE | Admit: 2019-02-15 | Discharge: 2019-02-15 | Disposition: A | Discharge status: Home / Self Care | Payer: Medicare HMO | Source: Ambulatory Visit | Attending: Internal Medicine | Admitting: Internal Medicine

## 2019-02-22 DIAGNOSIS — I779 Disorder of arteries and arterioles, unspecified: Secondary | ICD-10-CM | POA: Insufficient documentation

## 2019-02-22 DIAGNOSIS — Z789 Other specified health status: Secondary | ICD-10-CM | POA: Insufficient documentation

## 2019-02-23 DIAGNOSIS — M6281 Muscle weakness (generalized): Secondary | ICD-10-CM | POA: Diagnosis not present

## 2019-02-23 DIAGNOSIS — I2510 Atherosclerotic heart disease of native coronary artery without angina pectoris: Secondary | ICD-10-CM | POA: Diagnosis not present

## 2019-02-23 DIAGNOSIS — R279 Unspecified lack of coordination: Secondary | ICD-10-CM | POA: Diagnosis not present

## 2019-02-23 DIAGNOSIS — R41841 Cognitive communication deficit: Secondary | ICD-10-CM | POA: Diagnosis not present

## 2019-02-23 DIAGNOSIS — F5101 Primary insomnia: Secondary | ICD-10-CM | POA: Insufficient documentation

## 2019-02-23 DIAGNOSIS — R499 Unspecified voice and resonance disorder: Secondary | ICD-10-CM | POA: Diagnosis not present

## 2019-02-23 DIAGNOSIS — G320 Subacute combined degeneration of spinal cord in diseases classified elsewhere: Secondary | ICD-10-CM | POA: Diagnosis not present

## 2019-02-23 DIAGNOSIS — R2689 Other abnormalities of gait and mobility: Secondary | ICD-10-CM | POA: Diagnosis not present

## 2019-02-23 DIAGNOSIS — M1712 Unilateral primary osteoarthritis, left knee: Secondary | ICD-10-CM | POA: Insufficient documentation

## 2019-02-24 DIAGNOSIS — R2689 Other abnormalities of gait and mobility: Secondary | ICD-10-CM | POA: Diagnosis not present

## 2019-02-24 DIAGNOSIS — G320 Subacute combined degeneration of spinal cord in diseases classified elsewhere: Secondary | ICD-10-CM | POA: Diagnosis not present

## 2019-02-24 DIAGNOSIS — R499 Unspecified voice and resonance disorder: Secondary | ICD-10-CM | POA: Diagnosis not present

## 2019-02-24 DIAGNOSIS — I2510 Atherosclerotic heart disease of native coronary artery without angina pectoris: Secondary | ICD-10-CM | POA: Diagnosis not present

## 2019-02-24 DIAGNOSIS — M6281 Muscle weakness (generalized): Secondary | ICD-10-CM | POA: Diagnosis not present

## 2019-02-24 DIAGNOSIS — R279 Unspecified lack of coordination: Secondary | ICD-10-CM | POA: Diagnosis not present

## 2019-02-24 DIAGNOSIS — R41841 Cognitive communication deficit: Secondary | ICD-10-CM | POA: Diagnosis not present

## 2019-02-25 DIAGNOSIS — R279 Unspecified lack of coordination: Secondary | ICD-10-CM | POA: Diagnosis not present

## 2019-02-25 DIAGNOSIS — G320 Subacute combined degeneration of spinal cord in diseases classified elsewhere: Secondary | ICD-10-CM | POA: Diagnosis not present

## 2019-02-25 DIAGNOSIS — R2689 Other abnormalities of gait and mobility: Secondary | ICD-10-CM | POA: Diagnosis not present

## 2019-02-25 DIAGNOSIS — M6281 Muscle weakness (generalized): Secondary | ICD-10-CM | POA: Diagnosis not present

## 2019-02-25 DIAGNOSIS — R499 Unspecified voice and resonance disorder: Secondary | ICD-10-CM | POA: Diagnosis not present

## 2019-02-25 DIAGNOSIS — R41841 Cognitive communication deficit: Secondary | ICD-10-CM | POA: Diagnosis not present

## 2019-02-25 DIAGNOSIS — I2510 Atherosclerotic heart disease of native coronary artery without angina pectoris: Secondary | ICD-10-CM | POA: Diagnosis not present

## 2019-02-28 DIAGNOSIS — R499 Unspecified voice and resonance disorder: Secondary | ICD-10-CM | POA: Diagnosis not present

## 2019-02-28 DIAGNOSIS — I2510 Atherosclerotic heart disease of native coronary artery without angina pectoris: Secondary | ICD-10-CM | POA: Diagnosis not present

## 2019-02-28 DIAGNOSIS — R279 Unspecified lack of coordination: Secondary | ICD-10-CM | POA: Diagnosis not present

## 2019-02-28 DIAGNOSIS — G320 Subacute combined degeneration of spinal cord in diseases classified elsewhere: Secondary | ICD-10-CM | POA: Diagnosis not present

## 2019-02-28 DIAGNOSIS — R41841 Cognitive communication deficit: Secondary | ICD-10-CM | POA: Diagnosis not present

## 2019-02-28 DIAGNOSIS — R2689 Other abnormalities of gait and mobility: Secondary | ICD-10-CM | POA: Diagnosis not present

## 2019-02-28 DIAGNOSIS — M6281 Muscle weakness (generalized): Secondary | ICD-10-CM | POA: Diagnosis not present

## 2019-03-01 DIAGNOSIS — R2689 Other abnormalities of gait and mobility: Secondary | ICD-10-CM | POA: Diagnosis not present

## 2019-03-01 DIAGNOSIS — G320 Subacute combined degeneration of spinal cord in diseases classified elsewhere: Secondary | ICD-10-CM | POA: Diagnosis not present

## 2019-03-01 DIAGNOSIS — R279 Unspecified lack of coordination: Secondary | ICD-10-CM | POA: Diagnosis not present

## 2019-03-01 DIAGNOSIS — M6281 Muscle weakness (generalized): Secondary | ICD-10-CM | POA: Diagnosis not present

## 2019-03-01 DIAGNOSIS — I2510 Atherosclerotic heart disease of native coronary artery without angina pectoris: Secondary | ICD-10-CM | POA: Diagnosis not present

## 2019-03-01 DIAGNOSIS — R499 Unspecified voice and resonance disorder: Secondary | ICD-10-CM | POA: Diagnosis not present

## 2019-03-01 DIAGNOSIS — R41841 Cognitive communication deficit: Secondary | ICD-10-CM | POA: Diagnosis not present

## 2019-03-02 DIAGNOSIS — R2689 Other abnormalities of gait and mobility: Secondary | ICD-10-CM | POA: Diagnosis not present

## 2019-03-02 DIAGNOSIS — R499 Unspecified voice and resonance disorder: Secondary | ICD-10-CM | POA: Diagnosis not present

## 2019-03-02 DIAGNOSIS — M6281 Muscle weakness (generalized): Secondary | ICD-10-CM | POA: Diagnosis not present

## 2019-03-02 DIAGNOSIS — R279 Unspecified lack of coordination: Secondary | ICD-10-CM | POA: Diagnosis not present

## 2019-03-02 DIAGNOSIS — I2510 Atherosclerotic heart disease of native coronary artery without angina pectoris: Secondary | ICD-10-CM | POA: Diagnosis not present

## 2019-03-02 DIAGNOSIS — R41841 Cognitive communication deficit: Secondary | ICD-10-CM | POA: Diagnosis not present

## 2019-03-02 DIAGNOSIS — G320 Subacute combined degeneration of spinal cord in diseases classified elsewhere: Secondary | ICD-10-CM | POA: Diagnosis not present

## 2019-03-03 DIAGNOSIS — R41841 Cognitive communication deficit: Secondary | ICD-10-CM | POA: Diagnosis not present

## 2019-03-03 DIAGNOSIS — R499 Unspecified voice and resonance disorder: Secondary | ICD-10-CM | POA: Diagnosis not present

## 2019-03-03 DIAGNOSIS — M6281 Muscle weakness (generalized): Secondary | ICD-10-CM | POA: Diagnosis not present

## 2019-03-03 DIAGNOSIS — G320 Subacute combined degeneration of spinal cord in diseases classified elsewhere: Secondary | ICD-10-CM | POA: Diagnosis not present

## 2019-03-03 DIAGNOSIS — I2510 Atherosclerotic heart disease of native coronary artery without angina pectoris: Secondary | ICD-10-CM | POA: Diagnosis not present

## 2019-03-03 DIAGNOSIS — R279 Unspecified lack of coordination: Secondary | ICD-10-CM | POA: Diagnosis not present

## 2019-03-03 DIAGNOSIS — R2689 Other abnormalities of gait and mobility: Secondary | ICD-10-CM | POA: Diagnosis not present

## 2019-03-04 DIAGNOSIS — G320 Subacute combined degeneration of spinal cord in diseases classified elsewhere: Secondary | ICD-10-CM | POA: Diagnosis not present

## 2019-03-04 DIAGNOSIS — I2510 Atherosclerotic heart disease of native coronary artery without angina pectoris: Secondary | ICD-10-CM | POA: Diagnosis not present

## 2019-03-04 DIAGNOSIS — R41841 Cognitive communication deficit: Secondary | ICD-10-CM | POA: Diagnosis not present

## 2019-03-04 DIAGNOSIS — R499 Unspecified voice and resonance disorder: Secondary | ICD-10-CM | POA: Diagnosis not present

## 2019-03-04 DIAGNOSIS — R279 Unspecified lack of coordination: Secondary | ICD-10-CM | POA: Diagnosis not present

## 2019-03-04 DIAGNOSIS — M6281 Muscle weakness (generalized): Secondary | ICD-10-CM | POA: Diagnosis not present

## 2019-03-04 DIAGNOSIS — R2689 Other abnormalities of gait and mobility: Secondary | ICD-10-CM | POA: Diagnosis not present

## 2019-03-07 DIAGNOSIS — R41841 Cognitive communication deficit: Secondary | ICD-10-CM | POA: Diagnosis not present

## 2019-03-07 DIAGNOSIS — G320 Subacute combined degeneration of spinal cord in diseases classified elsewhere: Secondary | ICD-10-CM | POA: Diagnosis not present

## 2019-03-07 DIAGNOSIS — R499 Unspecified voice and resonance disorder: Secondary | ICD-10-CM | POA: Diagnosis not present

## 2019-03-07 DIAGNOSIS — M6281 Muscle weakness (generalized): Secondary | ICD-10-CM | POA: Diagnosis not present

## 2019-03-07 DIAGNOSIS — R279 Unspecified lack of coordination: Secondary | ICD-10-CM | POA: Diagnosis not present

## 2019-03-07 DIAGNOSIS — I2510 Atherosclerotic heart disease of native coronary artery without angina pectoris: Secondary | ICD-10-CM | POA: Diagnosis not present

## 2019-03-07 DIAGNOSIS — R2689 Other abnormalities of gait and mobility: Secondary | ICD-10-CM | POA: Diagnosis not present

## 2019-03-08 DIAGNOSIS — R279 Unspecified lack of coordination: Secondary | ICD-10-CM | POA: Diagnosis not present

## 2019-03-08 DIAGNOSIS — R41841 Cognitive communication deficit: Secondary | ICD-10-CM | POA: Diagnosis not present

## 2019-03-08 DIAGNOSIS — G320 Subacute combined degeneration of spinal cord in diseases classified elsewhere: Secondary | ICD-10-CM | POA: Diagnosis not present

## 2019-03-08 DIAGNOSIS — R499 Unspecified voice and resonance disorder: Secondary | ICD-10-CM | POA: Diagnosis not present

## 2019-03-08 DIAGNOSIS — I2510 Atherosclerotic heart disease of native coronary artery without angina pectoris: Secondary | ICD-10-CM | POA: Diagnosis not present

## 2019-03-08 DIAGNOSIS — R2689 Other abnormalities of gait and mobility: Secondary | ICD-10-CM | POA: Diagnosis not present

## 2019-03-08 DIAGNOSIS — M6281 Muscle weakness (generalized): Secondary | ICD-10-CM | POA: Diagnosis not present

## 2019-03-09 DIAGNOSIS — R2689 Other abnormalities of gait and mobility: Secondary | ICD-10-CM | POA: Diagnosis not present

## 2019-03-09 DIAGNOSIS — M6281 Muscle weakness (generalized): Secondary | ICD-10-CM | POA: Diagnosis not present

## 2019-03-09 DIAGNOSIS — R41841 Cognitive communication deficit: Secondary | ICD-10-CM | POA: Diagnosis not present

## 2019-03-09 DIAGNOSIS — R279 Unspecified lack of coordination: Secondary | ICD-10-CM | POA: Diagnosis not present

## 2019-03-09 DIAGNOSIS — G320 Subacute combined degeneration of spinal cord in diseases classified elsewhere: Secondary | ICD-10-CM | POA: Diagnosis not present

## 2019-03-09 DIAGNOSIS — I2510 Atherosclerotic heart disease of native coronary artery without angina pectoris: Secondary | ICD-10-CM | POA: Diagnosis not present

## 2019-03-09 DIAGNOSIS — R499 Unspecified voice and resonance disorder: Secondary | ICD-10-CM | POA: Diagnosis not present

## 2019-03-10 DIAGNOSIS — R2689 Other abnormalities of gait and mobility: Secondary | ICD-10-CM | POA: Diagnosis not present

## 2019-03-10 DIAGNOSIS — G320 Subacute combined degeneration of spinal cord in diseases classified elsewhere: Secondary | ICD-10-CM | POA: Diagnosis not present

## 2019-03-10 DIAGNOSIS — R41841 Cognitive communication deficit: Secondary | ICD-10-CM | POA: Diagnosis not present

## 2019-03-10 DIAGNOSIS — R279 Unspecified lack of coordination: Secondary | ICD-10-CM | POA: Diagnosis not present

## 2019-03-10 DIAGNOSIS — M6281 Muscle weakness (generalized): Secondary | ICD-10-CM | POA: Diagnosis not present

## 2019-03-10 DIAGNOSIS — I2510 Atherosclerotic heart disease of native coronary artery without angina pectoris: Secondary | ICD-10-CM | POA: Diagnosis not present

## 2019-03-10 DIAGNOSIS — R499 Unspecified voice and resonance disorder: Secondary | ICD-10-CM | POA: Diagnosis not present

## 2019-03-11 DIAGNOSIS — R279 Unspecified lack of coordination: Secondary | ICD-10-CM | POA: Diagnosis not present

## 2019-03-11 DIAGNOSIS — G320 Subacute combined degeneration of spinal cord in diseases classified elsewhere: Secondary | ICD-10-CM | POA: Diagnosis not present

## 2019-03-11 DIAGNOSIS — R499 Unspecified voice and resonance disorder: Secondary | ICD-10-CM | POA: Diagnosis not present

## 2019-03-11 DIAGNOSIS — R41841 Cognitive communication deficit: Secondary | ICD-10-CM | POA: Diagnosis not present

## 2019-03-11 DIAGNOSIS — M6281 Muscle weakness (generalized): Secondary | ICD-10-CM | POA: Diagnosis not present

## 2019-03-11 DIAGNOSIS — R2689 Other abnormalities of gait and mobility: Secondary | ICD-10-CM | POA: Diagnosis not present

## 2019-03-11 DIAGNOSIS — I2510 Atherosclerotic heart disease of native coronary artery without angina pectoris: Secondary | ICD-10-CM | POA: Diagnosis not present

## 2019-03-14 DIAGNOSIS — M6281 Muscle weakness (generalized): Secondary | ICD-10-CM | POA: Diagnosis not present

## 2019-03-14 DIAGNOSIS — R2689 Other abnormalities of gait and mobility: Secondary | ICD-10-CM | POA: Diagnosis not present

## 2019-03-14 DIAGNOSIS — R41841 Cognitive communication deficit: Secondary | ICD-10-CM | POA: Diagnosis not present

## 2019-03-14 DIAGNOSIS — I2510 Atherosclerotic heart disease of native coronary artery without angina pectoris: Secondary | ICD-10-CM | POA: Diagnosis not present

## 2019-03-14 DIAGNOSIS — G320 Subacute combined degeneration of spinal cord in diseases classified elsewhere: Secondary | ICD-10-CM | POA: Diagnosis not present

## 2019-03-14 DIAGNOSIS — R499 Unspecified voice and resonance disorder: Secondary | ICD-10-CM | POA: Diagnosis not present

## 2019-03-14 DIAGNOSIS — R279 Unspecified lack of coordination: Secondary | ICD-10-CM | POA: Diagnosis not present

## 2019-03-15 DIAGNOSIS — R279 Unspecified lack of coordination: Secondary | ICD-10-CM | POA: Diagnosis not present

## 2019-03-15 DIAGNOSIS — G320 Subacute combined degeneration of spinal cord in diseases classified elsewhere: Secondary | ICD-10-CM | POA: Diagnosis not present

## 2019-03-15 DIAGNOSIS — I2510 Atherosclerotic heart disease of native coronary artery without angina pectoris: Secondary | ICD-10-CM | POA: Diagnosis not present

## 2019-03-15 DIAGNOSIS — R2689 Other abnormalities of gait and mobility: Secondary | ICD-10-CM | POA: Diagnosis not present

## 2019-03-15 DIAGNOSIS — M6281 Muscle weakness (generalized): Secondary | ICD-10-CM | POA: Diagnosis not present

## 2019-03-15 DIAGNOSIS — R499 Unspecified voice and resonance disorder: Secondary | ICD-10-CM | POA: Diagnosis not present

## 2019-03-15 DIAGNOSIS — R41841 Cognitive communication deficit: Secondary | ICD-10-CM | POA: Diagnosis not present

## 2019-03-16 DIAGNOSIS — I2510 Atherosclerotic heart disease of native coronary artery without angina pectoris: Secondary | ICD-10-CM | POA: Diagnosis not present

## 2019-03-16 DIAGNOSIS — M6281 Muscle weakness (generalized): Secondary | ICD-10-CM | POA: Diagnosis not present

## 2019-03-16 DIAGNOSIS — R41841 Cognitive communication deficit: Secondary | ICD-10-CM | POA: Diagnosis not present

## 2019-03-16 DIAGNOSIS — G320 Subacute combined degeneration of spinal cord in diseases classified elsewhere: Secondary | ICD-10-CM | POA: Diagnosis not present

## 2019-03-16 DIAGNOSIS — R499 Unspecified voice and resonance disorder: Secondary | ICD-10-CM | POA: Diagnosis not present

## 2019-03-16 DIAGNOSIS — R279 Unspecified lack of coordination: Secondary | ICD-10-CM | POA: Diagnosis not present

## 2019-03-16 DIAGNOSIS — R2689 Other abnormalities of gait and mobility: Secondary | ICD-10-CM | POA: Diagnosis not present

## 2019-03-17 ENCOUNTER — Encounter
Admission: RE | Admit: 2019-03-17 | Discharge: 2019-03-17 | Disposition: A | Discharge status: Home / Self Care | Payer: Medicare HMO | Source: Ambulatory Visit | Attending: Internal Medicine | Admitting: Internal Medicine

## 2019-03-17 DIAGNOSIS — R2689 Other abnormalities of gait and mobility: Secondary | ICD-10-CM | POA: Diagnosis not present

## 2019-03-17 DIAGNOSIS — R499 Unspecified voice and resonance disorder: Secondary | ICD-10-CM | POA: Diagnosis not present

## 2019-03-17 DIAGNOSIS — R41841 Cognitive communication deficit: Secondary | ICD-10-CM | POA: Diagnosis not present

## 2019-03-17 DIAGNOSIS — R279 Unspecified lack of coordination: Secondary | ICD-10-CM | POA: Diagnosis not present

## 2019-03-17 DIAGNOSIS — I2510 Atherosclerotic heart disease of native coronary artery without angina pectoris: Secondary | ICD-10-CM | POA: Diagnosis not present

## 2019-03-17 DIAGNOSIS — G20 Parkinson's disease: Secondary | ICD-10-CM | POA: Diagnosis not present

## 2019-03-17 DIAGNOSIS — M6281 Muscle weakness (generalized): Secondary | ICD-10-CM | POA: Diagnosis not present

## 2019-03-18 DIAGNOSIS — I2510 Atherosclerotic heart disease of native coronary artery without angina pectoris: Secondary | ICD-10-CM | POA: Diagnosis not present

## 2019-03-21 DIAGNOSIS — I2510 Atherosclerotic heart disease of native coronary artery without angina pectoris: Secondary | ICD-10-CM | POA: Diagnosis not present

## 2019-03-22 DIAGNOSIS — I2510 Atherosclerotic heart disease of native coronary artery without angina pectoris: Secondary | ICD-10-CM | POA: Diagnosis not present

## 2019-03-23 DIAGNOSIS — I2510 Atherosclerotic heart disease of native coronary artery without angina pectoris: Secondary | ICD-10-CM | POA: Diagnosis not present

## 2019-03-24 DIAGNOSIS — I2510 Atherosclerotic heart disease of native coronary artery without angina pectoris: Secondary | ICD-10-CM | POA: Diagnosis not present

## 2019-03-25 DIAGNOSIS — I2510 Atherosclerotic heart disease of native coronary artery without angina pectoris: Secondary | ICD-10-CM | POA: Diagnosis not present

## 2019-03-28 DIAGNOSIS — I2510 Atherosclerotic heart disease of native coronary artery without angina pectoris: Secondary | ICD-10-CM | POA: Diagnosis not present

## 2019-03-29 DIAGNOSIS — I2510 Atherosclerotic heart disease of native coronary artery without angina pectoris: Secondary | ICD-10-CM | POA: Diagnosis not present

## 2019-03-30 DIAGNOSIS — I2510 Atherosclerotic heart disease of native coronary artery without angina pectoris: Secondary | ICD-10-CM | POA: Diagnosis not present

## 2019-03-31 DIAGNOSIS — I2510 Atherosclerotic heart disease of native coronary artery without angina pectoris: Secondary | ICD-10-CM | POA: Diagnosis not present

## 2019-04-01 DIAGNOSIS — I2510 Atherosclerotic heart disease of native coronary artery without angina pectoris: Secondary | ICD-10-CM | POA: Diagnosis not present

## 2019-04-04 DIAGNOSIS — I2510 Atherosclerotic heart disease of native coronary artery without angina pectoris: Secondary | ICD-10-CM | POA: Diagnosis not present

## 2019-04-05 DIAGNOSIS — I2510 Atherosclerotic heart disease of native coronary artery without angina pectoris: Secondary | ICD-10-CM | POA: Diagnosis not present

## 2019-04-06 DIAGNOSIS — I2510 Atherosclerotic heart disease of native coronary artery without angina pectoris: Secondary | ICD-10-CM | POA: Diagnosis not present

## 2019-04-07 DIAGNOSIS — I2510 Atherosclerotic heart disease of native coronary artery without angina pectoris: Secondary | ICD-10-CM | POA: Diagnosis not present

## 2019-04-08 DIAGNOSIS — I2510 Atherosclerotic heart disease of native coronary artery without angina pectoris: Secondary | ICD-10-CM | POA: Diagnosis not present

## 2019-04-11 DIAGNOSIS — I2510 Atherosclerotic heart disease of native coronary artery without angina pectoris: Secondary | ICD-10-CM | POA: Diagnosis not present

## 2019-04-12 DIAGNOSIS — I2510 Atherosclerotic heart disease of native coronary artery without angina pectoris: Secondary | ICD-10-CM | POA: Diagnosis not present

## 2019-04-13 DIAGNOSIS — I2510 Atherosclerotic heart disease of native coronary artery without angina pectoris: Secondary | ICD-10-CM | POA: Diagnosis not present

## 2019-04-14 DIAGNOSIS — I2510 Atherosclerotic heart disease of native coronary artery without angina pectoris: Secondary | ICD-10-CM | POA: Diagnosis not present

## 2019-04-15 DIAGNOSIS — I2510 Atherosclerotic heart disease of native coronary artery without angina pectoris: Secondary | ICD-10-CM | POA: Diagnosis not present

## 2019-04-18 DIAGNOSIS — R279 Unspecified lack of coordination: Secondary | ICD-10-CM | POA: Diagnosis not present

## 2019-04-18 DIAGNOSIS — I2510 Atherosclerotic heart disease of native coronary artery without angina pectoris: Secondary | ICD-10-CM | POA: Diagnosis not present

## 2019-04-18 DIAGNOSIS — R499 Unspecified voice and resonance disorder: Secondary | ICD-10-CM | POA: Diagnosis not present

## 2019-04-18 DIAGNOSIS — R41841 Cognitive communication deficit: Secondary | ICD-10-CM | POA: Diagnosis not present

## 2019-04-18 DIAGNOSIS — M6281 Muscle weakness (generalized): Secondary | ICD-10-CM | POA: Diagnosis not present

## 2019-04-18 DIAGNOSIS — G20 Parkinson's disease: Secondary | ICD-10-CM | POA: Diagnosis not present

## 2019-04-18 DIAGNOSIS — R2689 Other abnormalities of gait and mobility: Secondary | ICD-10-CM | POA: Diagnosis not present

## 2019-04-19 DIAGNOSIS — M6281 Muscle weakness (generalized): Secondary | ICD-10-CM | POA: Diagnosis not present

## 2019-04-19 DIAGNOSIS — R2689 Other abnormalities of gait and mobility: Secondary | ICD-10-CM | POA: Diagnosis not present

## 2019-04-19 DIAGNOSIS — I2510 Atherosclerotic heart disease of native coronary artery without angina pectoris: Secondary | ICD-10-CM | POA: Diagnosis not present

## 2019-04-19 DIAGNOSIS — R41841 Cognitive communication deficit: Secondary | ICD-10-CM | POA: Diagnosis not present

## 2019-04-19 DIAGNOSIS — R279 Unspecified lack of coordination: Secondary | ICD-10-CM | POA: Diagnosis not present

## 2019-04-19 DIAGNOSIS — G20 Parkinson's disease: Secondary | ICD-10-CM | POA: Diagnosis not present

## 2019-04-19 DIAGNOSIS — R499 Unspecified voice and resonance disorder: Secondary | ICD-10-CM | POA: Diagnosis not present

## 2019-04-20 DIAGNOSIS — R279 Unspecified lack of coordination: Secondary | ICD-10-CM | POA: Diagnosis not present

## 2019-04-20 DIAGNOSIS — R41841 Cognitive communication deficit: Secondary | ICD-10-CM | POA: Diagnosis not present

## 2019-04-20 DIAGNOSIS — M6281 Muscle weakness (generalized): Secondary | ICD-10-CM | POA: Diagnosis not present

## 2019-04-20 DIAGNOSIS — I2510 Atherosclerotic heart disease of native coronary artery without angina pectoris: Secondary | ICD-10-CM | POA: Diagnosis not present

## 2019-04-20 DIAGNOSIS — R499 Unspecified voice and resonance disorder: Secondary | ICD-10-CM | POA: Diagnosis not present

## 2019-04-20 DIAGNOSIS — R2689 Other abnormalities of gait and mobility: Secondary | ICD-10-CM | POA: Diagnosis not present

## 2019-04-20 DIAGNOSIS — G20 Parkinson's disease: Secondary | ICD-10-CM | POA: Diagnosis not present

## 2019-04-21 DIAGNOSIS — R41841 Cognitive communication deficit: Secondary | ICD-10-CM | POA: Diagnosis not present

## 2019-04-21 DIAGNOSIS — R499 Unspecified voice and resonance disorder: Secondary | ICD-10-CM | POA: Diagnosis not present

## 2019-04-21 DIAGNOSIS — G20 Parkinson's disease: Secondary | ICD-10-CM | POA: Diagnosis not present

## 2019-04-21 DIAGNOSIS — R279 Unspecified lack of coordination: Secondary | ICD-10-CM | POA: Diagnosis not present

## 2019-04-21 DIAGNOSIS — R2689 Other abnormalities of gait and mobility: Secondary | ICD-10-CM | POA: Diagnosis not present

## 2019-04-21 DIAGNOSIS — M6281 Muscle weakness (generalized): Secondary | ICD-10-CM | POA: Diagnosis not present

## 2019-04-21 DIAGNOSIS — I2510 Atherosclerotic heart disease of native coronary artery without angina pectoris: Secondary | ICD-10-CM | POA: Diagnosis not present

## 2019-04-22 DIAGNOSIS — G20 Parkinson's disease: Secondary | ICD-10-CM | POA: Diagnosis not present

## 2019-04-22 DIAGNOSIS — R279 Unspecified lack of coordination: Secondary | ICD-10-CM | POA: Diagnosis not present

## 2019-04-22 DIAGNOSIS — I2510 Atherosclerotic heart disease of native coronary artery without angina pectoris: Secondary | ICD-10-CM | POA: Diagnosis not present

## 2019-04-22 DIAGNOSIS — R2689 Other abnormalities of gait and mobility: Secondary | ICD-10-CM | POA: Diagnosis not present

## 2019-04-22 DIAGNOSIS — M6281 Muscle weakness (generalized): Secondary | ICD-10-CM | POA: Diagnosis not present

## 2019-04-22 DIAGNOSIS — R41841 Cognitive communication deficit: Secondary | ICD-10-CM | POA: Diagnosis not present

## 2019-04-22 DIAGNOSIS — R499 Unspecified voice and resonance disorder: Secondary | ICD-10-CM | POA: Diagnosis not present

## 2019-04-25 ENCOUNTER — Encounter
Admission: RE | Admit: 2019-04-25 | Discharge: 2019-04-25 | Disposition: A | Discharge status: Home / Self Care | Payer: Medicare HMO | Source: Ambulatory Visit | Attending: Internal Medicine | Admitting: Internal Medicine

## 2019-04-25 DIAGNOSIS — R279 Unspecified lack of coordination: Secondary | ICD-10-CM | POA: Diagnosis not present

## 2019-04-25 DIAGNOSIS — R2689 Other abnormalities of gait and mobility: Secondary | ICD-10-CM | POA: Diagnosis not present

## 2019-04-25 DIAGNOSIS — I2510 Atherosclerotic heart disease of native coronary artery without angina pectoris: Secondary | ICD-10-CM | POA: Diagnosis not present

## 2019-04-25 DIAGNOSIS — G20 Parkinson's disease: Secondary | ICD-10-CM | POA: Diagnosis not present

## 2019-04-25 DIAGNOSIS — M6281 Muscle weakness (generalized): Secondary | ICD-10-CM | POA: Diagnosis not present

## 2019-04-25 DIAGNOSIS — R41841 Cognitive communication deficit: Secondary | ICD-10-CM | POA: Diagnosis not present

## 2019-04-25 DIAGNOSIS — R499 Unspecified voice and resonance disorder: Secondary | ICD-10-CM | POA: Diagnosis not present

## 2019-04-26 DIAGNOSIS — M6281 Muscle weakness (generalized): Secondary | ICD-10-CM | POA: Diagnosis not present

## 2019-04-26 DIAGNOSIS — R41841 Cognitive communication deficit: Secondary | ICD-10-CM | POA: Diagnosis not present

## 2019-04-26 DIAGNOSIS — I2510 Atherosclerotic heart disease of native coronary artery without angina pectoris: Secondary | ICD-10-CM | POA: Diagnosis not present

## 2019-04-26 DIAGNOSIS — R279 Unspecified lack of coordination: Secondary | ICD-10-CM | POA: Diagnosis not present

## 2019-04-26 DIAGNOSIS — R2689 Other abnormalities of gait and mobility: Secondary | ICD-10-CM | POA: Diagnosis not present

## 2019-04-26 DIAGNOSIS — G20 Parkinson's disease: Secondary | ICD-10-CM | POA: Diagnosis not present

## 2019-04-26 DIAGNOSIS — R499 Unspecified voice and resonance disorder: Secondary | ICD-10-CM | POA: Diagnosis not present

## 2019-04-27 DIAGNOSIS — I2510 Atherosclerotic heart disease of native coronary artery without angina pectoris: Secondary | ICD-10-CM | POA: Diagnosis not present

## 2019-04-27 DIAGNOSIS — R2689 Other abnormalities of gait and mobility: Secondary | ICD-10-CM | POA: Diagnosis not present

## 2019-04-27 DIAGNOSIS — R499 Unspecified voice and resonance disorder: Secondary | ICD-10-CM | POA: Diagnosis not present

## 2019-04-27 DIAGNOSIS — M6281 Muscle weakness (generalized): Secondary | ICD-10-CM | POA: Diagnosis not present

## 2019-04-27 DIAGNOSIS — R279 Unspecified lack of coordination: Secondary | ICD-10-CM | POA: Diagnosis not present

## 2019-04-27 DIAGNOSIS — R41841 Cognitive communication deficit: Secondary | ICD-10-CM | POA: Diagnosis not present

## 2019-04-27 DIAGNOSIS — G20 Parkinson's disease: Secondary | ICD-10-CM | POA: Diagnosis not present

## 2019-04-28 DIAGNOSIS — R41841 Cognitive communication deficit: Secondary | ICD-10-CM | POA: Diagnosis not present

## 2019-04-28 DIAGNOSIS — R499 Unspecified voice and resonance disorder: Secondary | ICD-10-CM | POA: Diagnosis not present

## 2019-04-28 DIAGNOSIS — I2510 Atherosclerotic heart disease of native coronary artery without angina pectoris: Secondary | ICD-10-CM | POA: Diagnosis not present

## 2019-04-28 DIAGNOSIS — M6281 Muscle weakness (generalized): Secondary | ICD-10-CM | POA: Diagnosis not present

## 2019-04-28 DIAGNOSIS — R2689 Other abnormalities of gait and mobility: Secondary | ICD-10-CM | POA: Diagnosis not present

## 2019-04-28 DIAGNOSIS — G20 Parkinson's disease: Secondary | ICD-10-CM | POA: Diagnosis not present

## 2019-04-28 DIAGNOSIS — R279 Unspecified lack of coordination: Secondary | ICD-10-CM | POA: Diagnosis not present

## 2019-04-29 DIAGNOSIS — I2510 Atherosclerotic heart disease of native coronary artery without angina pectoris: Secondary | ICD-10-CM | POA: Diagnosis not present

## 2019-04-29 DIAGNOSIS — R2689 Other abnormalities of gait and mobility: Secondary | ICD-10-CM | POA: Diagnosis not present

## 2019-04-29 DIAGNOSIS — R499 Unspecified voice and resonance disorder: Secondary | ICD-10-CM | POA: Diagnosis not present

## 2019-04-29 DIAGNOSIS — M6281 Muscle weakness (generalized): Secondary | ICD-10-CM | POA: Diagnosis not present

## 2019-04-29 DIAGNOSIS — R279 Unspecified lack of coordination: Secondary | ICD-10-CM | POA: Diagnosis not present

## 2019-04-29 DIAGNOSIS — R41841 Cognitive communication deficit: Secondary | ICD-10-CM | POA: Diagnosis not present

## 2019-04-29 DIAGNOSIS — G20 Parkinson's disease: Secondary | ICD-10-CM | POA: Diagnosis not present

## 2019-05-02 DIAGNOSIS — R41841 Cognitive communication deficit: Secondary | ICD-10-CM | POA: Diagnosis not present

## 2019-05-02 DIAGNOSIS — M6281 Muscle weakness (generalized): Secondary | ICD-10-CM | POA: Diagnosis not present

## 2019-05-02 DIAGNOSIS — R2689 Other abnormalities of gait and mobility: Secondary | ICD-10-CM | POA: Diagnosis not present

## 2019-05-02 DIAGNOSIS — R279 Unspecified lack of coordination: Secondary | ICD-10-CM | POA: Diagnosis not present

## 2019-05-02 DIAGNOSIS — G20 Parkinson's disease: Secondary | ICD-10-CM | POA: Diagnosis not present

## 2019-05-02 DIAGNOSIS — R499 Unspecified voice and resonance disorder: Secondary | ICD-10-CM | POA: Diagnosis not present

## 2019-05-02 DIAGNOSIS — I2510 Atherosclerotic heart disease of native coronary artery without angina pectoris: Secondary | ICD-10-CM | POA: Diagnosis not present

## 2019-05-03 DIAGNOSIS — R2689 Other abnormalities of gait and mobility: Secondary | ICD-10-CM | POA: Diagnosis not present

## 2019-05-03 DIAGNOSIS — I2510 Atherosclerotic heart disease of native coronary artery without angina pectoris: Secondary | ICD-10-CM | POA: Diagnosis not present

## 2019-05-03 DIAGNOSIS — R279 Unspecified lack of coordination: Secondary | ICD-10-CM | POA: Diagnosis not present

## 2019-05-03 DIAGNOSIS — R41841 Cognitive communication deficit: Secondary | ICD-10-CM | POA: Diagnosis not present

## 2019-05-03 DIAGNOSIS — G20 Parkinson's disease: Secondary | ICD-10-CM | POA: Diagnosis not present

## 2019-05-03 DIAGNOSIS — M6281 Muscle weakness (generalized): Secondary | ICD-10-CM | POA: Diagnosis not present

## 2019-05-03 DIAGNOSIS — R499 Unspecified voice and resonance disorder: Secondary | ICD-10-CM | POA: Diagnosis not present

## 2019-05-04 DIAGNOSIS — R2689 Other abnormalities of gait and mobility: Secondary | ICD-10-CM | POA: Diagnosis not present

## 2019-05-04 DIAGNOSIS — R41841 Cognitive communication deficit: Secondary | ICD-10-CM | POA: Diagnosis not present

## 2019-05-04 DIAGNOSIS — R279 Unspecified lack of coordination: Secondary | ICD-10-CM | POA: Diagnosis not present

## 2019-05-04 DIAGNOSIS — I2510 Atherosclerotic heart disease of native coronary artery without angina pectoris: Secondary | ICD-10-CM | POA: Diagnosis not present

## 2019-05-04 DIAGNOSIS — R499 Unspecified voice and resonance disorder: Secondary | ICD-10-CM | POA: Diagnosis not present

## 2019-05-04 DIAGNOSIS — M6281 Muscle weakness (generalized): Secondary | ICD-10-CM | POA: Diagnosis not present

## 2019-05-04 DIAGNOSIS — G20 Parkinson's disease: Secondary | ICD-10-CM | POA: Diagnosis not present

## 2019-05-05 DIAGNOSIS — R499 Unspecified voice and resonance disorder: Secondary | ICD-10-CM | POA: Diagnosis not present

## 2019-05-05 DIAGNOSIS — R2689 Other abnormalities of gait and mobility: Secondary | ICD-10-CM | POA: Diagnosis not present

## 2019-05-05 DIAGNOSIS — I2510 Atherosclerotic heart disease of native coronary artery without angina pectoris: Secondary | ICD-10-CM | POA: Diagnosis not present

## 2019-05-05 DIAGNOSIS — R41841 Cognitive communication deficit: Secondary | ICD-10-CM | POA: Diagnosis not present

## 2019-05-05 DIAGNOSIS — R279 Unspecified lack of coordination: Secondary | ICD-10-CM | POA: Diagnosis not present

## 2019-05-05 DIAGNOSIS — M6281 Muscle weakness (generalized): Secondary | ICD-10-CM | POA: Diagnosis not present

## 2019-05-05 DIAGNOSIS — G20 Parkinson's disease: Secondary | ICD-10-CM | POA: Diagnosis not present

## 2019-05-06 DIAGNOSIS — R499 Unspecified voice and resonance disorder: Secondary | ICD-10-CM | POA: Diagnosis not present

## 2019-05-06 DIAGNOSIS — R279 Unspecified lack of coordination: Secondary | ICD-10-CM | POA: Diagnosis not present

## 2019-05-06 DIAGNOSIS — R2689 Other abnormalities of gait and mobility: Secondary | ICD-10-CM | POA: Diagnosis not present

## 2019-05-06 DIAGNOSIS — G20 Parkinson's disease: Secondary | ICD-10-CM | POA: Diagnosis not present

## 2019-05-06 DIAGNOSIS — I2510 Atherosclerotic heart disease of native coronary artery without angina pectoris: Secondary | ICD-10-CM | POA: Diagnosis not present

## 2019-05-06 DIAGNOSIS — M6281 Muscle weakness (generalized): Secondary | ICD-10-CM | POA: Diagnosis not present

## 2019-05-06 DIAGNOSIS — R41841 Cognitive communication deficit: Secondary | ICD-10-CM | POA: Diagnosis not present

## 2019-05-09 DIAGNOSIS — R41841 Cognitive communication deficit: Secondary | ICD-10-CM | POA: Diagnosis not present

## 2019-05-09 DIAGNOSIS — R279 Unspecified lack of coordination: Secondary | ICD-10-CM | POA: Diagnosis not present

## 2019-05-09 DIAGNOSIS — I2510 Atherosclerotic heart disease of native coronary artery without angina pectoris: Secondary | ICD-10-CM | POA: Diagnosis not present

## 2019-05-09 DIAGNOSIS — M6281 Muscle weakness (generalized): Secondary | ICD-10-CM | POA: Diagnosis not present

## 2019-05-09 DIAGNOSIS — G20 Parkinson's disease: Secondary | ICD-10-CM | POA: Diagnosis not present

## 2019-05-09 DIAGNOSIS — R2689 Other abnormalities of gait and mobility: Secondary | ICD-10-CM | POA: Diagnosis not present

## 2019-05-09 DIAGNOSIS — R499 Unspecified voice and resonance disorder: Secondary | ICD-10-CM | POA: Diagnosis not present

## 2019-05-10 ENCOUNTER — Other Ambulatory Visit
Admission: RE | Admit: 2019-05-10 | Discharge: 2019-05-10 | Disposition: A | Discharge status: Home / Self Care | Payer: Medicare HMO | Source: Ambulatory Visit | Attending: Internal Medicine | Admitting: Internal Medicine

## 2019-05-10 DIAGNOSIS — R2689 Other abnormalities of gait and mobility: Secondary | ICD-10-CM | POA: Diagnosis not present

## 2019-05-10 DIAGNOSIS — M6281 Muscle weakness (generalized): Secondary | ICD-10-CM | POA: Diagnosis not present

## 2019-05-10 DIAGNOSIS — R350 Frequency of micturition: Secondary | ICD-10-CM | POA: Diagnosis present

## 2019-05-10 DIAGNOSIS — R499 Unspecified voice and resonance disorder: Secondary | ICD-10-CM | POA: Diagnosis not present

## 2019-05-10 DIAGNOSIS — R41841 Cognitive communication deficit: Secondary | ICD-10-CM | POA: Diagnosis not present

## 2019-05-10 DIAGNOSIS — R3915 Urgency of urination: Secondary | ICD-10-CM | POA: Diagnosis not present

## 2019-05-10 DIAGNOSIS — I2510 Atherosclerotic heart disease of native coronary artery without angina pectoris: Secondary | ICD-10-CM | POA: Diagnosis not present

## 2019-05-10 DIAGNOSIS — R279 Unspecified lack of coordination: Secondary | ICD-10-CM | POA: Diagnosis not present

## 2019-05-10 DIAGNOSIS — G20 Parkinson's disease: Secondary | ICD-10-CM | POA: Diagnosis not present

## 2019-05-10 LAB — URINALYSIS, ROUTINE W REFLEX MICROSCOPIC
Bilirubin Urine: NEGATIVE
Glucose, UA: NEGATIVE mg/dL
Hgb urine dipstick: NEGATIVE
Ketones, ur: NEGATIVE mg/dL
Leukocytes,Ua: NEGATIVE
Nitrite: NEGATIVE
Protein, ur: NEGATIVE mg/dL
Specific Gravity, Urine: 1.011 (ref 1.005–1.030)
pH: 7 (ref 5.0–8.0)

## 2019-05-11 DIAGNOSIS — I2510 Atherosclerotic heart disease of native coronary artery without angina pectoris: Secondary | ICD-10-CM | POA: Diagnosis not present

## 2019-05-11 DIAGNOSIS — R2689 Other abnormalities of gait and mobility: Secondary | ICD-10-CM | POA: Diagnosis not present

## 2019-05-11 DIAGNOSIS — G20 Parkinson's disease: Secondary | ICD-10-CM | POA: Diagnosis not present

## 2019-05-11 DIAGNOSIS — M6281 Muscle weakness (generalized): Secondary | ICD-10-CM | POA: Diagnosis not present

## 2019-05-11 DIAGNOSIS — R499 Unspecified voice and resonance disorder: Secondary | ICD-10-CM | POA: Diagnosis not present

## 2019-05-11 DIAGNOSIS — R279 Unspecified lack of coordination: Secondary | ICD-10-CM | POA: Diagnosis not present

## 2019-05-11 DIAGNOSIS — R41841 Cognitive communication deficit: Secondary | ICD-10-CM | POA: Diagnosis not present

## 2019-05-11 LAB — URINE CULTURE: Culture: <10000 — AB

## 2019-05-13 DIAGNOSIS — G20 Parkinson's disease: Secondary | ICD-10-CM | POA: Diagnosis not present

## 2019-05-13 DIAGNOSIS — R41841 Cognitive communication deficit: Secondary | ICD-10-CM | POA: Diagnosis not present

## 2019-05-13 DIAGNOSIS — R2689 Other abnormalities of gait and mobility: Secondary | ICD-10-CM | POA: Diagnosis not present

## 2019-05-13 DIAGNOSIS — R279 Unspecified lack of coordination: Secondary | ICD-10-CM | POA: Diagnosis not present

## 2019-05-13 DIAGNOSIS — I2510 Atherosclerotic heart disease of native coronary artery without angina pectoris: Secondary | ICD-10-CM | POA: Diagnosis not present

## 2019-05-13 DIAGNOSIS — R499 Unspecified voice and resonance disorder: Secondary | ICD-10-CM | POA: Diagnosis not present

## 2019-05-13 DIAGNOSIS — M6281 Muscle weakness (generalized): Secondary | ICD-10-CM | POA: Diagnosis not present

## 2019-05-16 DIAGNOSIS — G20 Parkinson's disease: Secondary | ICD-10-CM | POA: Diagnosis not present

## 2019-05-16 DIAGNOSIS — I2510 Atherosclerotic heart disease of native coronary artery without angina pectoris: Secondary | ICD-10-CM | POA: Diagnosis not present

## 2019-05-16 DIAGNOSIS — M6281 Muscle weakness (generalized): Secondary | ICD-10-CM | POA: Diagnosis not present

## 2019-05-16 DIAGNOSIS — R499 Unspecified voice and resonance disorder: Secondary | ICD-10-CM | POA: Diagnosis not present

## 2019-05-16 DIAGNOSIS — R2689 Other abnormalities of gait and mobility: Secondary | ICD-10-CM | POA: Diagnosis not present

## 2019-05-16 DIAGNOSIS — R279 Unspecified lack of coordination: Secondary | ICD-10-CM | POA: Diagnosis not present

## 2019-05-16 DIAGNOSIS — R41841 Cognitive communication deficit: Secondary | ICD-10-CM | POA: Diagnosis not present

## 2019-05-17 DIAGNOSIS — R2689 Other abnormalities of gait and mobility: Secondary | ICD-10-CM | POA: Diagnosis not present

## 2019-05-17 DIAGNOSIS — R279 Unspecified lack of coordination: Secondary | ICD-10-CM | POA: Diagnosis not present

## 2019-05-17 DIAGNOSIS — R41841 Cognitive communication deficit: Secondary | ICD-10-CM | POA: Diagnosis not present

## 2019-05-17 DIAGNOSIS — G20 Parkinson's disease: Secondary | ICD-10-CM | POA: Diagnosis not present

## 2019-05-17 DIAGNOSIS — M6281 Muscle weakness (generalized): Secondary | ICD-10-CM | POA: Diagnosis not present

## 2019-05-17 DIAGNOSIS — I2510 Atherosclerotic heart disease of native coronary artery without angina pectoris: Secondary | ICD-10-CM | POA: Diagnosis not present

## 2019-05-17 DIAGNOSIS — R499 Unspecified voice and resonance disorder: Secondary | ICD-10-CM | POA: Diagnosis not present

## 2019-05-18 DIAGNOSIS — R279 Unspecified lack of coordination: Secondary | ICD-10-CM | POA: Diagnosis not present

## 2019-05-18 DIAGNOSIS — G20 Parkinson's disease: Secondary | ICD-10-CM | POA: Diagnosis not present

## 2019-05-18 DIAGNOSIS — R2689 Other abnormalities of gait and mobility: Secondary | ICD-10-CM | POA: Diagnosis not present

## 2019-05-18 DIAGNOSIS — I2510 Atherosclerotic heart disease of native coronary artery without angina pectoris: Secondary | ICD-10-CM | POA: Diagnosis not present

## 2019-05-18 DIAGNOSIS — M6281 Muscle weakness (generalized): Secondary | ICD-10-CM | POA: Diagnosis not present

## 2019-05-18 DIAGNOSIS — R499 Unspecified voice and resonance disorder: Secondary | ICD-10-CM | POA: Diagnosis not present

## 2019-05-18 DIAGNOSIS — R41841 Cognitive communication deficit: Secondary | ICD-10-CM | POA: Diagnosis not present

## 2019-05-19 DIAGNOSIS — M6281 Muscle weakness (generalized): Secondary | ICD-10-CM | POA: Diagnosis not present

## 2019-05-19 DIAGNOSIS — I2510 Atherosclerotic heart disease of native coronary artery without angina pectoris: Secondary | ICD-10-CM | POA: Diagnosis not present

## 2019-05-19 DIAGNOSIS — G20 Parkinson's disease: Secondary | ICD-10-CM | POA: Diagnosis not present

## 2019-05-19 DIAGNOSIS — R41841 Cognitive communication deficit: Secondary | ICD-10-CM | POA: Diagnosis not present

## 2019-05-19 DIAGNOSIS — R499 Unspecified voice and resonance disorder: Secondary | ICD-10-CM | POA: Diagnosis not present

## 2019-05-19 DIAGNOSIS — R279 Unspecified lack of coordination: Secondary | ICD-10-CM | POA: Diagnosis not present

## 2019-05-19 DIAGNOSIS — R2689 Other abnormalities of gait and mobility: Secondary | ICD-10-CM | POA: Diagnosis not present

## 2019-05-20 DIAGNOSIS — I2510 Atherosclerotic heart disease of native coronary artery without angina pectoris: Secondary | ICD-10-CM | POA: Diagnosis not present

## 2019-05-20 DIAGNOSIS — R2689 Other abnormalities of gait and mobility: Secondary | ICD-10-CM | POA: Diagnosis not present

## 2019-05-20 DIAGNOSIS — M6281 Muscle weakness (generalized): Secondary | ICD-10-CM | POA: Diagnosis not present

## 2019-05-20 DIAGNOSIS — R499 Unspecified voice and resonance disorder: Secondary | ICD-10-CM | POA: Diagnosis not present

## 2019-05-20 DIAGNOSIS — R279 Unspecified lack of coordination: Secondary | ICD-10-CM | POA: Diagnosis not present

## 2019-05-20 DIAGNOSIS — R41841 Cognitive communication deficit: Secondary | ICD-10-CM | POA: Diagnosis not present

## 2019-05-20 DIAGNOSIS — G20 Parkinson's disease: Secondary | ICD-10-CM | POA: Diagnosis not present

## 2019-05-23 DIAGNOSIS — G20 Parkinson's disease: Secondary | ICD-10-CM | POA: Diagnosis not present

## 2019-05-23 DIAGNOSIS — I2510 Atherosclerotic heart disease of native coronary artery without angina pectoris: Secondary | ICD-10-CM | POA: Diagnosis not present

## 2019-05-23 DIAGNOSIS — R279 Unspecified lack of coordination: Secondary | ICD-10-CM | POA: Diagnosis not present

## 2019-05-23 DIAGNOSIS — R41841 Cognitive communication deficit: Secondary | ICD-10-CM | POA: Diagnosis not present

## 2019-05-23 DIAGNOSIS — R499 Unspecified voice and resonance disorder: Secondary | ICD-10-CM | POA: Diagnosis not present

## 2019-05-23 DIAGNOSIS — R2689 Other abnormalities of gait and mobility: Secondary | ICD-10-CM | POA: Diagnosis not present

## 2019-05-23 DIAGNOSIS — M6281 Muscle weakness (generalized): Secondary | ICD-10-CM | POA: Diagnosis not present

## 2019-05-24 DIAGNOSIS — R41841 Cognitive communication deficit: Secondary | ICD-10-CM | POA: Diagnosis not present

## 2019-05-24 DIAGNOSIS — R2689 Other abnormalities of gait and mobility: Secondary | ICD-10-CM | POA: Diagnosis not present

## 2019-05-24 DIAGNOSIS — R499 Unspecified voice and resonance disorder: Secondary | ICD-10-CM | POA: Diagnosis not present

## 2019-05-24 DIAGNOSIS — I2510 Atherosclerotic heart disease of native coronary artery without angina pectoris: Secondary | ICD-10-CM | POA: Diagnosis not present

## 2019-05-24 DIAGNOSIS — M6281 Muscle weakness (generalized): Secondary | ICD-10-CM | POA: Diagnosis not present

## 2019-05-24 DIAGNOSIS — G20 Parkinson's disease: Secondary | ICD-10-CM | POA: Diagnosis not present

## 2019-05-24 DIAGNOSIS — R279 Unspecified lack of coordination: Secondary | ICD-10-CM | POA: Diagnosis not present

## 2019-05-25 DIAGNOSIS — M6281 Muscle weakness (generalized): Secondary | ICD-10-CM | POA: Diagnosis not present

## 2019-05-25 DIAGNOSIS — R499 Unspecified voice and resonance disorder: Secondary | ICD-10-CM | POA: Diagnosis not present

## 2019-05-25 DIAGNOSIS — R279 Unspecified lack of coordination: Secondary | ICD-10-CM | POA: Diagnosis not present

## 2019-05-25 DIAGNOSIS — G20 Parkinson's disease: Secondary | ICD-10-CM | POA: Diagnosis not present

## 2019-05-25 DIAGNOSIS — I2510 Atherosclerotic heart disease of native coronary artery without angina pectoris: Secondary | ICD-10-CM | POA: Diagnosis not present

## 2019-05-25 DIAGNOSIS — R2689 Other abnormalities of gait and mobility: Secondary | ICD-10-CM | POA: Diagnosis not present

## 2019-05-25 DIAGNOSIS — R41841 Cognitive communication deficit: Secondary | ICD-10-CM | POA: Diagnosis not present

## 2019-05-26 DIAGNOSIS — R41841 Cognitive communication deficit: Secondary | ICD-10-CM | POA: Diagnosis not present

## 2019-05-26 DIAGNOSIS — R279 Unspecified lack of coordination: Secondary | ICD-10-CM | POA: Diagnosis not present

## 2019-05-26 DIAGNOSIS — I2510 Atherosclerotic heart disease of native coronary artery without angina pectoris: Secondary | ICD-10-CM | POA: Diagnosis not present

## 2019-05-26 DIAGNOSIS — R2689 Other abnormalities of gait and mobility: Secondary | ICD-10-CM | POA: Diagnosis not present

## 2019-05-26 DIAGNOSIS — R499 Unspecified voice and resonance disorder: Secondary | ICD-10-CM | POA: Diagnosis not present

## 2019-05-26 DIAGNOSIS — M6281 Muscle weakness (generalized): Secondary | ICD-10-CM | POA: Diagnosis not present

## 2019-05-26 DIAGNOSIS — G20 Parkinson's disease: Secondary | ICD-10-CM | POA: Diagnosis not present

## 2019-05-27 DIAGNOSIS — R2689 Other abnormalities of gait and mobility: Secondary | ICD-10-CM | POA: Diagnosis not present

## 2019-05-27 DIAGNOSIS — R279 Unspecified lack of coordination: Secondary | ICD-10-CM | POA: Diagnosis not present

## 2019-05-27 DIAGNOSIS — R499 Unspecified voice and resonance disorder: Secondary | ICD-10-CM | POA: Diagnosis not present

## 2019-05-27 DIAGNOSIS — M6281 Muscle weakness (generalized): Secondary | ICD-10-CM | POA: Diagnosis not present

## 2019-05-27 DIAGNOSIS — G20 Parkinson's disease: Secondary | ICD-10-CM | POA: Diagnosis not present

## 2019-05-27 DIAGNOSIS — I2510 Atherosclerotic heart disease of native coronary artery without angina pectoris: Secondary | ICD-10-CM | POA: Diagnosis not present

## 2019-05-27 DIAGNOSIS — R41841 Cognitive communication deficit: Secondary | ICD-10-CM | POA: Diagnosis not present

## 2019-05-30 DIAGNOSIS — M6281 Muscle weakness (generalized): Secondary | ICD-10-CM | POA: Diagnosis not present

## 2019-05-30 DIAGNOSIS — R2689 Other abnormalities of gait and mobility: Secondary | ICD-10-CM | POA: Diagnosis not present

## 2019-05-30 DIAGNOSIS — R499 Unspecified voice and resonance disorder: Secondary | ICD-10-CM | POA: Diagnosis not present

## 2019-05-30 DIAGNOSIS — I2510 Atherosclerotic heart disease of native coronary artery without angina pectoris: Secondary | ICD-10-CM | POA: Diagnosis not present

## 2019-05-30 DIAGNOSIS — R279 Unspecified lack of coordination: Secondary | ICD-10-CM | POA: Diagnosis not present

## 2019-05-30 DIAGNOSIS — G20 Parkinson's disease: Secondary | ICD-10-CM | POA: Diagnosis not present

## 2019-05-30 DIAGNOSIS — R41841 Cognitive communication deficit: Secondary | ICD-10-CM | POA: Diagnosis not present

## 2019-05-31 DIAGNOSIS — R499 Unspecified voice and resonance disorder: Secondary | ICD-10-CM | POA: Diagnosis not present

## 2019-05-31 DIAGNOSIS — I2510 Atherosclerotic heart disease of native coronary artery without angina pectoris: Secondary | ICD-10-CM | POA: Diagnosis not present

## 2019-05-31 DIAGNOSIS — R279 Unspecified lack of coordination: Secondary | ICD-10-CM | POA: Diagnosis not present

## 2019-05-31 DIAGNOSIS — R2689 Other abnormalities of gait and mobility: Secondary | ICD-10-CM | POA: Diagnosis not present

## 2019-05-31 DIAGNOSIS — R41841 Cognitive communication deficit: Secondary | ICD-10-CM | POA: Diagnosis not present

## 2019-05-31 DIAGNOSIS — G20 Parkinson's disease: Secondary | ICD-10-CM | POA: Diagnosis not present

## 2019-05-31 DIAGNOSIS — M6281 Muscle weakness (generalized): Secondary | ICD-10-CM | POA: Diagnosis not present

## 2019-06-01 DIAGNOSIS — R499 Unspecified voice and resonance disorder: Secondary | ICD-10-CM | POA: Diagnosis not present

## 2019-06-01 DIAGNOSIS — R279 Unspecified lack of coordination: Secondary | ICD-10-CM | POA: Diagnosis not present

## 2019-06-01 DIAGNOSIS — G20 Parkinson's disease: Secondary | ICD-10-CM | POA: Diagnosis not present

## 2019-06-01 DIAGNOSIS — R2689 Other abnormalities of gait and mobility: Secondary | ICD-10-CM | POA: Diagnosis not present

## 2019-06-01 DIAGNOSIS — I2510 Atherosclerotic heart disease of native coronary artery without angina pectoris: Secondary | ICD-10-CM | POA: Diagnosis not present

## 2019-06-01 DIAGNOSIS — R41841 Cognitive communication deficit: Secondary | ICD-10-CM | POA: Diagnosis not present

## 2019-06-01 DIAGNOSIS — M6281 Muscle weakness (generalized): Secondary | ICD-10-CM | POA: Diagnosis not present

## 2019-06-02 DIAGNOSIS — G20 Parkinson's disease: Secondary | ICD-10-CM | POA: Diagnosis not present

## 2019-06-02 DIAGNOSIS — I2510 Atherosclerotic heart disease of native coronary artery without angina pectoris: Secondary | ICD-10-CM | POA: Diagnosis not present

## 2019-06-02 DIAGNOSIS — R2689 Other abnormalities of gait and mobility: Secondary | ICD-10-CM | POA: Diagnosis not present

## 2019-06-02 DIAGNOSIS — R279 Unspecified lack of coordination: Secondary | ICD-10-CM | POA: Diagnosis not present

## 2019-06-02 DIAGNOSIS — R499 Unspecified voice and resonance disorder: Secondary | ICD-10-CM | POA: Diagnosis not present

## 2019-06-02 DIAGNOSIS — R41841 Cognitive communication deficit: Secondary | ICD-10-CM | POA: Diagnosis not present

## 2019-06-02 DIAGNOSIS — M6281 Muscle weakness (generalized): Secondary | ICD-10-CM | POA: Diagnosis not present

## 2019-06-03 DIAGNOSIS — M6281 Muscle weakness (generalized): Secondary | ICD-10-CM | POA: Diagnosis not present

## 2019-06-03 DIAGNOSIS — G20 Parkinson's disease: Secondary | ICD-10-CM | POA: Diagnosis not present

## 2019-06-03 DIAGNOSIS — R499 Unspecified voice and resonance disorder: Secondary | ICD-10-CM | POA: Diagnosis not present

## 2019-06-03 DIAGNOSIS — I2510 Atherosclerotic heart disease of native coronary artery without angina pectoris: Secondary | ICD-10-CM | POA: Diagnosis not present

## 2019-06-03 DIAGNOSIS — R41841 Cognitive communication deficit: Secondary | ICD-10-CM | POA: Diagnosis not present

## 2019-06-03 DIAGNOSIS — R279 Unspecified lack of coordination: Secondary | ICD-10-CM | POA: Diagnosis not present

## 2019-06-03 DIAGNOSIS — R2689 Other abnormalities of gait and mobility: Secondary | ICD-10-CM | POA: Diagnosis not present

## 2019-06-06 DIAGNOSIS — G20 Parkinson's disease: Secondary | ICD-10-CM | POA: Diagnosis not present

## 2019-06-06 DIAGNOSIS — R279 Unspecified lack of coordination: Secondary | ICD-10-CM | POA: Diagnosis not present

## 2019-06-06 DIAGNOSIS — R499 Unspecified voice and resonance disorder: Secondary | ICD-10-CM | POA: Diagnosis not present

## 2019-06-06 DIAGNOSIS — I2510 Atherosclerotic heart disease of native coronary artery without angina pectoris: Secondary | ICD-10-CM | POA: Diagnosis not present

## 2019-06-06 DIAGNOSIS — R41841 Cognitive communication deficit: Secondary | ICD-10-CM | POA: Diagnosis not present

## 2019-06-06 DIAGNOSIS — R2689 Other abnormalities of gait and mobility: Secondary | ICD-10-CM | POA: Diagnosis not present

## 2019-06-06 DIAGNOSIS — M6281 Muscle weakness (generalized): Secondary | ICD-10-CM | POA: Diagnosis not present

## 2019-06-07 DIAGNOSIS — R2689 Other abnormalities of gait and mobility: Secondary | ICD-10-CM | POA: Diagnosis not present

## 2019-06-07 DIAGNOSIS — R499 Unspecified voice and resonance disorder: Secondary | ICD-10-CM | POA: Diagnosis not present

## 2019-06-07 DIAGNOSIS — R41841 Cognitive communication deficit: Secondary | ICD-10-CM | POA: Diagnosis not present

## 2019-06-07 DIAGNOSIS — R279 Unspecified lack of coordination: Secondary | ICD-10-CM | POA: Diagnosis not present

## 2019-06-07 DIAGNOSIS — M6281 Muscle weakness (generalized): Secondary | ICD-10-CM | POA: Diagnosis not present

## 2019-06-07 DIAGNOSIS — G20 Parkinson's disease: Secondary | ICD-10-CM | POA: Diagnosis not present

## 2019-06-07 DIAGNOSIS — I2510 Atherosclerotic heart disease of native coronary artery without angina pectoris: Secondary | ICD-10-CM | POA: Diagnosis not present

## 2019-06-08 DIAGNOSIS — R41841 Cognitive communication deficit: Secondary | ICD-10-CM | POA: Diagnosis not present

## 2019-06-08 DIAGNOSIS — M6281 Muscle weakness (generalized): Secondary | ICD-10-CM | POA: Diagnosis not present

## 2019-06-08 DIAGNOSIS — R2689 Other abnormalities of gait and mobility: Secondary | ICD-10-CM | POA: Diagnosis not present

## 2019-06-08 DIAGNOSIS — R499 Unspecified voice and resonance disorder: Secondary | ICD-10-CM | POA: Diagnosis not present

## 2019-06-08 DIAGNOSIS — G20 Parkinson's disease: Secondary | ICD-10-CM | POA: Diagnosis not present

## 2019-06-08 DIAGNOSIS — I2510 Atherosclerotic heart disease of native coronary artery without angina pectoris: Secondary | ICD-10-CM | POA: Diagnosis not present

## 2019-06-08 DIAGNOSIS — R279 Unspecified lack of coordination: Secondary | ICD-10-CM | POA: Diagnosis not present

## 2019-06-09 DIAGNOSIS — M6281 Muscle weakness (generalized): Secondary | ICD-10-CM | POA: Diagnosis not present

## 2019-06-09 DIAGNOSIS — G20 Parkinson's disease: Secondary | ICD-10-CM | POA: Diagnosis not present

## 2019-06-09 DIAGNOSIS — R2689 Other abnormalities of gait and mobility: Secondary | ICD-10-CM | POA: Diagnosis not present

## 2019-06-09 DIAGNOSIS — R279 Unspecified lack of coordination: Secondary | ICD-10-CM | POA: Diagnosis not present

## 2019-06-09 DIAGNOSIS — R41841 Cognitive communication deficit: Secondary | ICD-10-CM | POA: Diagnosis not present

## 2019-06-09 DIAGNOSIS — I2510 Atherosclerotic heart disease of native coronary artery without angina pectoris: Secondary | ICD-10-CM | POA: Diagnosis not present

## 2019-06-09 DIAGNOSIS — R499 Unspecified voice and resonance disorder: Secondary | ICD-10-CM | POA: Diagnosis not present

## 2019-06-13 DIAGNOSIS — M6281 Muscle weakness (generalized): Secondary | ICD-10-CM | POA: Diagnosis not present

## 2019-06-13 DIAGNOSIS — I2510 Atherosclerotic heart disease of native coronary artery without angina pectoris: Secondary | ICD-10-CM | POA: Diagnosis not present

## 2019-06-13 DIAGNOSIS — G20 Parkinson's disease: Secondary | ICD-10-CM | POA: Diagnosis not present

## 2019-06-13 DIAGNOSIS — R279 Unspecified lack of coordination: Secondary | ICD-10-CM | POA: Diagnosis not present

## 2019-06-13 DIAGNOSIS — R41841 Cognitive communication deficit: Secondary | ICD-10-CM | POA: Diagnosis not present

## 2019-06-13 DIAGNOSIS — R499 Unspecified voice and resonance disorder: Secondary | ICD-10-CM | POA: Diagnosis not present

## 2019-06-13 DIAGNOSIS — R2689 Other abnormalities of gait and mobility: Secondary | ICD-10-CM | POA: Diagnosis not present

## 2019-06-14 DIAGNOSIS — M6281 Muscle weakness (generalized): Secondary | ICD-10-CM | POA: Diagnosis not present

## 2019-06-14 DIAGNOSIS — I2510 Atherosclerotic heart disease of native coronary artery without angina pectoris: Secondary | ICD-10-CM | POA: Diagnosis not present

## 2019-06-14 DIAGNOSIS — R279 Unspecified lack of coordination: Secondary | ICD-10-CM | POA: Diagnosis not present

## 2019-06-14 DIAGNOSIS — R2689 Other abnormalities of gait and mobility: Secondary | ICD-10-CM | POA: Diagnosis not present

## 2019-06-14 DIAGNOSIS — R499 Unspecified voice and resonance disorder: Secondary | ICD-10-CM | POA: Diagnosis not present

## 2019-06-14 DIAGNOSIS — R41841 Cognitive communication deficit: Secondary | ICD-10-CM | POA: Diagnosis not present

## 2019-06-14 DIAGNOSIS — G20 Parkinson's disease: Secondary | ICD-10-CM | POA: Diagnosis not present

## 2019-06-15 DIAGNOSIS — R279 Unspecified lack of coordination: Secondary | ICD-10-CM | POA: Diagnosis not present

## 2019-06-15 DIAGNOSIS — G20 Parkinson's disease: Secondary | ICD-10-CM | POA: Diagnosis not present

## 2019-06-15 DIAGNOSIS — R499 Unspecified voice and resonance disorder: Secondary | ICD-10-CM | POA: Diagnosis not present

## 2019-06-15 DIAGNOSIS — M6281 Muscle weakness (generalized): Secondary | ICD-10-CM | POA: Diagnosis not present

## 2019-06-15 DIAGNOSIS — R2689 Other abnormalities of gait and mobility: Secondary | ICD-10-CM | POA: Diagnosis not present

## 2019-06-15 DIAGNOSIS — I2510 Atherosclerotic heart disease of native coronary artery without angina pectoris: Secondary | ICD-10-CM | POA: Diagnosis not present

## 2019-06-15 DIAGNOSIS — R41841 Cognitive communication deficit: Secondary | ICD-10-CM | POA: Diagnosis not present

## 2019-06-16 DIAGNOSIS — R499 Unspecified voice and resonance disorder: Secondary | ICD-10-CM | POA: Diagnosis not present

## 2019-06-16 DIAGNOSIS — R2689 Other abnormalities of gait and mobility: Secondary | ICD-10-CM | POA: Diagnosis not present

## 2019-06-16 DIAGNOSIS — I2510 Atherosclerotic heart disease of native coronary artery without angina pectoris: Secondary | ICD-10-CM | POA: Diagnosis not present

## 2019-06-16 DIAGNOSIS — H8110 Benign paroxysmal vertigo, unspecified ear: Secondary | ICD-10-CM | POA: Insufficient documentation

## 2019-06-16 DIAGNOSIS — R41841 Cognitive communication deficit: Secondary | ICD-10-CM | POA: Diagnosis not present

## 2019-06-16 DIAGNOSIS — R279 Unspecified lack of coordination: Secondary | ICD-10-CM | POA: Diagnosis not present

## 2019-06-16 DIAGNOSIS — M6281 Muscle weakness (generalized): Secondary | ICD-10-CM | POA: Diagnosis not present

## 2019-06-16 DIAGNOSIS — G20 Parkinson's disease: Secondary | ICD-10-CM | POA: Diagnosis not present

## 2019-06-17 DIAGNOSIS — R41841 Cognitive communication deficit: Secondary | ICD-10-CM | POA: Diagnosis not present

## 2019-06-17 DIAGNOSIS — I2510 Atherosclerotic heart disease of native coronary artery without angina pectoris: Secondary | ICD-10-CM | POA: Diagnosis not present

## 2019-06-17 DIAGNOSIS — R499 Unspecified voice and resonance disorder: Secondary | ICD-10-CM | POA: Diagnosis not present

## 2019-06-17 DIAGNOSIS — R2689 Other abnormalities of gait and mobility: Secondary | ICD-10-CM | POA: Diagnosis not present

## 2019-06-17 DIAGNOSIS — R279 Unspecified lack of coordination: Secondary | ICD-10-CM | POA: Diagnosis not present

## 2019-06-17 DIAGNOSIS — R69 Illness, unspecified: Secondary | ICD-10-CM | POA: Diagnosis not present

## 2019-06-20 DIAGNOSIS — R69 Illness, unspecified: Secondary | ICD-10-CM | POA: Diagnosis not present

## 2019-06-20 DIAGNOSIS — R41841 Cognitive communication deficit: Secondary | ICD-10-CM | POA: Diagnosis not present

## 2019-06-20 DIAGNOSIS — R499 Unspecified voice and resonance disorder: Secondary | ICD-10-CM | POA: Diagnosis not present

## 2019-06-20 DIAGNOSIS — R279 Unspecified lack of coordination: Secondary | ICD-10-CM | POA: Diagnosis not present

## 2019-06-20 DIAGNOSIS — I2510 Atherosclerotic heart disease of native coronary artery without angina pectoris: Secondary | ICD-10-CM | POA: Diagnosis not present

## 2019-06-20 DIAGNOSIS — R2689 Other abnormalities of gait and mobility: Secondary | ICD-10-CM | POA: Diagnosis not present

## 2019-06-21 DIAGNOSIS — R279 Unspecified lack of coordination: Secondary | ICD-10-CM | POA: Diagnosis not present

## 2019-06-21 DIAGNOSIS — I2510 Atherosclerotic heart disease of native coronary artery without angina pectoris: Secondary | ICD-10-CM | POA: Diagnosis not present

## 2019-06-21 DIAGNOSIS — R41841 Cognitive communication deficit: Secondary | ICD-10-CM | POA: Diagnosis not present

## 2019-06-21 DIAGNOSIS — R2689 Other abnormalities of gait and mobility: Secondary | ICD-10-CM | POA: Diagnosis not present

## 2019-06-21 DIAGNOSIS — R499 Unspecified voice and resonance disorder: Secondary | ICD-10-CM | POA: Diagnosis not present

## 2019-06-22 DIAGNOSIS — R2689 Other abnormalities of gait and mobility: Secondary | ICD-10-CM | POA: Diagnosis not present

## 2019-06-22 DIAGNOSIS — I2510 Atherosclerotic heart disease of native coronary artery without angina pectoris: Secondary | ICD-10-CM | POA: Diagnosis not present

## 2019-06-22 DIAGNOSIS — R499 Unspecified voice and resonance disorder: Secondary | ICD-10-CM | POA: Diagnosis not present

## 2019-06-22 DIAGNOSIS — R279 Unspecified lack of coordination: Secondary | ICD-10-CM | POA: Diagnosis not present

## 2019-06-22 DIAGNOSIS — R41841 Cognitive communication deficit: Secondary | ICD-10-CM | POA: Diagnosis not present

## 2019-06-23 DIAGNOSIS — I2510 Atherosclerotic heart disease of native coronary artery without angina pectoris: Secondary | ICD-10-CM | POA: Diagnosis not present

## 2019-06-23 DIAGNOSIS — R41841 Cognitive communication deficit: Secondary | ICD-10-CM | POA: Diagnosis not present

## 2019-06-23 DIAGNOSIS — R279 Unspecified lack of coordination: Secondary | ICD-10-CM | POA: Diagnosis not present

## 2019-06-23 DIAGNOSIS — R499 Unspecified voice and resonance disorder: Secondary | ICD-10-CM | POA: Diagnosis not present

## 2019-06-23 DIAGNOSIS — R2689 Other abnormalities of gait and mobility: Secondary | ICD-10-CM | POA: Diagnosis not present

## 2019-06-24 DIAGNOSIS — R41841 Cognitive communication deficit: Secondary | ICD-10-CM | POA: Diagnosis not present

## 2019-06-24 DIAGNOSIS — R499 Unspecified voice and resonance disorder: Secondary | ICD-10-CM | POA: Diagnosis not present

## 2019-06-24 DIAGNOSIS — I2510 Atherosclerotic heart disease of native coronary artery without angina pectoris: Secondary | ICD-10-CM | POA: Diagnosis not present

## 2019-06-24 DIAGNOSIS — R2689 Other abnormalities of gait and mobility: Secondary | ICD-10-CM | POA: Diagnosis not present

## 2019-06-24 DIAGNOSIS — R279 Unspecified lack of coordination: Secondary | ICD-10-CM | POA: Diagnosis not present

## 2019-06-27 DIAGNOSIS — R2689 Other abnormalities of gait and mobility: Secondary | ICD-10-CM | POA: Diagnosis not present

## 2019-06-27 DIAGNOSIS — R41841 Cognitive communication deficit: Secondary | ICD-10-CM | POA: Diagnosis not present

## 2019-06-27 DIAGNOSIS — R499 Unspecified voice and resonance disorder: Secondary | ICD-10-CM | POA: Diagnosis not present

## 2019-06-27 DIAGNOSIS — R279 Unspecified lack of coordination: Secondary | ICD-10-CM | POA: Diagnosis not present

## 2019-06-27 DIAGNOSIS — I2510 Atherosclerotic heart disease of native coronary artery without angina pectoris: Secondary | ICD-10-CM | POA: Diagnosis not present

## 2019-06-28 DIAGNOSIS — R2689 Other abnormalities of gait and mobility: Secondary | ICD-10-CM | POA: Diagnosis not present

## 2019-06-28 DIAGNOSIS — R279 Unspecified lack of coordination: Secondary | ICD-10-CM | POA: Diagnosis not present

## 2019-06-28 DIAGNOSIS — R41841 Cognitive communication deficit: Secondary | ICD-10-CM | POA: Diagnosis not present

## 2019-06-28 DIAGNOSIS — R499 Unspecified voice and resonance disorder: Secondary | ICD-10-CM | POA: Diagnosis not present

## 2019-06-28 DIAGNOSIS — I2510 Atherosclerotic heart disease of native coronary artery without angina pectoris: Secondary | ICD-10-CM | POA: Diagnosis not present

## 2019-06-29 DIAGNOSIS — R41841 Cognitive communication deficit: Secondary | ICD-10-CM | POA: Diagnosis not present

## 2019-06-29 DIAGNOSIS — R2689 Other abnormalities of gait and mobility: Secondary | ICD-10-CM | POA: Diagnosis not present

## 2019-06-29 DIAGNOSIS — R279 Unspecified lack of coordination: Secondary | ICD-10-CM | POA: Diagnosis not present

## 2019-06-29 DIAGNOSIS — I2510 Atherosclerotic heart disease of native coronary artery without angina pectoris: Secondary | ICD-10-CM | POA: Diagnosis not present

## 2019-06-29 DIAGNOSIS — R499 Unspecified voice and resonance disorder: Secondary | ICD-10-CM | POA: Diagnosis not present

## 2019-06-30 DIAGNOSIS — R279 Unspecified lack of coordination: Secondary | ICD-10-CM | POA: Diagnosis not present

## 2019-06-30 DIAGNOSIS — I2510 Atherosclerotic heart disease of native coronary artery without angina pectoris: Secondary | ICD-10-CM | POA: Diagnosis not present

## 2019-06-30 DIAGNOSIS — R499 Unspecified voice and resonance disorder: Secondary | ICD-10-CM | POA: Diagnosis not present

## 2019-06-30 DIAGNOSIS — R41841 Cognitive communication deficit: Secondary | ICD-10-CM | POA: Diagnosis not present

## 2019-06-30 DIAGNOSIS — R2689 Other abnormalities of gait and mobility: Secondary | ICD-10-CM | POA: Diagnosis not present

## 2019-07-01 DIAGNOSIS — R41841 Cognitive communication deficit: Secondary | ICD-10-CM | POA: Diagnosis not present

## 2019-07-01 DIAGNOSIS — R499 Unspecified voice and resonance disorder: Secondary | ICD-10-CM | POA: Diagnosis not present

## 2019-07-01 DIAGNOSIS — R279 Unspecified lack of coordination: Secondary | ICD-10-CM | POA: Diagnosis not present

## 2019-07-01 DIAGNOSIS — I2510 Atherosclerotic heart disease of native coronary artery without angina pectoris: Secondary | ICD-10-CM | POA: Diagnosis not present

## 2019-07-01 DIAGNOSIS — R2689 Other abnormalities of gait and mobility: Secondary | ICD-10-CM | POA: Diagnosis not present

## 2019-07-04 DIAGNOSIS — R41841 Cognitive communication deficit: Secondary | ICD-10-CM | POA: Diagnosis not present

## 2019-07-04 DIAGNOSIS — R279 Unspecified lack of coordination: Secondary | ICD-10-CM | POA: Diagnosis not present

## 2019-07-04 DIAGNOSIS — R2689 Other abnormalities of gait and mobility: Secondary | ICD-10-CM | POA: Diagnosis not present

## 2019-07-04 DIAGNOSIS — R499 Unspecified voice and resonance disorder: Secondary | ICD-10-CM | POA: Diagnosis not present

## 2019-07-04 DIAGNOSIS — I2510 Atherosclerotic heart disease of native coronary artery without angina pectoris: Secondary | ICD-10-CM | POA: Diagnosis not present

## 2019-07-05 DIAGNOSIS — R279 Unspecified lack of coordination: Secondary | ICD-10-CM | POA: Diagnosis not present

## 2019-07-05 DIAGNOSIS — R41841 Cognitive communication deficit: Secondary | ICD-10-CM | POA: Diagnosis not present

## 2019-07-05 DIAGNOSIS — I2510 Atherosclerotic heart disease of native coronary artery without angina pectoris: Secondary | ICD-10-CM | POA: Diagnosis not present

## 2019-07-05 DIAGNOSIS — R499 Unspecified voice and resonance disorder: Secondary | ICD-10-CM | POA: Diagnosis not present

## 2019-07-05 DIAGNOSIS — R2689 Other abnormalities of gait and mobility: Secondary | ICD-10-CM | POA: Diagnosis not present

## 2019-07-06 DIAGNOSIS — R41841 Cognitive communication deficit: Secondary | ICD-10-CM | POA: Diagnosis not present

## 2019-07-06 DIAGNOSIS — I2510 Atherosclerotic heart disease of native coronary artery without angina pectoris: Secondary | ICD-10-CM | POA: Diagnosis not present

## 2019-07-06 DIAGNOSIS — R279 Unspecified lack of coordination: Secondary | ICD-10-CM | POA: Diagnosis not present

## 2019-07-06 DIAGNOSIS — R2689 Other abnormalities of gait and mobility: Secondary | ICD-10-CM | POA: Diagnosis not present

## 2019-07-06 DIAGNOSIS — R499 Unspecified voice and resonance disorder: Secondary | ICD-10-CM | POA: Diagnosis not present

## 2019-07-07 DIAGNOSIS — R499 Unspecified voice and resonance disorder: Secondary | ICD-10-CM | POA: Diagnosis not present

## 2019-07-07 DIAGNOSIS — I2510 Atherosclerotic heart disease of native coronary artery without angina pectoris: Secondary | ICD-10-CM | POA: Diagnosis not present

## 2019-07-07 DIAGNOSIS — R2689 Other abnormalities of gait and mobility: Secondary | ICD-10-CM | POA: Diagnosis not present

## 2019-07-07 DIAGNOSIS — R41841 Cognitive communication deficit: Secondary | ICD-10-CM | POA: Diagnosis not present

## 2019-07-07 DIAGNOSIS — R279 Unspecified lack of coordination: Secondary | ICD-10-CM | POA: Diagnosis not present

## 2019-07-08 DIAGNOSIS — R279 Unspecified lack of coordination: Secondary | ICD-10-CM | POA: Diagnosis not present

## 2019-07-08 DIAGNOSIS — R499 Unspecified voice and resonance disorder: Secondary | ICD-10-CM | POA: Diagnosis not present

## 2019-07-08 DIAGNOSIS — I2510 Atherosclerotic heart disease of native coronary artery without angina pectoris: Secondary | ICD-10-CM | POA: Diagnosis not present

## 2019-07-08 DIAGNOSIS — R2689 Other abnormalities of gait and mobility: Secondary | ICD-10-CM | POA: Diagnosis not present

## 2019-07-08 DIAGNOSIS — R41841 Cognitive communication deficit: Secondary | ICD-10-CM | POA: Diagnosis not present

## 2019-07-11 DIAGNOSIS — R41841 Cognitive communication deficit: Secondary | ICD-10-CM | POA: Diagnosis not present

## 2019-07-11 DIAGNOSIS — R2689 Other abnormalities of gait and mobility: Secondary | ICD-10-CM | POA: Diagnosis not present

## 2019-07-11 DIAGNOSIS — R279 Unspecified lack of coordination: Secondary | ICD-10-CM | POA: Diagnosis not present

## 2019-07-11 DIAGNOSIS — R499 Unspecified voice and resonance disorder: Secondary | ICD-10-CM | POA: Diagnosis not present

## 2019-07-11 DIAGNOSIS — I2510 Atherosclerotic heart disease of native coronary artery without angina pectoris: Secondary | ICD-10-CM | POA: Diagnosis not present

## 2019-07-12 DIAGNOSIS — R41841 Cognitive communication deficit: Secondary | ICD-10-CM | POA: Diagnosis not present

## 2019-07-12 DIAGNOSIS — R279 Unspecified lack of coordination: Secondary | ICD-10-CM | POA: Diagnosis not present

## 2019-07-12 DIAGNOSIS — R2689 Other abnormalities of gait and mobility: Secondary | ICD-10-CM | POA: Diagnosis not present

## 2019-07-12 DIAGNOSIS — R499 Unspecified voice and resonance disorder: Secondary | ICD-10-CM | POA: Diagnosis not present

## 2019-07-12 DIAGNOSIS — I2510 Atherosclerotic heart disease of native coronary artery without angina pectoris: Secondary | ICD-10-CM | POA: Diagnosis not present

## 2019-07-13 DIAGNOSIS — R2689 Other abnormalities of gait and mobility: Secondary | ICD-10-CM | POA: Diagnosis not present

## 2019-07-13 DIAGNOSIS — R499 Unspecified voice and resonance disorder: Secondary | ICD-10-CM | POA: Diagnosis not present

## 2019-07-13 DIAGNOSIS — R279 Unspecified lack of coordination: Secondary | ICD-10-CM | POA: Diagnosis not present

## 2019-07-13 DIAGNOSIS — I2510 Atherosclerotic heart disease of native coronary artery without angina pectoris: Secondary | ICD-10-CM | POA: Diagnosis not present

## 2019-07-13 DIAGNOSIS — R41841 Cognitive communication deficit: Secondary | ICD-10-CM | POA: Diagnosis not present

## 2019-07-14 DIAGNOSIS — R279 Unspecified lack of coordination: Secondary | ICD-10-CM | POA: Diagnosis not present

## 2019-07-14 DIAGNOSIS — R2689 Other abnormalities of gait and mobility: Secondary | ICD-10-CM | POA: Diagnosis not present

## 2019-07-14 DIAGNOSIS — I2510 Atherosclerotic heart disease of native coronary artery without angina pectoris: Secondary | ICD-10-CM | POA: Diagnosis not present

## 2019-07-14 DIAGNOSIS — R499 Unspecified voice and resonance disorder: Secondary | ICD-10-CM | POA: Diagnosis not present

## 2019-07-14 DIAGNOSIS — R41841 Cognitive communication deficit: Secondary | ICD-10-CM | POA: Diagnosis not present

## 2019-07-15 DIAGNOSIS — R279 Unspecified lack of coordination: Secondary | ICD-10-CM | POA: Diagnosis not present

## 2019-07-15 DIAGNOSIS — R41841 Cognitive communication deficit: Secondary | ICD-10-CM | POA: Diagnosis not present

## 2019-07-15 DIAGNOSIS — R499 Unspecified voice and resonance disorder: Secondary | ICD-10-CM | POA: Diagnosis not present

## 2019-07-15 DIAGNOSIS — I2510 Atherosclerotic heart disease of native coronary artery without angina pectoris: Secondary | ICD-10-CM | POA: Diagnosis not present

## 2019-07-15 DIAGNOSIS — R2689 Other abnormalities of gait and mobility: Secondary | ICD-10-CM | POA: Diagnosis not present

## 2019-07-19 DIAGNOSIS — I83893 Varicose veins of bilateral lower extremities with other complications: Secondary | ICD-10-CM | POA: Diagnosis not present

## 2019-07-19 DIAGNOSIS — M48061 Spinal stenosis, lumbar region without neurogenic claudication: Secondary | ICD-10-CM | POA: Diagnosis not present

## 2019-07-19 DIAGNOSIS — I10 Essential (primary) hypertension: Secondary | ICD-10-CM | POA: Diagnosis not present

## 2019-07-19 DIAGNOSIS — R2689 Other abnormalities of gait and mobility: Secondary | ICD-10-CM | POA: Diagnosis not present

## 2019-07-19 DIAGNOSIS — M5136 Other intervertebral disc degeneration, lumbar region: Secondary | ICD-10-CM | POA: Diagnosis not present

## 2019-07-19 DIAGNOSIS — R296 Repeated falls: Secondary | ICD-10-CM | POA: Diagnosis not present

## 2019-07-19 DIAGNOSIS — F0280 Dementia in other diseases classified elsewhere without behavioral disturbance: Secondary | ICD-10-CM | POA: Diagnosis not present

## 2019-07-19 DIAGNOSIS — G20 Parkinson's disease: Secondary | ICD-10-CM | POA: Diagnosis not present

## 2019-07-19 DIAGNOSIS — M6281 Muscle weakness (generalized): Secondary | ICD-10-CM | POA: Diagnosis not present

## 2019-07-20 DIAGNOSIS — G20 Parkinson's disease: Secondary | ICD-10-CM | POA: Diagnosis not present

## 2019-07-20 DIAGNOSIS — I83893 Varicose veins of bilateral lower extremities with other complications: Secondary | ICD-10-CM | POA: Diagnosis not present

## 2019-07-20 DIAGNOSIS — I10 Essential (primary) hypertension: Secondary | ICD-10-CM | POA: Diagnosis not present

## 2019-07-20 DIAGNOSIS — R296 Repeated falls: Secondary | ICD-10-CM | POA: Diagnosis not present

## 2019-07-20 DIAGNOSIS — F0280 Dementia in other diseases classified elsewhere without behavioral disturbance: Secondary | ICD-10-CM | POA: Diagnosis not present

## 2019-07-20 DIAGNOSIS — M48061 Spinal stenosis, lumbar region without neurogenic claudication: Secondary | ICD-10-CM | POA: Diagnosis not present

## 2019-07-20 DIAGNOSIS — M5136 Other intervertebral disc degeneration, lumbar region: Secondary | ICD-10-CM | POA: Diagnosis not present

## 2019-07-20 DIAGNOSIS — R2689 Other abnormalities of gait and mobility: Secondary | ICD-10-CM | POA: Diagnosis not present

## 2019-07-20 DIAGNOSIS — M6281 Muscle weakness (generalized): Secondary | ICD-10-CM | POA: Diagnosis not present

## 2019-07-21 DIAGNOSIS — I83893 Varicose veins of bilateral lower extremities with other complications: Secondary | ICD-10-CM | POA: Diagnosis not present

## 2019-07-21 DIAGNOSIS — M5136 Other intervertebral disc degeneration, lumbar region: Secondary | ICD-10-CM | POA: Diagnosis not present

## 2019-07-21 DIAGNOSIS — M6281 Muscle weakness (generalized): Secondary | ICD-10-CM | POA: Diagnosis not present

## 2019-07-21 DIAGNOSIS — I10 Essential (primary) hypertension: Secondary | ICD-10-CM | POA: Diagnosis not present

## 2019-07-21 DIAGNOSIS — F0280 Dementia in other diseases classified elsewhere without behavioral disturbance: Secondary | ICD-10-CM | POA: Diagnosis not present

## 2019-07-21 DIAGNOSIS — M48061 Spinal stenosis, lumbar region without neurogenic claudication: Secondary | ICD-10-CM | POA: Diagnosis not present

## 2019-07-21 DIAGNOSIS — R2689 Other abnormalities of gait and mobility: Secondary | ICD-10-CM | POA: Diagnosis not present

## 2019-07-21 DIAGNOSIS — R296 Repeated falls: Secondary | ICD-10-CM | POA: Diagnosis not present

## 2019-07-21 DIAGNOSIS — G20 Parkinson's disease: Secondary | ICD-10-CM | POA: Diagnosis not present

## 2019-07-26 DIAGNOSIS — I83893 Varicose veins of bilateral lower extremities with other complications: Secondary | ICD-10-CM | POA: Diagnosis not present

## 2019-07-26 DIAGNOSIS — G20 Parkinson's disease: Secondary | ICD-10-CM | POA: Diagnosis not present

## 2019-07-26 DIAGNOSIS — I10 Essential (primary) hypertension: Secondary | ICD-10-CM | POA: Diagnosis not present

## 2019-07-26 DIAGNOSIS — M5136 Other intervertebral disc degeneration, lumbar region: Secondary | ICD-10-CM | POA: Diagnosis not present

## 2019-07-26 DIAGNOSIS — R296 Repeated falls: Secondary | ICD-10-CM | POA: Diagnosis not present

## 2019-07-26 DIAGNOSIS — M6281 Muscle weakness (generalized): Secondary | ICD-10-CM | POA: Diagnosis not present

## 2019-07-26 DIAGNOSIS — M48061 Spinal stenosis, lumbar region without neurogenic claudication: Secondary | ICD-10-CM | POA: Diagnosis not present

## 2019-07-26 DIAGNOSIS — F0280 Dementia in other diseases classified elsewhere without behavioral disturbance: Secondary | ICD-10-CM | POA: Diagnosis not present

## 2019-07-26 DIAGNOSIS — R2689 Other abnormalities of gait and mobility: Secondary | ICD-10-CM | POA: Diagnosis not present

## 2019-07-27 DIAGNOSIS — F0280 Dementia in other diseases classified elsewhere without behavioral disturbance: Secondary | ICD-10-CM | POA: Diagnosis not present

## 2019-07-27 DIAGNOSIS — M5136 Other intervertebral disc degeneration, lumbar region: Secondary | ICD-10-CM | POA: Diagnosis not present

## 2019-07-27 DIAGNOSIS — R2689 Other abnormalities of gait and mobility: Secondary | ICD-10-CM | POA: Diagnosis not present

## 2019-07-27 DIAGNOSIS — G20 Parkinson's disease: Secondary | ICD-10-CM | POA: Diagnosis not present

## 2019-07-27 DIAGNOSIS — I10 Essential (primary) hypertension: Secondary | ICD-10-CM | POA: Diagnosis not present

## 2019-07-27 DIAGNOSIS — R296 Repeated falls: Secondary | ICD-10-CM | POA: Diagnosis not present

## 2019-07-27 DIAGNOSIS — I83893 Varicose veins of bilateral lower extremities with other complications: Secondary | ICD-10-CM | POA: Diagnosis not present

## 2019-07-27 DIAGNOSIS — M6281 Muscle weakness (generalized): Secondary | ICD-10-CM | POA: Diagnosis not present

## 2019-07-27 DIAGNOSIS — M48061 Spinal stenosis, lumbar region without neurogenic claudication: Secondary | ICD-10-CM | POA: Diagnosis not present

## 2019-07-29 DIAGNOSIS — R2689 Other abnormalities of gait and mobility: Secondary | ICD-10-CM | POA: Diagnosis not present

## 2019-07-29 DIAGNOSIS — M5136 Other intervertebral disc degeneration, lumbar region: Secondary | ICD-10-CM | POA: Diagnosis not present

## 2019-07-29 DIAGNOSIS — I10 Essential (primary) hypertension: Secondary | ICD-10-CM | POA: Diagnosis not present

## 2019-07-29 DIAGNOSIS — I83893 Varicose veins of bilateral lower extremities with other complications: Secondary | ICD-10-CM | POA: Diagnosis not present

## 2019-07-29 DIAGNOSIS — G20 Parkinson's disease: Secondary | ICD-10-CM | POA: Diagnosis not present

## 2019-07-29 DIAGNOSIS — R296 Repeated falls: Secondary | ICD-10-CM | POA: Diagnosis not present

## 2019-07-29 DIAGNOSIS — M6281 Muscle weakness (generalized): Secondary | ICD-10-CM | POA: Diagnosis not present

## 2019-07-29 DIAGNOSIS — F0280 Dementia in other diseases classified elsewhere without behavioral disturbance: Secondary | ICD-10-CM | POA: Diagnosis not present

## 2019-07-29 DIAGNOSIS — M48061 Spinal stenosis, lumbar region without neurogenic claudication: Secondary | ICD-10-CM | POA: Diagnosis not present

## 2019-08-02 DIAGNOSIS — G20 Parkinson's disease: Secondary | ICD-10-CM | POA: Diagnosis not present

## 2019-08-02 DIAGNOSIS — M5136 Other intervertebral disc degeneration, lumbar region: Secondary | ICD-10-CM | POA: Diagnosis not present

## 2019-08-02 DIAGNOSIS — R2689 Other abnormalities of gait and mobility: Secondary | ICD-10-CM | POA: Diagnosis not present

## 2019-08-02 DIAGNOSIS — I10 Essential (primary) hypertension: Secondary | ICD-10-CM | POA: Diagnosis not present

## 2019-08-02 DIAGNOSIS — M6281 Muscle weakness (generalized): Secondary | ICD-10-CM | POA: Diagnosis not present

## 2019-08-02 DIAGNOSIS — R296 Repeated falls: Secondary | ICD-10-CM | POA: Diagnosis not present

## 2019-08-02 DIAGNOSIS — I83893 Varicose veins of bilateral lower extremities with other complications: Secondary | ICD-10-CM | POA: Diagnosis not present

## 2019-08-02 DIAGNOSIS — M48061 Spinal stenosis, lumbar region without neurogenic claudication: Secondary | ICD-10-CM | POA: Diagnosis not present

## 2019-08-02 DIAGNOSIS — F0280 Dementia in other diseases classified elsewhere without behavioral disturbance: Secondary | ICD-10-CM | POA: Diagnosis not present

## 2019-08-03 DIAGNOSIS — M5136 Other intervertebral disc degeneration, lumbar region: Secondary | ICD-10-CM | POA: Diagnosis not present

## 2019-08-03 DIAGNOSIS — R2689 Other abnormalities of gait and mobility: Secondary | ICD-10-CM | POA: Diagnosis not present

## 2019-08-03 DIAGNOSIS — M6281 Muscle weakness (generalized): Secondary | ICD-10-CM | POA: Diagnosis not present

## 2019-08-03 DIAGNOSIS — R296 Repeated falls: Secondary | ICD-10-CM | POA: Diagnosis not present

## 2019-08-03 DIAGNOSIS — I83893 Varicose veins of bilateral lower extremities with other complications: Secondary | ICD-10-CM | POA: Diagnosis not present

## 2019-08-03 DIAGNOSIS — M48061 Spinal stenosis, lumbar region without neurogenic claudication: Secondary | ICD-10-CM | POA: Diagnosis not present

## 2019-08-03 DIAGNOSIS — F0280 Dementia in other diseases classified elsewhere without behavioral disturbance: Secondary | ICD-10-CM | POA: Diagnosis not present

## 2019-08-03 DIAGNOSIS — I10 Essential (primary) hypertension: Secondary | ICD-10-CM | POA: Diagnosis not present

## 2019-08-03 DIAGNOSIS — G20 Parkinson's disease: Secondary | ICD-10-CM | POA: Diagnosis not present

## 2019-08-04 ENCOUNTER — Encounter
Admission: RE | Admit: 2019-08-04 | Discharge: 2019-08-04 | Disposition: A | Discharge status: Home / Self Care | Payer: Medicare HMO | Source: Ambulatory Visit | Attending: Internal Medicine | Admitting: Internal Medicine

## 2019-08-05 DIAGNOSIS — F0280 Dementia in other diseases classified elsewhere without behavioral disturbance: Secondary | ICD-10-CM | POA: Diagnosis not present

## 2019-08-05 DIAGNOSIS — M6281 Muscle weakness (generalized): Secondary | ICD-10-CM | POA: Diagnosis not present

## 2019-08-05 DIAGNOSIS — G20 Parkinson's disease: Secondary | ICD-10-CM | POA: Diagnosis not present

## 2019-08-05 DIAGNOSIS — M48061 Spinal stenosis, lumbar region without neurogenic claudication: Secondary | ICD-10-CM | POA: Diagnosis not present

## 2019-08-05 DIAGNOSIS — R2689 Other abnormalities of gait and mobility: Secondary | ICD-10-CM | POA: Diagnosis not present

## 2019-08-05 DIAGNOSIS — R296 Repeated falls: Secondary | ICD-10-CM | POA: Diagnosis not present

## 2019-08-05 DIAGNOSIS — I10 Essential (primary) hypertension: Secondary | ICD-10-CM | POA: Diagnosis not present

## 2019-08-05 DIAGNOSIS — I83893 Varicose veins of bilateral lower extremities with other complications: Secondary | ICD-10-CM | POA: Diagnosis not present

## 2019-08-05 DIAGNOSIS — M5136 Other intervertebral disc degeneration, lumbar region: Secondary | ICD-10-CM | POA: Diagnosis not present

## 2019-08-11 DIAGNOSIS — G20 Parkinson's disease: Secondary | ICD-10-CM | POA: Diagnosis not present

## 2019-08-11 DIAGNOSIS — R296 Repeated falls: Secondary | ICD-10-CM | POA: Diagnosis not present

## 2019-08-11 DIAGNOSIS — M6281 Muscle weakness (generalized): Secondary | ICD-10-CM | POA: Diagnosis not present

## 2019-08-11 DIAGNOSIS — I10 Essential (primary) hypertension: Secondary | ICD-10-CM | POA: Diagnosis not present

## 2019-08-11 DIAGNOSIS — M5136 Other intervertebral disc degeneration, lumbar region: Secondary | ICD-10-CM | POA: Diagnosis not present

## 2019-08-11 DIAGNOSIS — I83893 Varicose veins of bilateral lower extremities with other complications: Secondary | ICD-10-CM | POA: Diagnosis not present

## 2019-08-11 DIAGNOSIS — F0280 Dementia in other diseases classified elsewhere without behavioral disturbance: Secondary | ICD-10-CM | POA: Diagnosis not present

## 2019-08-11 DIAGNOSIS — R2689 Other abnormalities of gait and mobility: Secondary | ICD-10-CM | POA: Diagnosis not present

## 2019-08-11 DIAGNOSIS — M48061 Spinal stenosis, lumbar region without neurogenic claudication: Secondary | ICD-10-CM | POA: Diagnosis not present

## 2019-08-16 DIAGNOSIS — R296 Repeated falls: Secondary | ICD-10-CM | POA: Diagnosis not present

## 2019-08-16 DIAGNOSIS — M6281 Muscle weakness (generalized): Secondary | ICD-10-CM | POA: Diagnosis not present

## 2019-08-16 DIAGNOSIS — M48061 Spinal stenosis, lumbar region without neurogenic claudication: Secondary | ICD-10-CM | POA: Diagnosis not present

## 2019-08-16 DIAGNOSIS — R2689 Other abnormalities of gait and mobility: Secondary | ICD-10-CM | POA: Diagnosis not present

## 2019-08-16 DIAGNOSIS — F0280 Dementia in other diseases classified elsewhere without behavioral disturbance: Secondary | ICD-10-CM | POA: Diagnosis not present

## 2019-08-16 DIAGNOSIS — I83893 Varicose veins of bilateral lower extremities with other complications: Secondary | ICD-10-CM | POA: Diagnosis not present

## 2019-08-16 DIAGNOSIS — M5136 Other intervertebral disc degeneration, lumbar region: Secondary | ICD-10-CM | POA: Diagnosis not present

## 2019-08-16 DIAGNOSIS — G20 Parkinson's disease: Secondary | ICD-10-CM | POA: Diagnosis not present

## 2019-08-16 DIAGNOSIS — I10 Essential (primary) hypertension: Secondary | ICD-10-CM | POA: Diagnosis not present

## 2019-08-17 DIAGNOSIS — I83893 Varicose veins of bilateral lower extremities with other complications: Secondary | ICD-10-CM | POA: Diagnosis not present

## 2019-08-17 DIAGNOSIS — F0280 Dementia in other diseases classified elsewhere without behavioral disturbance: Secondary | ICD-10-CM | POA: Diagnosis not present

## 2019-08-17 DIAGNOSIS — M6281 Muscle weakness (generalized): Secondary | ICD-10-CM | POA: Diagnosis not present

## 2019-08-17 DIAGNOSIS — I10 Essential (primary) hypertension: Secondary | ICD-10-CM | POA: Diagnosis not present

## 2019-08-17 DIAGNOSIS — G20 Parkinson's disease: Secondary | ICD-10-CM | POA: Diagnosis not present

## 2019-08-17 DIAGNOSIS — M48061 Spinal stenosis, lumbar region without neurogenic claudication: Secondary | ICD-10-CM | POA: Diagnosis not present

## 2019-08-17 DIAGNOSIS — R296 Repeated falls: Secondary | ICD-10-CM | POA: Diagnosis not present

## 2019-08-17 DIAGNOSIS — M5136 Other intervertebral disc degeneration, lumbar region: Secondary | ICD-10-CM | POA: Diagnosis not present

## 2019-08-17 DIAGNOSIS — R2689 Other abnormalities of gait and mobility: Secondary | ICD-10-CM | POA: Diagnosis not present

## 2019-08-18 DIAGNOSIS — G20 Parkinson's disease: Secondary | ICD-10-CM | POA: Diagnosis not present

## 2019-08-18 DIAGNOSIS — M5136 Other intervertebral disc degeneration, lumbar region: Secondary | ICD-10-CM | POA: Diagnosis not present

## 2019-08-18 DIAGNOSIS — R296 Repeated falls: Secondary | ICD-10-CM | POA: Diagnosis not present

## 2019-08-18 DIAGNOSIS — M48061 Spinal stenosis, lumbar region without neurogenic claudication: Secondary | ICD-10-CM | POA: Diagnosis not present

## 2019-08-18 DIAGNOSIS — I10 Essential (primary) hypertension: Secondary | ICD-10-CM | POA: Diagnosis not present

## 2019-08-18 DIAGNOSIS — I83893 Varicose veins of bilateral lower extremities with other complications: Secondary | ICD-10-CM | POA: Diagnosis not present

## 2019-08-18 DIAGNOSIS — F0280 Dementia in other diseases classified elsewhere without behavioral disturbance: Secondary | ICD-10-CM | POA: Diagnosis not present

## 2019-08-18 DIAGNOSIS — M6281 Muscle weakness (generalized): Secondary | ICD-10-CM | POA: Diagnosis not present

## 2019-08-18 DIAGNOSIS — R2689 Other abnormalities of gait and mobility: Secondary | ICD-10-CM | POA: Diagnosis not present

## 2019-08-22 DIAGNOSIS — R296 Repeated falls: Secondary | ICD-10-CM | POA: Diagnosis not present

## 2019-08-22 DIAGNOSIS — M5136 Other intervertebral disc degeneration, lumbar region: Secondary | ICD-10-CM | POA: Diagnosis not present

## 2019-08-22 DIAGNOSIS — M48061 Spinal stenosis, lumbar region without neurogenic claudication: Secondary | ICD-10-CM | POA: Diagnosis not present

## 2019-08-22 DIAGNOSIS — R2689 Other abnormalities of gait and mobility: Secondary | ICD-10-CM | POA: Diagnosis not present

## 2019-08-22 DIAGNOSIS — M6281 Muscle weakness (generalized): Secondary | ICD-10-CM | POA: Diagnosis not present

## 2019-08-22 DIAGNOSIS — I83893 Varicose veins of bilateral lower extremities with other complications: Secondary | ICD-10-CM | POA: Diagnosis not present

## 2019-08-22 DIAGNOSIS — G20 Parkinson's disease: Secondary | ICD-10-CM | POA: Diagnosis not present

## 2019-08-22 DIAGNOSIS — F0280 Dementia in other diseases classified elsewhere without behavioral disturbance: Secondary | ICD-10-CM | POA: Diagnosis not present

## 2019-08-22 DIAGNOSIS — I10 Essential (primary) hypertension: Secondary | ICD-10-CM | POA: Diagnosis not present

## 2019-08-29 DIAGNOSIS — K0510 Chronic gingivitis, plaque induced: Secondary | ICD-10-CM | POA: Insufficient documentation

## 2019-10-07 DIAGNOSIS — G20 Parkinson's disease: Secondary | ICD-10-CM | POA: Diagnosis not present

## 2019-10-07 DIAGNOSIS — F0280 Dementia in other diseases classified elsewhere without behavioral disturbance: Secondary | ICD-10-CM | POA: Diagnosis not present

## 2019-10-07 DIAGNOSIS — M48061 Spinal stenosis, lumbar region without neurogenic claudication: Secondary | ICD-10-CM | POA: Diagnosis not present

## 2019-10-07 DIAGNOSIS — R296 Repeated falls: Secondary | ICD-10-CM | POA: Diagnosis not present

## 2019-10-07 DIAGNOSIS — M6281 Muscle weakness (generalized): Secondary | ICD-10-CM | POA: Diagnosis not present

## 2019-10-07 DIAGNOSIS — I83893 Varicose veins of bilateral lower extremities with other complications: Secondary | ICD-10-CM | POA: Diagnosis not present

## 2019-10-07 DIAGNOSIS — I10 Essential (primary) hypertension: Secondary | ICD-10-CM | POA: Diagnosis not present

## 2019-10-07 DIAGNOSIS — M5136 Other intervertebral disc degeneration, lumbar region: Secondary | ICD-10-CM | POA: Diagnosis not present

## 2019-10-07 DIAGNOSIS — R2689 Other abnormalities of gait and mobility: Secondary | ICD-10-CM | POA: Diagnosis not present

## 2019-10-11 ENCOUNTER — Other Ambulatory Visit
Admission: RE | Admit: 2019-10-11 | Discharge: 2019-10-11 | Disposition: A | Discharge status: Home / Self Care | Payer: Medicare HMO | Source: Skilled Nursing Facility | Attending: Internal Medicine | Admitting: Internal Medicine

## 2019-10-11 DIAGNOSIS — M5136 Other intervertebral disc degeneration, lumbar region: Secondary | ICD-10-CM | POA: Diagnosis not present

## 2019-10-11 DIAGNOSIS — R2689 Other abnormalities of gait and mobility: Secondary | ICD-10-CM | POA: Diagnosis not present

## 2019-10-11 DIAGNOSIS — F0280 Dementia in other diseases classified elsewhere without behavioral disturbance: Secondary | ICD-10-CM | POA: Diagnosis not present

## 2019-10-11 DIAGNOSIS — M6281 Muscle weakness (generalized): Secondary | ICD-10-CM | POA: Diagnosis not present

## 2019-10-11 DIAGNOSIS — R05 Cough: Secondary | ICD-10-CM | POA: Diagnosis present

## 2019-10-11 DIAGNOSIS — G20 Parkinson's disease: Secondary | ICD-10-CM | POA: Diagnosis not present

## 2019-10-11 DIAGNOSIS — I10 Essential (primary) hypertension: Secondary | ICD-10-CM | POA: Diagnosis not present

## 2019-10-11 DIAGNOSIS — I83893 Varicose veins of bilateral lower extremities with other complications: Secondary | ICD-10-CM | POA: Diagnosis not present

## 2019-10-11 DIAGNOSIS — R296 Repeated falls: Secondary | ICD-10-CM | POA: Diagnosis not present

## 2019-10-11 DIAGNOSIS — M48061 Spinal stenosis, lumbar region without neurogenic claudication: Secondary | ICD-10-CM | POA: Diagnosis not present

## 2019-10-11 LAB — INFLUENZA PANEL BY PCR (TYPE A & B)
Influenza A By PCR: NEGATIVE
Influenza B By PCR: NEGATIVE

## 2019-10-12 DIAGNOSIS — I83893 Varicose veins of bilateral lower extremities with other complications: Secondary | ICD-10-CM | POA: Diagnosis not present

## 2019-10-12 DIAGNOSIS — R296 Repeated falls: Secondary | ICD-10-CM | POA: Diagnosis not present

## 2019-10-12 DIAGNOSIS — F0280 Dementia in other diseases classified elsewhere without behavioral disturbance: Secondary | ICD-10-CM | POA: Diagnosis not present

## 2019-10-12 DIAGNOSIS — M5136 Other intervertebral disc degeneration, lumbar region: Secondary | ICD-10-CM | POA: Diagnosis not present

## 2019-10-12 DIAGNOSIS — R2689 Other abnormalities of gait and mobility: Secondary | ICD-10-CM | POA: Diagnosis not present

## 2019-10-12 DIAGNOSIS — M48061 Spinal stenosis, lumbar region without neurogenic claudication: Secondary | ICD-10-CM | POA: Diagnosis not present

## 2019-10-12 DIAGNOSIS — M6281 Muscle weakness (generalized): Secondary | ICD-10-CM | POA: Diagnosis not present

## 2019-10-12 DIAGNOSIS — G20 Parkinson's disease: Secondary | ICD-10-CM | POA: Diagnosis not present

## 2019-10-12 DIAGNOSIS — I10 Essential (primary) hypertension: Secondary | ICD-10-CM | POA: Diagnosis not present

## 2019-10-13 DIAGNOSIS — M48061 Spinal stenosis, lumbar region without neurogenic claudication: Secondary | ICD-10-CM | POA: Diagnosis not present

## 2019-10-13 DIAGNOSIS — M6281 Muscle weakness (generalized): Secondary | ICD-10-CM | POA: Diagnosis not present

## 2019-10-13 DIAGNOSIS — F0280 Dementia in other diseases classified elsewhere without behavioral disturbance: Secondary | ICD-10-CM | POA: Diagnosis not present

## 2019-10-13 DIAGNOSIS — I83893 Varicose veins of bilateral lower extremities with other complications: Secondary | ICD-10-CM | POA: Diagnosis not present

## 2019-10-13 DIAGNOSIS — M5136 Other intervertebral disc degeneration, lumbar region: Secondary | ICD-10-CM | POA: Diagnosis not present

## 2019-10-13 DIAGNOSIS — I10 Essential (primary) hypertension: Secondary | ICD-10-CM | POA: Diagnosis not present

## 2019-10-13 DIAGNOSIS — R296 Repeated falls: Secondary | ICD-10-CM | POA: Diagnosis not present

## 2019-10-13 DIAGNOSIS — G20 Parkinson's disease: Secondary | ICD-10-CM | POA: Diagnosis not present

## 2019-10-13 DIAGNOSIS — R2689 Other abnormalities of gait and mobility: Secondary | ICD-10-CM | POA: Diagnosis not present

## 2019-10-14 DIAGNOSIS — M5136 Other intervertebral disc degeneration, lumbar region: Secondary | ICD-10-CM | POA: Diagnosis not present

## 2019-10-14 DIAGNOSIS — G20 Parkinson's disease: Secondary | ICD-10-CM | POA: Diagnosis not present

## 2019-10-14 DIAGNOSIS — M6281 Muscle weakness (generalized): Secondary | ICD-10-CM | POA: Diagnosis not present

## 2019-10-14 DIAGNOSIS — R296 Repeated falls: Secondary | ICD-10-CM | POA: Diagnosis not present

## 2019-10-14 DIAGNOSIS — R2689 Other abnormalities of gait and mobility: Secondary | ICD-10-CM | POA: Diagnosis not present

## 2019-10-14 DIAGNOSIS — I83893 Varicose veins of bilateral lower extremities with other complications: Secondary | ICD-10-CM | POA: Diagnosis not present

## 2019-10-14 DIAGNOSIS — M48061 Spinal stenosis, lumbar region without neurogenic claudication: Secondary | ICD-10-CM | POA: Diagnosis not present

## 2019-10-14 DIAGNOSIS — F0280 Dementia in other diseases classified elsewhere without behavioral disturbance: Secondary | ICD-10-CM | POA: Diagnosis not present

## 2019-10-14 DIAGNOSIS — I10 Essential (primary) hypertension: Secondary | ICD-10-CM | POA: Diagnosis not present

## 2019-10-19 ENCOUNTER — Encounter
Admission: RE | Admit: 2019-10-19 | Discharge: 2019-10-19 | Disposition: A | Discharge status: Home / Self Care | Payer: Medicare HMO | Source: Ambulatory Visit | Attending: Internal Medicine | Admitting: Internal Medicine

## 2019-10-26 DIAGNOSIS — M6281 Muscle weakness (generalized): Secondary | ICD-10-CM | POA: Diagnosis not present

## 2019-10-26 DIAGNOSIS — R296 Repeated falls: Secondary | ICD-10-CM | POA: Diagnosis not present

## 2019-10-26 DIAGNOSIS — R2689 Other abnormalities of gait and mobility: Secondary | ICD-10-CM | POA: Diagnosis not present

## 2019-10-26 DIAGNOSIS — M5136 Other intervertebral disc degeneration, lumbar region: Secondary | ICD-10-CM | POA: Diagnosis not present

## 2019-10-26 DIAGNOSIS — G20 Parkinson's disease: Secondary | ICD-10-CM | POA: Diagnosis not present

## 2019-10-26 DIAGNOSIS — I83893 Varicose veins of bilateral lower extremities with other complications: Secondary | ICD-10-CM | POA: Diagnosis not present

## 2019-10-26 DIAGNOSIS — I10 Essential (primary) hypertension: Secondary | ICD-10-CM | POA: Diagnosis not present

## 2019-10-26 DIAGNOSIS — M48061 Spinal stenosis, lumbar region without neurogenic claudication: Secondary | ICD-10-CM | POA: Diagnosis not present

## 2019-10-26 DIAGNOSIS — F0280 Dementia in other diseases classified elsewhere without behavioral disturbance: Secondary | ICD-10-CM | POA: Diagnosis not present

## 2019-10-27 DIAGNOSIS — M5136 Other intervertebral disc degeneration, lumbar region: Secondary | ICD-10-CM | POA: Diagnosis not present

## 2019-10-27 DIAGNOSIS — I83893 Varicose veins of bilateral lower extremities with other complications: Secondary | ICD-10-CM | POA: Diagnosis not present

## 2019-10-27 DIAGNOSIS — G20 Parkinson's disease: Secondary | ICD-10-CM | POA: Diagnosis not present

## 2019-10-27 DIAGNOSIS — R2689 Other abnormalities of gait and mobility: Secondary | ICD-10-CM | POA: Diagnosis not present

## 2019-10-27 DIAGNOSIS — R296 Repeated falls: Secondary | ICD-10-CM | POA: Diagnosis not present

## 2019-10-27 DIAGNOSIS — I10 Essential (primary) hypertension: Secondary | ICD-10-CM | POA: Diagnosis not present

## 2019-10-27 DIAGNOSIS — M6281 Muscle weakness (generalized): Secondary | ICD-10-CM | POA: Diagnosis not present

## 2019-10-27 DIAGNOSIS — M48061 Spinal stenosis, lumbar region without neurogenic claudication: Secondary | ICD-10-CM | POA: Diagnosis not present

## 2019-10-27 DIAGNOSIS — F0280 Dementia in other diseases classified elsewhere without behavioral disturbance: Secondary | ICD-10-CM | POA: Diagnosis not present

## 2019-10-28 DIAGNOSIS — G20 Parkinson's disease: Secondary | ICD-10-CM | POA: Diagnosis not present

## 2019-10-28 DIAGNOSIS — M48061 Spinal stenosis, lumbar region without neurogenic claudication: Secondary | ICD-10-CM | POA: Diagnosis not present

## 2019-10-28 DIAGNOSIS — R296 Repeated falls: Secondary | ICD-10-CM | POA: Diagnosis not present

## 2019-10-28 DIAGNOSIS — F0280 Dementia in other diseases classified elsewhere without behavioral disturbance: Secondary | ICD-10-CM | POA: Diagnosis not present

## 2019-10-28 DIAGNOSIS — R2689 Other abnormalities of gait and mobility: Secondary | ICD-10-CM | POA: Diagnosis not present

## 2019-10-28 DIAGNOSIS — I83893 Varicose veins of bilateral lower extremities with other complications: Secondary | ICD-10-CM | POA: Diagnosis not present

## 2019-10-28 DIAGNOSIS — I10 Essential (primary) hypertension: Secondary | ICD-10-CM | POA: Diagnosis not present

## 2019-10-28 DIAGNOSIS — M6281 Muscle weakness (generalized): Secondary | ICD-10-CM | POA: Diagnosis not present

## 2019-10-28 DIAGNOSIS — M5136 Other intervertebral disc degeneration, lumbar region: Secondary | ICD-10-CM | POA: Diagnosis not present

## 2019-10-31 DIAGNOSIS — G20 Parkinson's disease: Secondary | ICD-10-CM | POA: Diagnosis not present

## 2019-10-31 DIAGNOSIS — R2689 Other abnormalities of gait and mobility: Secondary | ICD-10-CM | POA: Diagnosis not present

## 2019-10-31 DIAGNOSIS — M48061 Spinal stenosis, lumbar region without neurogenic claudication: Secondary | ICD-10-CM | POA: Diagnosis not present

## 2019-10-31 DIAGNOSIS — F0280 Dementia in other diseases classified elsewhere without behavioral disturbance: Secondary | ICD-10-CM | POA: Diagnosis not present

## 2019-10-31 DIAGNOSIS — I83893 Varicose veins of bilateral lower extremities with other complications: Secondary | ICD-10-CM | POA: Diagnosis not present

## 2019-10-31 DIAGNOSIS — I10 Essential (primary) hypertension: Secondary | ICD-10-CM | POA: Diagnosis not present

## 2019-10-31 DIAGNOSIS — M6281 Muscle weakness (generalized): Secondary | ICD-10-CM | POA: Diagnosis not present

## 2019-10-31 DIAGNOSIS — M5136 Other intervertebral disc degeneration, lumbar region: Secondary | ICD-10-CM | POA: Diagnosis not present

## 2019-10-31 DIAGNOSIS — R296 Repeated falls: Secondary | ICD-10-CM | POA: Diagnosis not present

## 2019-11-02 DIAGNOSIS — G20 Parkinson's disease: Secondary | ICD-10-CM | POA: Diagnosis not present

## 2019-11-02 DIAGNOSIS — I10 Essential (primary) hypertension: Secondary | ICD-10-CM | POA: Diagnosis not present

## 2019-11-02 DIAGNOSIS — R2689 Other abnormalities of gait and mobility: Secondary | ICD-10-CM | POA: Diagnosis not present

## 2019-11-02 DIAGNOSIS — F0280 Dementia in other diseases classified elsewhere without behavioral disturbance: Secondary | ICD-10-CM | POA: Diagnosis not present

## 2019-11-02 DIAGNOSIS — I83893 Varicose veins of bilateral lower extremities with other complications: Secondary | ICD-10-CM | POA: Diagnosis not present

## 2019-11-02 DIAGNOSIS — R296 Repeated falls: Secondary | ICD-10-CM | POA: Diagnosis not present

## 2019-11-02 DIAGNOSIS — M5136 Other intervertebral disc degeneration, lumbar region: Secondary | ICD-10-CM | POA: Diagnosis not present

## 2019-11-02 DIAGNOSIS — M48061 Spinal stenosis, lumbar region without neurogenic claudication: Secondary | ICD-10-CM | POA: Diagnosis not present

## 2019-11-02 DIAGNOSIS — M6281 Muscle weakness (generalized): Secondary | ICD-10-CM | POA: Diagnosis not present

## 2019-11-03 DIAGNOSIS — G20 Parkinson's disease: Secondary | ICD-10-CM | POA: Diagnosis not present

## 2019-11-03 DIAGNOSIS — R296 Repeated falls: Secondary | ICD-10-CM | POA: Diagnosis not present

## 2019-11-03 DIAGNOSIS — R2689 Other abnormalities of gait and mobility: Secondary | ICD-10-CM | POA: Diagnosis not present

## 2019-11-03 DIAGNOSIS — F0280 Dementia in other diseases classified elsewhere without behavioral disturbance: Secondary | ICD-10-CM | POA: Diagnosis not present

## 2019-11-03 DIAGNOSIS — I10 Essential (primary) hypertension: Secondary | ICD-10-CM | POA: Diagnosis not present

## 2019-11-03 DIAGNOSIS — M5136 Other intervertebral disc degeneration, lumbar region: Secondary | ICD-10-CM | POA: Diagnosis not present

## 2019-11-03 DIAGNOSIS — M48061 Spinal stenosis, lumbar region without neurogenic claudication: Secondary | ICD-10-CM | POA: Diagnosis not present

## 2019-11-03 DIAGNOSIS — M6281 Muscle weakness (generalized): Secondary | ICD-10-CM | POA: Diagnosis not present

## 2019-11-03 DIAGNOSIS — I83893 Varicose veins of bilateral lower extremities with other complications: Secondary | ICD-10-CM | POA: Diagnosis not present

## 2019-11-04 DIAGNOSIS — M5136 Other intervertebral disc degeneration, lumbar region: Secondary | ICD-10-CM | POA: Diagnosis not present

## 2019-11-04 DIAGNOSIS — G20 Parkinson's disease: Secondary | ICD-10-CM | POA: Diagnosis not present

## 2019-11-04 DIAGNOSIS — I83893 Varicose veins of bilateral lower extremities with other complications: Secondary | ICD-10-CM | POA: Diagnosis not present

## 2019-11-04 DIAGNOSIS — M48061 Spinal stenosis, lumbar region without neurogenic claudication: Secondary | ICD-10-CM | POA: Diagnosis not present

## 2019-11-04 DIAGNOSIS — R296 Repeated falls: Secondary | ICD-10-CM | POA: Diagnosis not present

## 2019-11-04 DIAGNOSIS — F0280 Dementia in other diseases classified elsewhere without behavioral disturbance: Secondary | ICD-10-CM | POA: Diagnosis not present

## 2019-11-04 DIAGNOSIS — I10 Essential (primary) hypertension: Secondary | ICD-10-CM | POA: Diagnosis not present

## 2019-11-04 DIAGNOSIS — M6281 Muscle weakness (generalized): Secondary | ICD-10-CM | POA: Diagnosis not present

## 2019-11-04 DIAGNOSIS — R2689 Other abnormalities of gait and mobility: Secondary | ICD-10-CM | POA: Diagnosis not present

## 2019-11-07 DIAGNOSIS — M5136 Other intervertebral disc degeneration, lumbar region: Secondary | ICD-10-CM | POA: Diagnosis not present

## 2019-11-07 DIAGNOSIS — F0280 Dementia in other diseases classified elsewhere without behavioral disturbance: Secondary | ICD-10-CM | POA: Diagnosis not present

## 2019-11-07 DIAGNOSIS — G20 Parkinson's disease: Secondary | ICD-10-CM | POA: Diagnosis not present

## 2019-11-07 DIAGNOSIS — I83893 Varicose veins of bilateral lower extremities with other complications: Secondary | ICD-10-CM | POA: Diagnosis not present

## 2019-11-07 DIAGNOSIS — R2689 Other abnormalities of gait and mobility: Secondary | ICD-10-CM | POA: Diagnosis not present

## 2019-11-07 DIAGNOSIS — M48061 Spinal stenosis, lumbar region without neurogenic claudication: Secondary | ICD-10-CM | POA: Diagnosis not present

## 2019-11-07 DIAGNOSIS — M6281 Muscle weakness (generalized): Secondary | ICD-10-CM | POA: Diagnosis not present

## 2019-11-07 DIAGNOSIS — R296 Repeated falls: Secondary | ICD-10-CM | POA: Diagnosis not present

## 2019-11-07 DIAGNOSIS — I10 Essential (primary) hypertension: Secondary | ICD-10-CM | POA: Diagnosis not present

## 2019-11-08 DIAGNOSIS — F0280 Dementia in other diseases classified elsewhere without behavioral disturbance: Secondary | ICD-10-CM | POA: Diagnosis not present

## 2019-11-08 DIAGNOSIS — R2689 Other abnormalities of gait and mobility: Secondary | ICD-10-CM | POA: Diagnosis not present

## 2019-11-08 DIAGNOSIS — M5136 Other intervertebral disc degeneration, lumbar region: Secondary | ICD-10-CM | POA: Diagnosis not present

## 2019-11-08 DIAGNOSIS — G20 Parkinson's disease: Secondary | ICD-10-CM | POA: Diagnosis not present

## 2019-11-08 DIAGNOSIS — I83893 Varicose veins of bilateral lower extremities with other complications: Secondary | ICD-10-CM | POA: Diagnosis not present

## 2019-11-08 DIAGNOSIS — I10 Essential (primary) hypertension: Secondary | ICD-10-CM | POA: Diagnosis not present

## 2019-11-08 DIAGNOSIS — R296 Repeated falls: Secondary | ICD-10-CM | POA: Diagnosis not present

## 2019-11-08 DIAGNOSIS — M6281 Muscle weakness (generalized): Secondary | ICD-10-CM | POA: Diagnosis not present

## 2019-11-08 DIAGNOSIS — M48061 Spinal stenosis, lumbar region without neurogenic claudication: Secondary | ICD-10-CM | POA: Diagnosis not present

## 2019-11-09 DIAGNOSIS — F0280 Dementia in other diseases classified elsewhere without behavioral disturbance: Secondary | ICD-10-CM | POA: Diagnosis not present

## 2019-11-09 DIAGNOSIS — M5136 Other intervertebral disc degeneration, lumbar region: Secondary | ICD-10-CM | POA: Diagnosis not present

## 2019-11-09 DIAGNOSIS — R2689 Other abnormalities of gait and mobility: Secondary | ICD-10-CM | POA: Diagnosis not present

## 2019-11-09 DIAGNOSIS — M6281 Muscle weakness (generalized): Secondary | ICD-10-CM | POA: Diagnosis not present

## 2019-11-09 DIAGNOSIS — I10 Essential (primary) hypertension: Secondary | ICD-10-CM | POA: Diagnosis not present

## 2019-11-09 DIAGNOSIS — I83893 Varicose veins of bilateral lower extremities with other complications: Secondary | ICD-10-CM | POA: Diagnosis not present

## 2019-11-09 DIAGNOSIS — G20 Parkinson's disease: Secondary | ICD-10-CM | POA: Diagnosis not present

## 2019-11-09 DIAGNOSIS — R296 Repeated falls: Secondary | ICD-10-CM | POA: Diagnosis not present

## 2019-11-09 DIAGNOSIS — M48061 Spinal stenosis, lumbar region without neurogenic claudication: Secondary | ICD-10-CM | POA: Diagnosis not present

## 2019-11-11 DIAGNOSIS — R296 Repeated falls: Secondary | ICD-10-CM | POA: Diagnosis not present

## 2019-11-11 DIAGNOSIS — M5136 Other intervertebral disc degeneration, lumbar region: Secondary | ICD-10-CM | POA: Diagnosis not present

## 2019-11-11 DIAGNOSIS — R2689 Other abnormalities of gait and mobility: Secondary | ICD-10-CM | POA: Diagnosis not present

## 2019-11-11 DIAGNOSIS — G20 Parkinson's disease: Secondary | ICD-10-CM | POA: Diagnosis not present

## 2019-11-11 DIAGNOSIS — I10 Essential (primary) hypertension: Secondary | ICD-10-CM | POA: Diagnosis not present

## 2019-11-11 DIAGNOSIS — M6281 Muscle weakness (generalized): Secondary | ICD-10-CM | POA: Diagnosis not present

## 2019-11-11 DIAGNOSIS — I83893 Varicose veins of bilateral lower extremities with other complications: Secondary | ICD-10-CM | POA: Diagnosis not present

## 2019-11-11 DIAGNOSIS — M48061 Spinal stenosis, lumbar region without neurogenic claudication: Secondary | ICD-10-CM | POA: Diagnosis not present

## 2019-11-11 DIAGNOSIS — F0280 Dementia in other diseases classified elsewhere without behavioral disturbance: Secondary | ICD-10-CM | POA: Diagnosis not present

## 2019-11-15 DIAGNOSIS — F0280 Dementia in other diseases classified elsewhere without behavioral disturbance: Secondary | ICD-10-CM | POA: Diagnosis not present

## 2019-11-15 DIAGNOSIS — M6281 Muscle weakness (generalized): Secondary | ICD-10-CM | POA: Diagnosis not present

## 2019-11-15 DIAGNOSIS — I83893 Varicose veins of bilateral lower extremities with other complications: Secondary | ICD-10-CM | POA: Diagnosis not present

## 2019-11-15 DIAGNOSIS — G20 Parkinson's disease: Secondary | ICD-10-CM | POA: Diagnosis not present

## 2019-11-15 DIAGNOSIS — M48061 Spinal stenosis, lumbar region without neurogenic claudication: Secondary | ICD-10-CM | POA: Diagnosis not present

## 2019-11-15 DIAGNOSIS — R2689 Other abnormalities of gait and mobility: Secondary | ICD-10-CM | POA: Diagnosis not present

## 2019-11-15 DIAGNOSIS — M5136 Other intervertebral disc degeneration, lumbar region: Secondary | ICD-10-CM | POA: Diagnosis not present

## 2019-11-15 DIAGNOSIS — I10 Essential (primary) hypertension: Secondary | ICD-10-CM | POA: Diagnosis not present

## 2019-11-15 DIAGNOSIS — R296 Repeated falls: Secondary | ICD-10-CM | POA: Diagnosis not present

## 2019-11-18 DIAGNOSIS — R2689 Other abnormalities of gait and mobility: Secondary | ICD-10-CM | POA: Diagnosis not present

## 2019-11-18 DIAGNOSIS — I83893 Varicose veins of bilateral lower extremities with other complications: Secondary | ICD-10-CM | POA: Diagnosis not present

## 2019-11-18 DIAGNOSIS — I10 Essential (primary) hypertension: Secondary | ICD-10-CM | POA: Diagnosis not present

## 2019-11-18 DIAGNOSIS — M5136 Other intervertebral disc degeneration, lumbar region: Secondary | ICD-10-CM | POA: Diagnosis not present

## 2019-11-18 DIAGNOSIS — F0280 Dementia in other diseases classified elsewhere without behavioral disturbance: Secondary | ICD-10-CM | POA: Diagnosis not present

## 2019-11-18 DIAGNOSIS — G20 Parkinson's disease: Secondary | ICD-10-CM | POA: Diagnosis not present

## 2019-11-18 DIAGNOSIS — R296 Repeated falls: Secondary | ICD-10-CM | POA: Diagnosis not present

## 2019-11-18 DIAGNOSIS — M6281 Muscle weakness (generalized): Secondary | ICD-10-CM | POA: Diagnosis not present

## 2019-11-18 DIAGNOSIS — M48061 Spinal stenosis, lumbar region without neurogenic claudication: Secondary | ICD-10-CM | POA: Diagnosis not present

## 2019-11-22 DIAGNOSIS — M48061 Spinal stenosis, lumbar region without neurogenic claudication: Secondary | ICD-10-CM | POA: Diagnosis not present

## 2019-11-22 DIAGNOSIS — I83893 Varicose veins of bilateral lower extremities with other complications: Secondary | ICD-10-CM | POA: Diagnosis not present

## 2019-11-22 DIAGNOSIS — M5136 Other intervertebral disc degeneration, lumbar region: Secondary | ICD-10-CM | POA: Diagnosis not present

## 2019-11-22 DIAGNOSIS — M6281 Muscle weakness (generalized): Secondary | ICD-10-CM | POA: Diagnosis not present

## 2019-11-22 DIAGNOSIS — R296 Repeated falls: Secondary | ICD-10-CM | POA: Diagnosis not present

## 2019-11-22 DIAGNOSIS — R2689 Other abnormalities of gait and mobility: Secondary | ICD-10-CM | POA: Diagnosis not present

## 2019-11-22 DIAGNOSIS — F0280 Dementia in other diseases classified elsewhere without behavioral disturbance: Secondary | ICD-10-CM | POA: Diagnosis not present

## 2019-11-22 DIAGNOSIS — I10 Essential (primary) hypertension: Secondary | ICD-10-CM | POA: Diagnosis not present

## 2019-11-22 DIAGNOSIS — G20 Parkinson's disease: Secondary | ICD-10-CM | POA: Diagnosis not present

## 2019-11-24 DIAGNOSIS — M48061 Spinal stenosis, lumbar region without neurogenic claudication: Secondary | ICD-10-CM | POA: Diagnosis not present

## 2019-11-24 DIAGNOSIS — M5136 Other intervertebral disc degeneration, lumbar region: Secondary | ICD-10-CM | POA: Diagnosis not present

## 2019-11-24 DIAGNOSIS — I83893 Varicose veins of bilateral lower extremities with other complications: Secondary | ICD-10-CM | POA: Diagnosis not present

## 2019-11-24 DIAGNOSIS — F0280 Dementia in other diseases classified elsewhere without behavioral disturbance: Secondary | ICD-10-CM | POA: Diagnosis not present

## 2019-11-24 DIAGNOSIS — G20 Parkinson's disease: Secondary | ICD-10-CM | POA: Diagnosis not present

## 2019-11-24 DIAGNOSIS — R2689 Other abnormalities of gait and mobility: Secondary | ICD-10-CM | POA: Diagnosis not present

## 2019-11-24 DIAGNOSIS — R296 Repeated falls: Secondary | ICD-10-CM | POA: Diagnosis not present

## 2019-11-24 DIAGNOSIS — M6281 Muscle weakness (generalized): Secondary | ICD-10-CM | POA: Diagnosis not present

## 2019-11-24 DIAGNOSIS — I10 Essential (primary) hypertension: Secondary | ICD-10-CM | POA: Diagnosis not present

## 2019-11-25 DIAGNOSIS — F0280 Dementia in other diseases classified elsewhere without behavioral disturbance: Secondary | ICD-10-CM | POA: Diagnosis not present

## 2019-11-25 DIAGNOSIS — M48061 Spinal stenosis, lumbar region without neurogenic claudication: Secondary | ICD-10-CM | POA: Diagnosis not present

## 2019-11-25 DIAGNOSIS — M5136 Other intervertebral disc degeneration, lumbar region: Secondary | ICD-10-CM | POA: Diagnosis not present

## 2019-11-25 DIAGNOSIS — I83893 Varicose veins of bilateral lower extremities with other complications: Secondary | ICD-10-CM | POA: Diagnosis not present

## 2019-11-25 DIAGNOSIS — M6281 Muscle weakness (generalized): Secondary | ICD-10-CM | POA: Diagnosis not present

## 2019-11-25 DIAGNOSIS — G20 Parkinson's disease: Secondary | ICD-10-CM | POA: Diagnosis not present

## 2019-11-25 DIAGNOSIS — R2689 Other abnormalities of gait and mobility: Secondary | ICD-10-CM | POA: Diagnosis not present

## 2019-11-25 DIAGNOSIS — R296 Repeated falls: Secondary | ICD-10-CM | POA: Diagnosis not present

## 2019-11-25 DIAGNOSIS — I10 Essential (primary) hypertension: Secondary | ICD-10-CM | POA: Diagnosis not present

## 2019-11-30 ENCOUNTER — Encounter
Admission: RE | Admit: 2019-11-30 | Discharge: 2019-11-30 | Disposition: A | Discharge status: Home / Self Care | Payer: Medicare HMO | Source: Ambulatory Visit | Attending: Internal Medicine | Admitting: Internal Medicine

## 2019-12-06 DIAGNOSIS — R32 Unspecified urinary incontinence: Secondary | ICD-10-CM | POA: Insufficient documentation

## 2019-12-07 ENCOUNTER — Other Ambulatory Visit
Admission: RE | Admit: 2019-12-07 | Discharge: 2019-12-07 | Disposition: A | Discharge status: Home / Self Care | Payer: Medicare HMO | Source: Ambulatory Visit | Attending: Internal Medicine | Admitting: Internal Medicine

## 2019-12-07 DIAGNOSIS — R32 Unspecified urinary incontinence: Secondary | ICD-10-CM | POA: Insufficient documentation

## 2019-12-07 LAB — URINALYSIS, ROUTINE W REFLEX MICROSCOPIC
Bilirubin Urine: NEGATIVE
Glucose, UA: NEGATIVE mg/dL
Hgb urine dipstick: NEGATIVE
Ketones, ur: NEGATIVE mg/dL
Leukocytes,Ua: NEGATIVE
Nitrite: NEGATIVE
Protein, ur: NEGATIVE mg/dL
Specific Gravity, Urine: 1.016 (ref 1.005–1.030)
pH: 6 (ref 5.0–8.0)

## 2019-12-08 LAB — URINE CULTURE: Culture: <10000 — AB

## 2019-12-26 IMAGING — CT CT HEAD W/O CM
4 of 5 series · 14 of 47 positions shown, 16 images · non-contrast
Comparison: None.

CLINICAL DATA: Pain following fall

EXAM:
CT HEAD WITHOUT CONTRAST
TECHNIQUE: Contiguous axial images were obtained from the base of the skull
through the vertex without intravenous contrast.

[Series 2: head wo · axial · 0.47mm/px · z∈[-0,+115]mm · 5 of 35 slices shown, 7 images]
[im 6/35  brain]
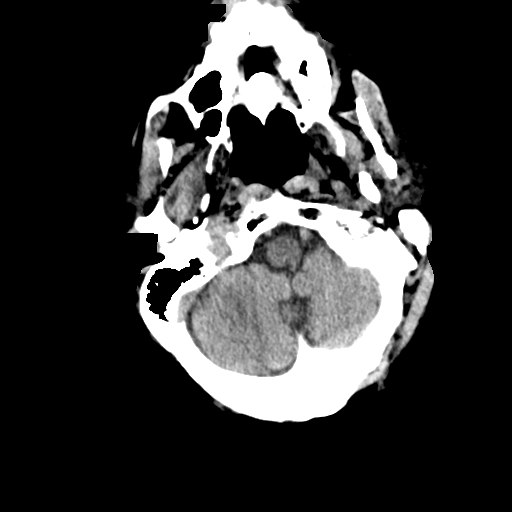
[im 6/35  bone]
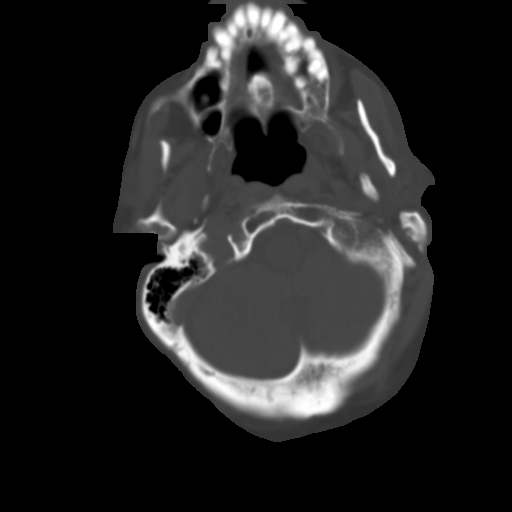
[im 12/35  brain]
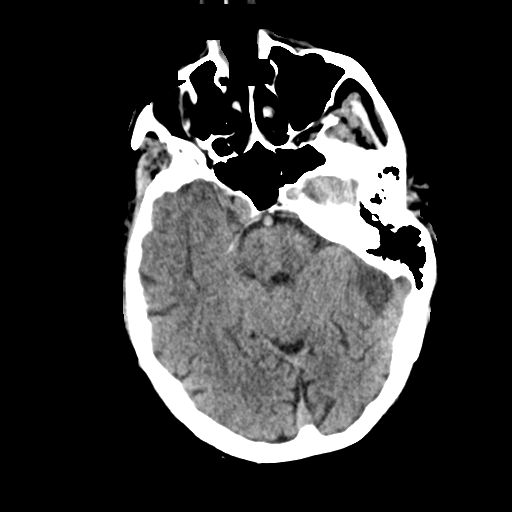
[im 18/35  brain]
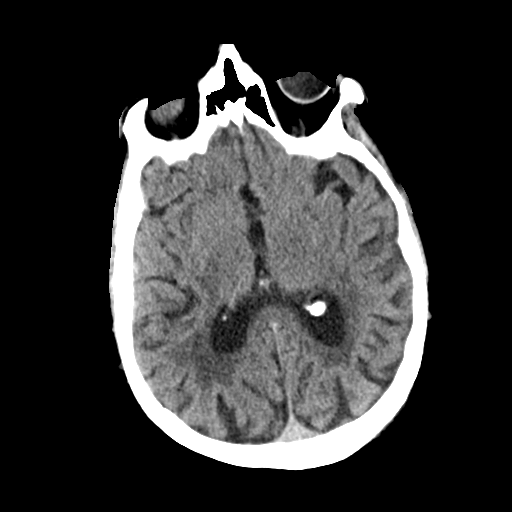
[im 23/35  brain]
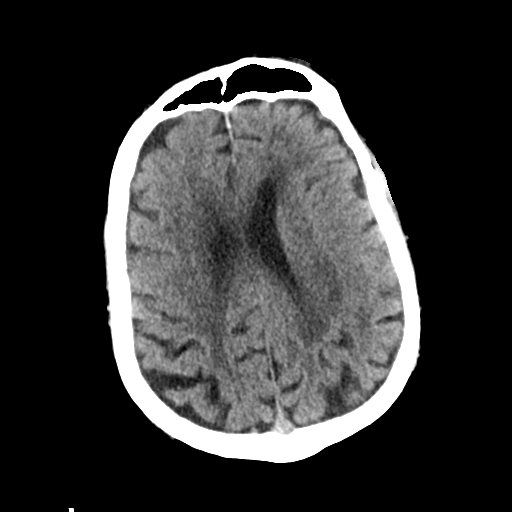
[im 29/35  brain]
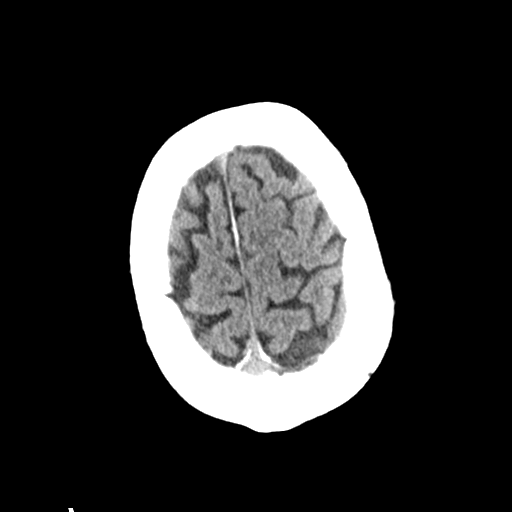
[im 29/35  bone]
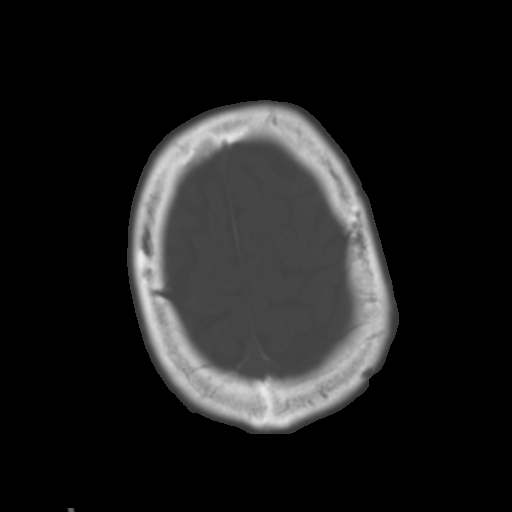

[Series 4: head wo recon · axial · 0.38mm/px · z∈[+56,+103]mm · 3 of 33 slices shown]
[im 6/33  brain]
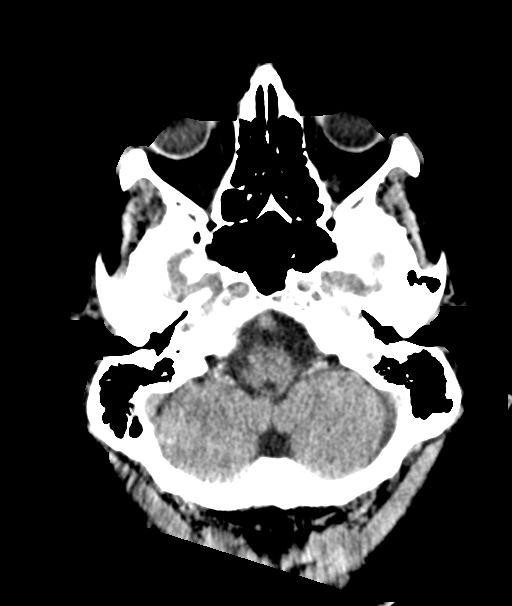
[im 11/33  brain]
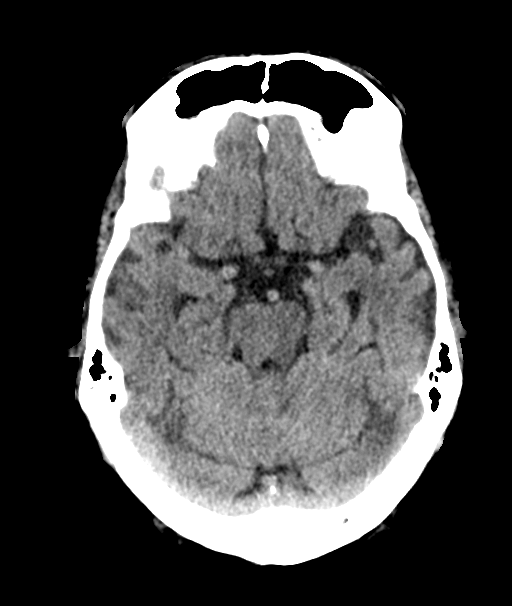
[im 17/33  brain]
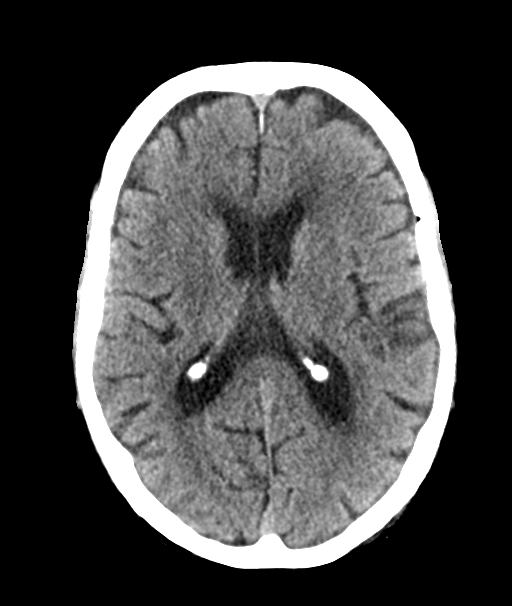

[Series 6: coronal soft tissue · coronal · 0.33mm/px · 3 of 66 slices shown]
[im 22/66  brain]
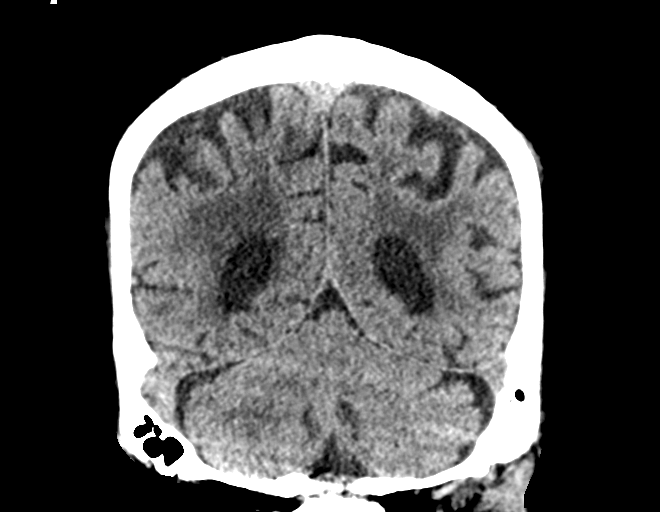
[im 29/66  brain]
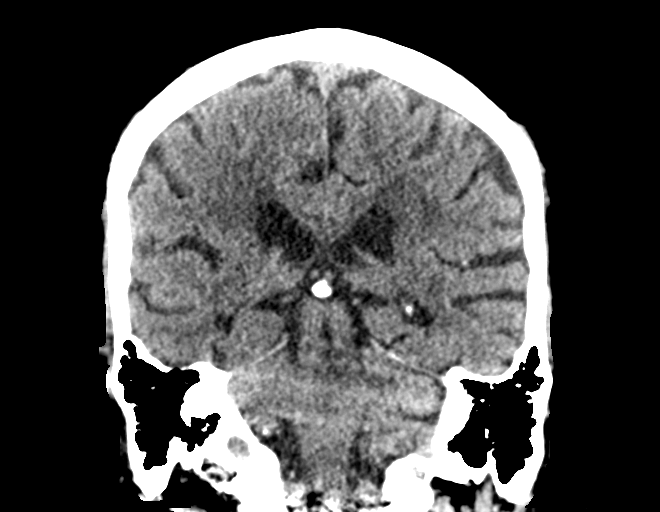
[im 37/66  brain]
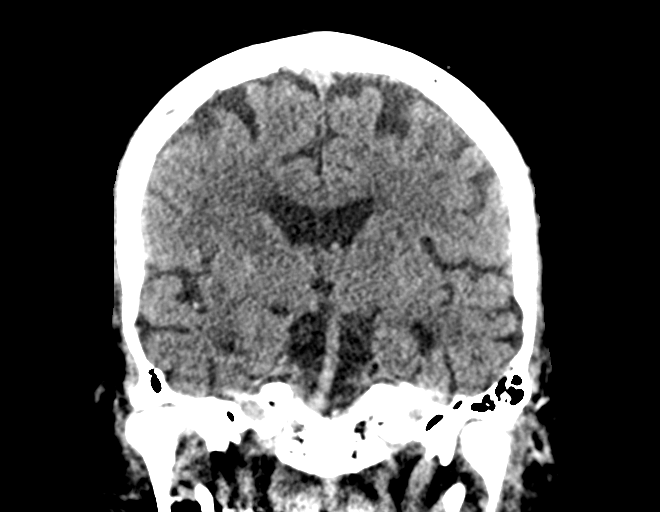

[Series 7: sagittal soft tissue · sagittal · 0.32mm/px · 3 of 54 slices shown]
[im 23/54  brain]
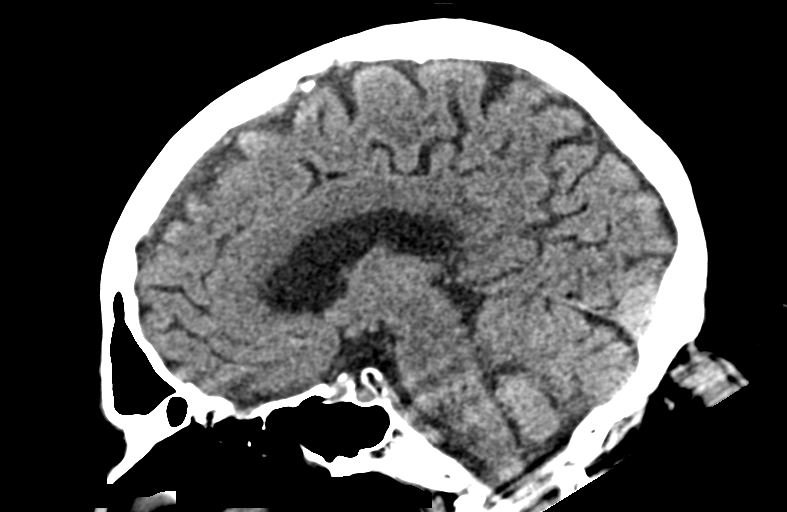
[im 27/54  brain]
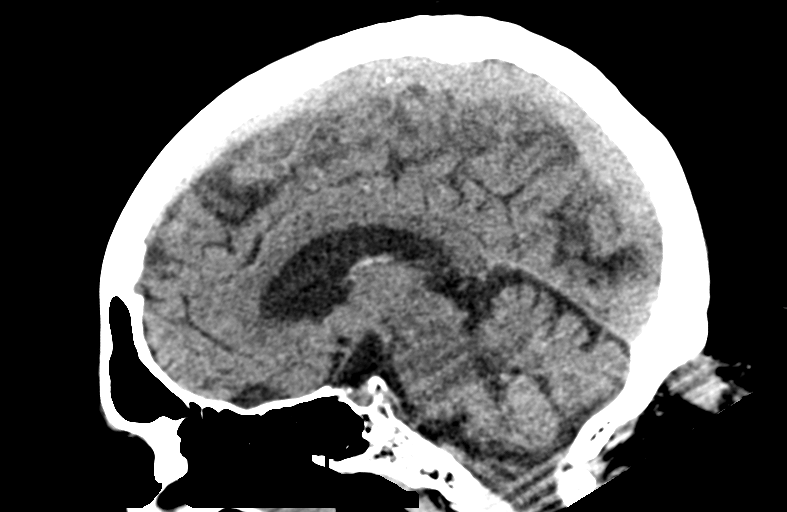
[im 32/54  brain]
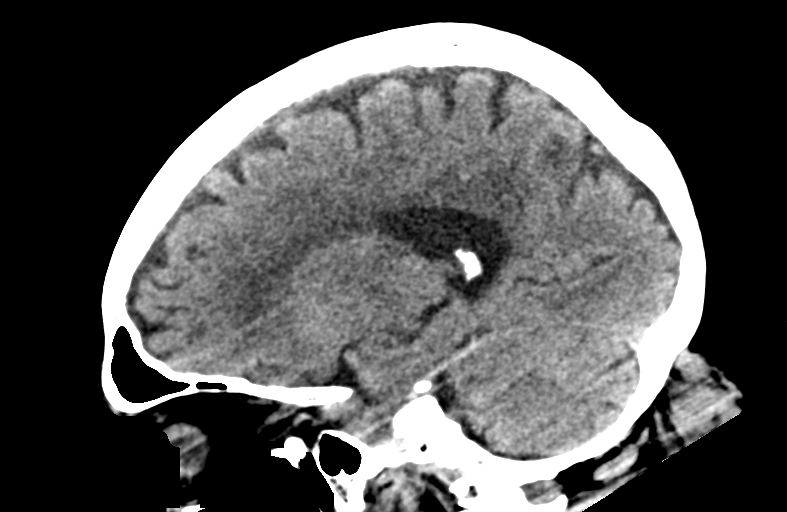

[14 of 47 positions shown; findings below may reference images not displayed]

FINDINGS: Brain: There is mild diffuse atrophy. There is no intracranial mass,
hemorrhage, extra-axial fluid collection, or midline shift. There is
small vessel disease throughout much of the centra semiovale
bilaterally. Elsewhere gray-white compartments appear normal. No
acute infarct evident.

Vascular: No evident hyperdense vessel. There are foci of
calcification in each cavernous carotid artery.

Skull: Bony calvarium appears intact.

Sinuses/Orbits: There is mild mucosal thickening in the maxillary
antra bilaterally. There is mucosal thickening in several ethmoid
air cells. Other visualized paranasal sinuses are clear. Orbits
appear symmetric bilaterally.

Other: Mastoid air cells are clear.
IMPRESSION: Mild atrophy with supratentorial small vessel disease. No acute
infarct evident. No mass or hemorrhage.

Mild arterial vascular calcification bilaterally. There is mild
paranasal sinus disease.

## 2020-01-09 DIAGNOSIS — Z634 Disappearance and death of family member: Secondary | ICD-10-CM | POA: Insufficient documentation

## 2020-02-21 ENCOUNTER — Encounter
Admission: RE | Admit: 2020-02-21 | Discharge: 2020-02-21 | Disposition: A | Discharge status: Home / Self Care | Payer: Medicare HMO | Source: Ambulatory Visit | Attending: Internal Medicine | Admitting: Internal Medicine

## 2020-03-16 ENCOUNTER — Other Ambulatory Visit: Payer: Self-pay

## 2020-03-16 ENCOUNTER — Encounter: Payer: Self-pay | Admitting: Urology

## 2020-03-16 ENCOUNTER — Ambulatory Visit (INDEPENDENT_AMBULATORY_CARE_PROVIDER_SITE_OTHER): Payer: Medicare HMO | Admitting: Urology

## 2020-03-16 VITALS — BP 178/78 | HR 74 | Ht 67.0 in | Wt 176.4 lb

## 2020-03-16 DIAGNOSIS — R351 Nocturia: Secondary | ICD-10-CM | POA: Diagnosis not present

## 2020-03-16 DIAGNOSIS — R32 Unspecified urinary incontinence: Secondary | ICD-10-CM

## 2020-03-16 MED ORDER — TAMSULOSIN HCL 0.4 MG PO CAPS: 0.4000 mg | ORAL_CAPSULE | Freq: Every day | ORAL | 3 refills | Status: DC

## 2020-03-16 NOTE — Patient Instructions (Signed)

## 2020-03-16 NOTE — Progress Notes (Signed)
03/16/2020 10:42 AM   Gavin Garcia [birth year] [id number]  Referring provider: Kirk Ruths, MD [facility], [place],  [place] [address]  Chief Complaint  Patient presents with  . Urinary Incontinence  . Nocturia    HPI:  Gavin Garcia was referred for nocturia and incontinence. Daytime OK. His stream starts and stops. He doesn't have urgency and sometime doesn't wake up to void. No gross hematuria. Limited mobility. Usually in wheelchair. Can stand and pivot. He is usually constipated. He doesn't drink a lot of fluids.   He has a H/o BPH and is on finasteride monotherapy.   NG risk includes Parkinsons and dementia.   His PVR is 9 ml.   He was Art gallery manager.    PMH: Past Medical History:  Diagnosis Date  . Basal cell carcinoma   . Glaucoma (increased eye pressure)   . Osteoporosis   . Parkinson's disease ([place])   . Prostate enlargement     Surgical History: Past Surgical History:  Procedure Laterality Date  . NO PAST SURGERIES      Home Medications:  Allergies as of 03/16/2020      Reactions   Codeine Nausea And Vomiting      Medication List       Accurate as of March 16, 2020 10:42 AM. If you have any questions, ask your nurse or doctor.        acetaminophen 325 MG tablet Commonly known as: TYLENOL Take 2 tablets (650 mg total) by mouth every 6 (six) hours as needed for mild pain (or Fever >/= 101).   amLODipine 2.5 MG tablet Commonly known as: NORVASC Take 1 tablet (2.5 mg total) by mouth daily. Please hold if systolic BP <585   aspirin 81 MG EC tablet Take 1 tablet (81 mg total) by mouth daily.   carbidopa-levodopa 25-250 MG tablet Commonly known as: SINEMET IR Take 1 tablet by mouth 3 (three) times daily.   Dermacloud Crea Apply 1 application topically daily as needed (skin irritation/breakdown).   dorzolamide-timolol 22.3-6.8 MG/ML ophthalmic solution Commonly known as: COSOPT Place 1 drop into the  left eye 2 (two) times daily.   finasteride 5 MG tablet Commonly known as: PROSCAR Take 1 tablet (5 mg total) by mouth daily.   furosemide 20 MG tablet Commonly known as: LASIX Take 1 tablet (20 mg total) by mouth daily as needed for edema. What changed:   how much to take  when to take this   latanoprost 0.005 % ophthalmic solution Commonly known as: XALATAN Place 1 drop into both eyes at bedtime.   mupirocin ointment 2 % Commonly known as: BACTROBAN Place 1 application into the nose 2 (two) times daily as needed (leg wounds).   polyethylene glycol powder 17 GM/SCOOP powder Commonly known as: GLYCOLAX/MIRALAX Take 17 g by mouth daily.       Allergies:  Allergies  Allergen Reactions  . Codeine Nausea And Vomiting    Family History: No family history on file.  Social History:  reports that he has never smoked. He has never used smokeless tobacco. He reports that he does not drink alcohol and does not use drugs.   Physical Exam: There were no vitals taken for this visit.  Constitutional:  Alert and oriented, No acute distress. HEENT: [place] AT, moist mucus membranes.  Trachea midline, no masses. Cardiovascular: No clubbing, cyanosis, or edema. Respiratory: Normal respiratory effort, no increased work of breathing. GI: Abdomen is soft, nontender, nondistended,  no abdominal masses GU: No CVA tenderness Skin: No rashes, bruises or suspicious lesions. Neurologic: Grossly intact, no focal deficits, moving all 4 extremities. Psychiatric: Normal mood and affect.  Laboratory Data: Lab Results  Component Value Date   WBC 7.3 02/06/2019   HGB 13.2 02/06/2019   HCT 39.1 02/06/2019   MCV 91.1 02/06/2019   PLT 141 (L) 02/06/2019    Lab Results  Component Value Date   CREATININE 0.78 02/06/2019    No results found for: PSA  No results found for: TESTOSTERONE  Lab Results  Component Value Date   HGBA1C 5.4 02/06/2019    Urinalysis    Component Value  Date/Time   COLORURINE YELLOW (A) 12/07/2019 0535   APPEARANCEUR CLEAR (A) 12/07/2019 0535   APPEARANCEUR CLOUDY 10/08/2011 0859   LABSPEC 1.016 12/07/2019 0535   LABSPEC 1.020 10/08/2011 0859   PHURINE 6.0 12/07/2019 0535   GLUCOSEU NEGATIVE 12/07/2019 0535   GLUCOSEU NEGATIVE 10/08/2011 0859   HGBUR NEGATIVE 12/07/2019 0535   BILIRUBINUR NEGATIVE 12/07/2019 0535   BILIRUBINUR NEGATIVE 10/08/2011 0859   KETONESUR NEGATIVE 12/07/2019 0535   PROTEINUR NEGATIVE 12/07/2019 0535   NITRITE NEGATIVE 12/07/2019 0535   LEUKOCYTESUR NEGATIVE 12/07/2019 0535   LEUKOCYTESUR 3+ 10/08/2011 0859    Lab Results  Component Value Date   BACTERIA NONE SEEN 02/03/2019    Pertinent Imaging: n/a No results found for this or any previous visit.  No results found for this or any previous visit.  No results found for this or any previous visit.  No results found for this or any previous visit.  No results found for this or any previous visit.  No results found for this or any previous visit.  No results found for this or any previous visit.  No results found for this or any previous visit.   Assessment & Plan:    1. Nocturia PVR normal. Difficult to treat the incontinence if he is not waking up to void. He could consider a bedside alarm. Also disc the nature r/b of surveillance, alpha blockers , beta 3. Will give him a trial of tamsulosin.  - Urinalysis, Complete - Bladder Scan (Post Void Residual) in office  2. Urinary incontinence, unspecified type Some may be NG or functional . See above.  - Bladder Scan (Post Void Residual) in office   No follow-ups on file.  Festus Aloe, MD  [facility] [address], [place], [place] [address] [phone]

## 2020-04-10 ENCOUNTER — Other Ambulatory Visit
Admission: RE | Admit: 2020-04-10 | Discharge: 2020-04-10 | Disposition: A | Discharge status: Home / Self Care | Payer: Medicare HMO | Source: Skilled Nursing Facility | Attending: Internal Medicine | Admitting: Internal Medicine

## 2020-04-10 DIAGNOSIS — R109 Unspecified abdominal pain: Secondary | ICD-10-CM | POA: Insufficient documentation

## 2020-04-10 DIAGNOSIS — R410 Disorientation, unspecified: Secondary | ICD-10-CM | POA: Insufficient documentation

## 2020-04-10 LAB — URINALYSIS, COMPLETE (UACMP) WITH MICROSCOPIC
Bacteria, UA: NONE SEEN
Bilirubin Urine: NEGATIVE
Glucose, UA: NEGATIVE mg/dL
Hgb urine dipstick: NEGATIVE
Ketones, ur: NEGATIVE mg/dL
Leukocytes,Ua: NEGATIVE
Nitrite: NEGATIVE
Protein, ur: NEGATIVE mg/dL
Specific Gravity, Urine: 1.01 (ref 1.005–1.030)
Squamous Epithelial / HPF: NONE SEEN (ref 0–5)
WBC, UA: NONE SEEN WBC/hpf (ref 0–5)
pH: 7 (ref 5.0–8.0)

## 2020-04-11 LAB — URINE CULTURE: Culture: <10000 — AB

## 2020-05-21 DIAGNOSIS — I779 Disorder of arteries and arterioles, unspecified: Secondary | ICD-10-CM | POA: Diagnosis not present

## 2020-05-21 DIAGNOSIS — G20 Parkinson's disease: Secondary | ICD-10-CM | POA: Diagnosis not present

## 2020-05-21 DIAGNOSIS — F0280 Dementia in other diseases classified elsewhere without behavioral disturbance: Secondary | ICD-10-CM | POA: Diagnosis not present

## 2020-05-21 DIAGNOSIS — Z789 Other specified health status: Secondary | ICD-10-CM | POA: Diagnosis not present

## 2020-06-12 ENCOUNTER — Encounter: Payer: Self-pay | Admitting: Urology

## 2020-06-15 DIAGNOSIS — Z0100 Encounter for examination of eyes and vision without abnormal findings: Secondary | ICD-10-CM | POA: Diagnosis not present

## 2020-06-22 ENCOUNTER — Encounter: Payer: Self-pay | Admitting: Physician Assistant

## 2020-06-22 ENCOUNTER — Ambulatory Visit (INDEPENDENT_AMBULATORY_CARE_PROVIDER_SITE_OTHER): Payer: Medicare HMO | Admitting: Physician Assistant

## 2020-06-22 ENCOUNTER — Ambulatory Visit: Payer: Self-pay | Admitting: Urology

## 2020-06-22 ENCOUNTER — Other Ambulatory Visit: Payer: Self-pay

## 2020-06-22 VITALS — BP 147/73 | HR 58 | Ht 70.0 in | Wt 181.0 lb

## 2020-06-22 DIAGNOSIS — R32 Unspecified urinary incontinence: Secondary | ICD-10-CM

## 2020-06-22 DIAGNOSIS — R109 Unspecified abdominal pain: Secondary | ICD-10-CM | POA: Diagnosis not present

## 2020-06-22 DIAGNOSIS — R351 Nocturia: Secondary | ICD-10-CM | POA: Diagnosis not present

## 2020-06-22 LAB — BLADDER SCAN AMB NON-IMAGING

## 2020-06-22 MED ORDER — MIRABEGRON ER 25 MG PO TB24: 25.0000 mg | ORAL_TABLET | Freq: Every day | ORAL | 0 refills | Status: DC

## 2020-06-22 NOTE — Patient Instructions (Addendum)
CONTINUE finasteride. STOP Flomax (tamsulosin). START Myrbetriq (mirabegron).  I would like you to have an ultrasound done on your kidneys to see if they could be the source of your pain. If the mobile unit that comes to your home can perform this and send me the images, then please have them do this. Otherwise, I can place an order to have it done at a [facility] facility. Please look into this and call our office to let me know how we will proceed.   Office: [phone] Fax: [phone]

## 2020-06-22 NOTE — Progress Notes (Signed)
06/22/2020 10:45 AM   Gavin Garcia August 12, 1927 [id number]  CC: Chief Complaint  Patient presents with  . Follow-up    HPI: Gavin Garcia is a 87 y.o. male with PMH BPH with nocturia on Flomax and finasteride and Parkinson's disease who presents today for symptom recheck and PVR.  He was seen in clinic most recently by Dr. Junious Silk for evaluation of his nocturia and urinary incontinence.  He was started on a trial of tamsulosin at that time, as he had previously been on finasteride monotherapy.  He is accompanied today by his caregiver, who contributes to HPI.  Today he reports nocturia x2-3.  He typically awakens to void during the night, however sometimes he leaks without awakening.  He does not leak every night.  He also reports daytime frequency, approximately every hour, without urinary leakage.  He denies urinary hesitancy, straining, or intermittency and feels he empties his bladder well.  He is most bothered by his nocturnal enuresis.  He has been taking Flomax and finasteride as prescribed, and notes no difference in his urinary symptoms with the addition of Flomax.  He has been tolerating this medication well without orthostasis.  Additionally, he reports bilateral flank pain with turning.  This is been ongoing for the past year.  He denies dysuria and flank pain.  He states his son has a history of nephrolithiasis, however he denies a personal history of nephrolithiasis  PVR 1mL.  PMH: Past Medical History:  Diagnosis Date  . Basal cell carcinoma   . Glaucoma (increased eye pressure)   . Osteoporosis   . Parkinson's disease ([place])   . Prostate enlargement     Surgical History: Past Surgical History:  Procedure Laterality Date  . NO PAST SURGERIES      Home Medications:  Allergies as of 06/22/2020      Reactions   Codeine Nausea And Vomiting      Medication List       Accurate as of June 22, 2020 10:45 AM. If you have any questions, ask your nurse or  doctor.        acetaminophen 325 MG tablet Commonly known as: TYLENOL Take 2 tablets (650 mg total) by mouth every 6 (six) hours as needed for mild pain (or Fever >/= 101).   aspirin 81 MG EC tablet Take 1 tablet (81 mg total) by mouth daily.   busPIRone 5 MG tablet Commonly known as: BUSPAR Take 5 mg by mouth 2 (two) times daily.   carbidopa-levodopa 25-250 MG tablet Commonly known as: SINEMET IR Take 1 tablet by mouth 3 (three) times daily. What changed: when to take this   diclofenac Sodium 1 % Gel Commonly known as: VOLTAREN Apply topically 4 (four) times daily.   docusate sodium 100 MG capsule Commonly known as: COLACE Take 100 mg by mouth daily as needed for mild constipation.   dorzolamide-timolol 22.3-6.8 MG/ML ophthalmic solution Commonly known as: COSOPT Place 1 drop into the left eye 2 (two) times daily.   doxycycline 50 MG tablet Commonly known as: ADOXA 2 (two) times daily.   finasteride 5 MG tablet Commonly known as: PROSCAR Take 1 tablet (5 mg total) by mouth daily.   Hydrocortisone Acetate 1 % Crea 4 (four) times daily as needed.   latanoprost 0.005 % ophthalmic solution Commonly known as: XALATAN Place 1 drop into both eyes at bedtime.   magnesium hydroxide 400 MG/5ML suspension Commonly known as: MILK OF MAGNESIA Take by mouth every 4 (four) hours  as needed.   meclizine 25 MG tablet Commonly known as: ANTIVERT Take 25 mg by mouth 3 (three) times daily as needed.   naproxen 250 MG tablet Commonly known as: NAPROSYN Take 250 mg by mouth 2 (two) times daily with a meal.   polyethylene glycol powder 17 GM/SCOOP powder Commonly known as: GLYCOLAX/MIRALAX Take 17 g by mouth daily.   Refresh 1.4-0.6 % Soln Generic drug: Polyvinyl Alcohol-Povidone PF Apply to eye 2 (two) times daily as needed.   rivastigmine 1.5 MG capsule Commonly known as: EXELON Take 1.5 mg by mouth 2 (two) times daily.   tamsulosin 0.4 MG Caps capsule Commonly  known as: FLOMAX Take 1 capsule (0.4 mg total) by mouth daily after supper.       Allergies:  Allergies  Allergen Reactions  . Codeine Nausea And Vomiting    Family History: No family history on file.  Social History:   reports that he has never smoked. He has never used smokeless tobacco. He reports that he does not drink alcohol and does not use drugs.  Physical Exam: BP (!) 147/73   Pulse (!) 58   Ht 5\' 10"  (1.778 m)   Wt 181 lb (82.1 kg)   BMI 25.97 kg/m   Constitutional:  Alert and oriented, no acute distress, nontoxic appearing HEENT: [place], AT Cardiovascular: No clubbing, cyanosis, or edema Respiratory: Normal respiratory effort, no increased work of breathing Skin: No rashes, bruises or suspicious lesions Neurologic: Grossly intact, no focal deficits, moving all 4 extremities Psychiatric: Normal mood and affect  Laboratory Data: Results for orders placed or performed in visit on 06/22/20  Bladder Scan (Post Void Residual) in office  Result Value Ref Range   Scan Result 84mL    Assessment & Plan:   1. Nocturia Associated with nocturnal enuresis and daytime frequency.  Not improved with the addition of Flomax.  Stop Flomax.  Recommend a trial of Myrbetriq 25 mg daily, samples provided today.  We will plan for symptom recheck and PVR in 4 weeks.  Anticholinergics contraindicated in this patient due to age and history of constipation. - Bladder Scan (Post Void Residual) in office - mirabegron ER (MYRBETRIQ) 25 MG TB24 tablet; Take 1 tablet (25 mg total) by mouth daily.  Dispense: 28 tablet; Refill: 0   2. Flank pain Bilateral and exacerbated by positional changes, likely MSK in origin.  Will obtain renal ultrasound to rule out hydronephrosis.  There is a mobile unit that goes to the patient's facility that can perform these and I explained that I am okay with them performing the renal ultrasound so long as I am able to personally review the images.  Caregiver to look  into this.  If imaging is not available for my personal review, will order renal ultrasound within the [facility] system.  Return in about 4 weeks (around 07/20/2020) for Symptom recheck with PVR, IPSS.  Debroah Loop, PA-C  [facility] [address] [place], [place] [address] [phone]

## 2020-06-27 DIAGNOSIS — Z20828 Contact with and (suspected) exposure to other viral communicable diseases: Secondary | ICD-10-CM | POA: Diagnosis not present

## 2020-07-03 DIAGNOSIS — R32 Unspecified urinary incontinence: Secondary | ICD-10-CM | POA: Diagnosis not present

## 2020-07-04 DIAGNOSIS — Z20828 Contact with and (suspected) exposure to other viral communicable diseases: Secondary | ICD-10-CM | POA: Diagnosis not present

## 2020-07-09 DIAGNOSIS — Z85828 Personal history of other malignant neoplasm of skin: Secondary | ICD-10-CM | POA: Diagnosis not present

## 2020-07-09 DIAGNOSIS — H401112 Primary open-angle glaucoma, right eye, moderate stage: Secondary | ICD-10-CM | POA: Diagnosis not present

## 2020-07-09 DIAGNOSIS — L821 Other seborrheic keratosis: Secondary | ICD-10-CM | POA: Diagnosis not present

## 2020-07-09 DIAGNOSIS — Z85820 Personal history of malignant melanoma of skin: Secondary | ICD-10-CM | POA: Diagnosis not present

## 2020-07-09 DIAGNOSIS — L814 Other melanin hyperpigmentation: Secondary | ICD-10-CM | POA: Diagnosis not present

## 2020-07-09 DIAGNOSIS — D692 Other nonthrombocytopenic purpura: Secondary | ICD-10-CM | POA: Diagnosis not present

## 2020-07-09 DIAGNOSIS — D229 Melanocytic nevi, unspecified: Secondary | ICD-10-CM | POA: Diagnosis not present

## 2020-07-09 DIAGNOSIS — L570 Actinic keratosis: Secondary | ICD-10-CM | POA: Diagnosis not present

## 2020-07-11 DIAGNOSIS — Z20828 Contact with and (suspected) exposure to other viral communicable diseases: Secondary | ICD-10-CM | POA: Diagnosis not present

## 2020-07-16 DIAGNOSIS — G20 Parkinson's disease: Secondary | ICD-10-CM | POA: Diagnosis not present

## 2020-07-16 DIAGNOSIS — F0280 Dementia in other diseases classified elsewhere without behavioral disturbance: Secondary | ICD-10-CM | POA: Diagnosis not present

## 2020-07-16 DIAGNOSIS — I779 Disorder of arteries and arterioles, unspecified: Secondary | ICD-10-CM | POA: Diagnosis not present

## 2020-07-16 DIAGNOSIS — M48061 Spinal stenosis, lumbar region without neurogenic claudication: Secondary | ICD-10-CM | POA: Diagnosis not present

## 2020-07-16 DIAGNOSIS — Z789 Other specified health status: Secondary | ICD-10-CM | POA: Diagnosis not present

## 2020-07-18 DIAGNOSIS — Z20828 Contact with and (suspected) exposure to other viral communicable diseases: Secondary | ICD-10-CM | POA: Diagnosis not present

## 2020-07-20 ENCOUNTER — Encounter: Payer: Self-pay | Admitting: Physician Assistant

## 2020-07-20 ENCOUNTER — Ambulatory Visit (INDEPENDENT_AMBULATORY_CARE_PROVIDER_SITE_OTHER): Payer: Medicare HMO | Admitting: Physician Assistant

## 2020-07-20 ENCOUNTER — Other Ambulatory Visit: Payer: Self-pay

## 2020-07-20 VITALS — BP 114/65 | HR 80 | Ht 70.0 in | Wt 181.0 lb

## 2020-07-20 DIAGNOSIS — R351 Nocturia: Secondary | ICD-10-CM | POA: Diagnosis not present

## 2020-07-20 DIAGNOSIS — R109 Unspecified abdominal pain: Secondary | ICD-10-CM

## 2020-07-20 LAB — BLADDER SCAN AMB NON-IMAGING: Scan Result: 101

## 2020-07-20 MED ORDER — TAMSULOSIN HCL 0.4 MG PO CAPS: 0.4000 mg | ORAL_CAPSULE | Freq: Every day | ORAL | 0 refills | Status: DC

## 2020-07-20 MED ORDER — MIRABEGRON ER 25 MG PO TB24: 25.0000 mg | ORAL_TABLET | Freq: Every day | ORAL | 2 refills | Status: DC

## 2020-07-20 NOTE — Patient Instructions (Signed)
CONTINUE finasteride. CONTINUE Myrbetriq. RESTART Flomax.

## 2020-07-20 NOTE — Progress Notes (Signed)
07/20/2020 10:18 AM   Gavin Garcia Mar 31, 1928 [id number]  CC: Chief Complaint  Patient presents with  . Follow-up  . Benign Prostatic Hypertrophy  . Enuresis   HPI: Gavin Garcia is a 87 y.o. male with BPH with frequency, nocturia, and nocturnal enuresis on finasteride; Parkinson's disease; and bilateral flank pain with positional exacerbation who presents today for symptom recheck and PVR on Myrbetriq 25 mg daily.  I previously discontinued Flomax due to inadequate therapeutic response and requested that he obtain a renal ultrasound at his facility to rule out nephrolithiasis. He is accompanied today by his caregiver, who contributes to HPI.  Today he reports significantly decreased daytime frequency x3-4 (previously hourly), slightly decreased nocturia x2-3 (previously x2-3), and increased nocturnal enuresis x1-2 nightly (previously less than nightly). Additionally, he reports his flank pain has improved on Myrbetriq. IPSS 11/2 as below.  PVR 101 mL.  Additionally, patient underwent renal ultrasound with the mobile unit at his facility as requested, however we have not received the radiology report or original images to review this.   IPSS    Row Name 07/20/20 1000         International Prostate Symptom Score   How often have you had the sensation of not emptying your bladder? Less than half the time     How often have you had to urinate less than every two hours? Less than half the time     How often have you found you stopped and started again several times when you urinated? Less than 1 in 5 times     How often have you found it difficult to postpone urination? Less than 1 in 5 times     How often have you had a weak urinary stream? Less than 1 in 5 times     How often have you had to strain to start urination? Less than half the time     How many times did you typically get up at night to urinate? 2 Times     Total IPSS Score 11           Quality of Life due to  urinary symptoms   If you were to spend the rest of your life with your urinary condition just the way it is now how would you feel about that? Mostly Satisfied             PMH: Past Medical History:  Diagnosis Date  . Basal cell carcinoma   . Glaucoma (increased eye pressure)   . Osteoporosis   . Parkinson's disease ([place])   . Prostate enlargement     Surgical History: Past Surgical History:  Procedure Laterality Date  . NO PAST SURGERIES      Home Medications:  Allergies as of 07/20/2020      Reactions   Codeine Nausea And Vomiting      Medication List       Accurate as of July 20, 2020 10:18 AM. If you have any questions, ask your nurse or doctor.        acetaminophen 325 MG tablet Commonly known as: TYLENOL Take 2 tablets (650 mg total) by mouth every 6 (six) hours as needed for mild pain (or Fever >/= 101).   aspirin 81 MG EC tablet Take 1 tablet (81 mg total) by mouth daily.   brimonidine 0.2 % ophthalmic solution Commonly known as: ALPHAGAN Place 1 drop into the left eye 2 (two) times daily.   busPIRone 5 MG  tablet Commonly known as: BUSPAR Take 5 mg by mouth 2 (two) times daily.   carbidopa-levodopa 25-250 MG tablet Commonly known as: SINEMET IR Take 1 tablet by mouth 3 (three) times daily. What changed: when to take this   diclofenac Sodium 1 % Gel Commonly known as: VOLTAREN Apply topically 4 (four) times daily.   docusate sodium 100 MG capsule Commonly known as: COLACE Take 100 mg by mouth daily as needed for mild constipation.   dorzolamide-timolol 22.3-6.8 MG/ML ophthalmic solution Commonly known as: COSOPT Place 1 drop into the left eye 2 (two) times daily.   doxycycline 50 MG tablet Commonly known as: ADOXA 2 (two) times daily.   finasteride 5 MG tablet Commonly known as: PROSCAR Take 1 tablet (5 mg total) by mouth daily.   Hydrocortisone Acetate 1 % Crea 4 (four) times daily as needed.   latanoprost 0.005 % ophthalmic  solution Commonly known as: XALATAN Place 1 drop into both eyes at bedtime.   magnesium hydroxide 400 MG/5ML suspension Commonly known as: MILK OF MAGNESIA Take by mouth every 4 (four) hours as needed.   meclizine 25 MG tablet Commonly known as: ANTIVERT Take 25 mg by mouth 3 (three) times daily as needed.   mirabegron ER 25 MG Tb24 tablet Commonly known as: MYRBETRIQ Take 1 tablet (25 mg total) by mouth daily.   naproxen 250 MG tablet Commonly known as: NAPROSYN Take 250 mg by mouth 2 (two) times daily with a meal.   polyethylene glycol powder 17 GM/SCOOP powder Commonly known as: GLYCOLAX/MIRALAX Take 17 g by mouth daily.   Refresh 1.4-0.6 % Soln Generic drug: Polyvinyl Alcohol-Povidone PF Apply to eye 2 (two) times daily as needed.   rivastigmine 1.5 MG capsule Commonly known as: EXELON Take 1.5 mg by mouth 2 (two) times daily.       Allergies:  Allergies  Allergen Reactions  . Codeine Nausea And Vomiting    Family History: No family history on file.  Social History:   reports that he has never smoked. He has never used smokeless tobacco. He reports that he does not drink alcohol and does not use drugs.  Physical Exam: BP 114/65   Pulse 80   Ht 5\' 10"  (1.778 m)   Wt 181 lb (82.1 kg)   BMI 25.97 kg/m   Constitutional:  Alert, no acute distress, nontoxic appearing HEENT: [place], AT Cardiovascular: No clubbing, cyanosis, or edema Respiratory: Normal respiratory effort, no increased work of breathing Skin: No rashes, bruises or suspicious lesions Neurologic: Grossly intact, no focal deficits, moving all 4 extremities Psychiatric: Normal mood and affect  Laboratory Data: Results for orders placed or performed in visit on 07/20/20  Bladder Scan (Post Void Residual) in office  Result Value Ref Range   Scan Result 101    Assessment & Plan:   1. Nocturia Improved daytime symptoms with stable to worsening nighttime symptoms after stopping Flomax and starting  Myrbetriq 25 mg. Additionally, his bladder emptying is stable to slightly worsened, though still within normal limits.  Recommend restarting Flomax and continuing Myrbetriq and finasteride. We will plan for symptom recheck with PVR and IPSS in 4 weeks.  Ultimately, if his symptoms are not improved on triple therapy, recommend consideration of a condom catheter to decrease bother from his nocturnal enuresis, which remains his most bothersome symptom today. - Bladder Scan (Post Void Residual) in office  2. Flank pain Improved on Myrbetriq, though I suspect this improvement is coincidental. Unable to review renal ultrasound, staff  at his facility will continue to try to get this faxed to [place].  Return in about 4 weeks (around 08/17/2020) for Symptom recheck with PVR and IPSS.  Debroah Loop, PA-C  [facility] [address] [place], [place] [address] [phone]

## 2020-07-25 DIAGNOSIS — Z0000 Encounter for general adult medical examination without abnormal findings: Secondary | ICD-10-CM | POA: Diagnosis not present

## 2020-08-17 ENCOUNTER — Other Ambulatory Visit: Payer: Self-pay

## 2020-08-17 ENCOUNTER — Ambulatory Visit (INDEPENDENT_AMBULATORY_CARE_PROVIDER_SITE_OTHER): Payer: Medicare HMO | Admitting: Physician Assistant

## 2020-08-17 ENCOUNTER — Encounter: Payer: Self-pay | Admitting: Physician Assistant

## 2020-08-17 VITALS — BP 169/78 | HR 60 | Ht 70.0 in | Wt 181.0 lb

## 2020-08-17 DIAGNOSIS — R32 Unspecified urinary incontinence: Secondary | ICD-10-CM | POA: Diagnosis not present

## 2020-08-17 LAB — BLADDER SCAN AMB NON-IMAGING: Scan Result: 0

## 2020-08-17 NOTE — Progress Notes (Signed)
08/17/2020 11:23 AM   Gavin Garcia 1927-07-04 [id number]  CC: Chief Complaint  Patient presents with  . Follow-up    72mth follow-up    HPI: Gavin Garcia is a 87 y.o. male with BPH with frequency, nocturia, and nocturnal enuresis on finasteride; Parkinson's disease; and bilateral flank pain with positional exacerbation who presents today for symptom recheck and PVR on Flomax and Myrbetriq 25 mg.  IPSS 11/mixed as below, previously 11/mostly satisfied.  PVR 0 mL, previously 101 mL before starting Flomax..  I was able to personally review his renal ultrasound and radiology report.  He has bilateral renal cysts, simple in appearance, without hydronephrosis or shadowing stones.  Today he reports stable urinary symptoms on Flomax, finasteride, and Myrbetriq.  He reports daytime frequency x3 (baseline hourly), nocturia x3 (baseline x2-3), and nocturnal enuresis x2-3 (baseline x<1).  His caregiver interjects that he had reported to her more nighttime leakage because he did not wish to get out of bed in the middle of the night now that he has been wearing absorbent diapers to bed.  Patient can states that this is true.  He remains mostly bothered by his nighttime leakage.   IPSS    Row Name 08/17/20 1100         International Prostate Symptom Score   How often have you had the sensation of not emptying your bladder? Less than half the time     How often have you had to urinate less than every two hours? Less than 1 in 5 times     How often have you found you stopped and started again several times when you urinated? Not at All     How often have you found it difficult to postpone urination? Less than half the time     How often have you had a weak urinary stream? Less than 1 in 5 times     How often have you had to strain to start urination? About half the time     How many times did you typically get up at night to urinate? 2 Times     Total IPSS Score 11           Quality of  Life due to urinary symptoms   If you were to spend the rest of your life with your urinary condition just the way it is now how would you feel about that? Mixed            PMH: Past Medical History:  Diagnosis Date  . Basal cell carcinoma   . Glaucoma (increased eye pressure)   . Osteoporosis   . Parkinson's disease ([place])   . Prostate enlargement     Surgical History: Past Surgical History:  Procedure Laterality Date  . NO PAST SURGERIES      Home Medications:  Allergies as of 08/17/2020      Reactions   Codeine Nausea And Vomiting      Medication List       Accurate as of August 17, 2020 11:23 AM. If you have any questions, ask your nurse or doctor.        acetaminophen 325 MG tablet Commonly known as: TYLENOL Take 2 tablets (650 mg total) by mouth every 6 (six) hours as needed for mild pain (or Fever >/= 101).   aspirin 81 MG EC tablet Take 1 tablet (81 mg total) by mouth daily.   brimonidine 0.2 % ophthalmic solution Commonly known as: ALPHAGAN Place 1  drop into the left eye 2 (two) times daily.   busPIRone 5 MG tablet Commonly known as: BUSPAR Take 5 mg by mouth 2 (two) times daily.   carbidopa-levodopa 25-250 MG tablet Commonly known as: SINEMET IR Take 1 tablet by mouth 3 (three) times daily.   diclofenac Sodium 1 % Gel Commonly known as: VOLTAREN Apply topically 4 (four) times daily.   docusate sodium 100 MG capsule Commonly known as: COLACE Take 100 mg by mouth daily as needed for mild constipation.   dorzolamide-timolol 22.3-6.8 MG/ML ophthalmic solution Commonly known as: COSOPT Place 1 drop into the left eye 2 (two) times daily.   doxycycline 50 MG tablet Commonly known as: ADOXA 2 (two) times daily.   finasteride 5 MG tablet Commonly known as: PROSCAR Take 1 tablet (5 mg total) by mouth daily.   Hydrocortisone Acetate 1 % Crea 4 (four) times daily as needed.   latanoprost 0.005 % ophthalmic solution Commonly known as:  XALATAN Place 1 drop into both eyes at bedtime.   magnesium hydroxide 400 MG/5ML suspension Commonly known as: MILK OF MAGNESIA Take by mouth every 4 (four) hours as needed.   meclizine 25 MG tablet Commonly known as: ANTIVERT Take 25 mg by mouth 3 (three) times daily as needed.   mirabegron ER 25 MG Tb24 tablet Commonly known as: MYRBETRIQ Take 1 tablet (25 mg total) by mouth daily.   naproxen 250 MG tablet Commonly known as: NAPROSYN Take 250 mg by mouth 2 (two) times daily with a meal.   polyethylene glycol powder 17 GM/SCOOP powder Commonly known as: GLYCOLAX/MIRALAX Take 17 g by mouth daily.   Refresh 1.4-0.6 % Soln Generic drug: Polyvinyl Alcohol-Povidone PF Apply to eye 2 (two) times daily as needed.   rivastigmine 1.5 MG capsule Commonly known as: EXELON Take 1.5 mg by mouth 2 (two) times daily.   tamsulosin 0.4 MG Caps capsule Commonly known as: FLOMAX Take 1 capsule (0.4 mg total) by mouth daily.       Allergies:  Allergies  Allergen Reactions  . Codeine Nausea And Vomiting    Family History: No family history on file.  Social History:   reports that he has never smoked. He has never used smokeless tobacco. He reports that he does not drink alcohol and does not use drugs.  Physical Exam: BP (!) 169/78   Pulse 60   Ht 5\' 10"  (1.778 m)   Wt 181 lb (82.1 kg)   BMI 25.97 kg/m   Constitutional:  Alert and oriented, no acute distress, nontoxic appearing HEENT: [place], AT Cardiovascular: No clubbing, cyanosis, or edema Respiratory: Normal respiratory effort, no increased work of breathing GU: Uncircumcised phallus Skin: No rashes, bruises or suspicious lesions Neurologic: Grossly intact, no focal deficits, moving all 4 extremities Psychiatric: Normal mood and affect  Laboratory Data: Results for orders placed or performed in visit on 08/17/20  BLADDER SCAN AMB NON-IMAGING  Result Value Ref Range   Scan Result 0 ml    Assessment & Plan:   1.  Urinary incontinence, unspecified type Symptoms and emptying improved over baseline on triple therapy with Flomax, finasteride, and Myrbetriq 25 mg daily.  Counseled him to continue these.  Renal ultrasound reassuring for obstructive pathologies contributeing to his symptoms/flank pain.  For his residual nighttime leakage, I recommended continuing nighttime absorbent underwear versus consideration of condom cath.  I demonstrated condom catheter application today and he would like to try this.  I explained that he could use this overnight or around-the-clock  which exchange the catheter daily.  I measured him for a 28 regular sized Coloplast condom cath.  Coloplast order placed today. - BLADDER SCAN AMB NON-IMAGING   Return in about 6 months (around 02/17/2021) for Symptom recheck with PVR.  Debroah Loop, PA-C  [facility] [address], [place], [place] [address] [phone]

## 2020-08-29 DIAGNOSIS — R32 Unspecified urinary incontinence: Secondary | ICD-10-CM | POA: Diagnosis not present

## 2020-08-31 DIAGNOSIS — H401123 Primary open-angle glaucoma, left eye, severe stage: Secondary | ICD-10-CM | POA: Diagnosis not present

## 2020-09-03 DIAGNOSIS — Z85820 Personal history of malignant melanoma of skin: Secondary | ICD-10-CM | POA: Diagnosis not present

## 2020-09-03 DIAGNOSIS — D1800 Hemangioma unspecified site: Secondary | ICD-10-CM | POA: Diagnosis not present

## 2020-09-03 DIAGNOSIS — D229 Melanocytic nevi, unspecified: Secondary | ICD-10-CM | POA: Diagnosis not present

## 2020-09-03 DIAGNOSIS — L814 Other melanin hyperpigmentation: Secondary | ICD-10-CM | POA: Diagnosis not present

## 2020-09-03 DIAGNOSIS — L570 Actinic keratosis: Secondary | ICD-10-CM | POA: Diagnosis not present

## 2020-09-03 DIAGNOSIS — Z85828 Personal history of other malignant neoplasm of skin: Secondary | ICD-10-CM | POA: Diagnosis not present

## 2020-09-03 DIAGNOSIS — L821 Other seborrheic keratosis: Secondary | ICD-10-CM | POA: Diagnosis not present

## 2020-09-05 DIAGNOSIS — M2042 Other hammer toe(s) (acquired), left foot: Secondary | ICD-10-CM | POA: Diagnosis not present

## 2020-09-05 DIAGNOSIS — M2041 Other hammer toe(s) (acquired), right foot: Secondary | ICD-10-CM | POA: Diagnosis not present

## 2020-09-05 DIAGNOSIS — I872 Venous insufficiency (chronic) (peripheral): Secondary | ICD-10-CM | POA: Diagnosis not present

## 2020-09-11 DIAGNOSIS — I10 Essential (primary) hypertension: Secondary | ICD-10-CM | POA: Diagnosis not present

## 2020-09-11 DIAGNOSIS — G20 Parkinson's disease: Secondary | ICD-10-CM | POA: Diagnosis not present

## 2020-09-11 DIAGNOSIS — F0280 Dementia in other diseases classified elsewhere without behavioral disturbance: Secondary | ICD-10-CM | POA: Diagnosis not present

## 2020-09-11 DIAGNOSIS — Z789 Other specified health status: Secondary | ICD-10-CM | POA: Diagnosis not present

## 2020-09-11 DIAGNOSIS — I779 Disorder of arteries and arterioles, unspecified: Secondary | ICD-10-CM | POA: Diagnosis not present

## 2020-10-23 DIAGNOSIS — F0280 Dementia in other diseases classified elsewhere without behavioral disturbance: Secondary | ICD-10-CM | POA: Diagnosis not present

## 2020-10-23 DIAGNOSIS — Z789 Other specified health status: Secondary | ICD-10-CM | POA: Diagnosis not present

## 2020-10-23 DIAGNOSIS — I779 Disorder of arteries and arterioles, unspecified: Secondary | ICD-10-CM | POA: Diagnosis not present

## 2020-10-23 DIAGNOSIS — M48061 Spinal stenosis, lumbar region without neurogenic claudication: Secondary | ICD-10-CM | POA: Diagnosis not present

## 2020-10-23 DIAGNOSIS — G20 Parkinson's disease: Secondary | ICD-10-CM | POA: Diagnosis not present

## 2020-11-13 DIAGNOSIS — J208 Acute bronchitis due to other specified organisms: Secondary | ICD-10-CM | POA: Diagnosis not present

## 2020-11-13 DIAGNOSIS — Z20828 Contact with and (suspected) exposure to other viral communicable diseases: Secondary | ICD-10-CM | POA: Diagnosis not present

## 2020-11-13 DIAGNOSIS — U071 COVID-19: Secondary | ICD-10-CM | POA: Diagnosis not present

## 2020-12-31 DIAGNOSIS — H401112 Primary open-angle glaucoma, right eye, moderate stage: Secondary | ICD-10-CM | POA: Diagnosis not present

## 2020-12-31 DIAGNOSIS — Z0100 Encounter for examination of eyes and vision without abnormal findings: Secondary | ICD-10-CM | POA: Diagnosis not present

## 2021-01-02 DIAGNOSIS — G20 Parkinson's disease: Secondary | ICD-10-CM | POA: Diagnosis not present

## 2021-01-02 DIAGNOSIS — Z789 Other specified health status: Secondary | ICD-10-CM | POA: Diagnosis not present

## 2021-01-02 DIAGNOSIS — I10 Essential (primary) hypertension: Secondary | ICD-10-CM | POA: Diagnosis not present

## 2021-01-02 DIAGNOSIS — I779 Disorder of arteries and arterioles, unspecified: Secondary | ICD-10-CM | POA: Diagnosis not present

## 2021-01-02 DIAGNOSIS — F0280 Dementia in other diseases classified elsewhere without behavioral disturbance: Secondary | ICD-10-CM | POA: Diagnosis not present

## 2021-02-04 DIAGNOSIS — Z85820 Personal history of malignant melanoma of skin: Secondary | ICD-10-CM | POA: Diagnosis not present

## 2021-02-04 DIAGNOSIS — L570 Actinic keratosis: Secondary | ICD-10-CM | POA: Diagnosis not present

## 2021-02-04 DIAGNOSIS — Z85828 Personal history of other malignant neoplasm of skin: Secondary | ICD-10-CM | POA: Diagnosis not present

## 2021-02-20 ENCOUNTER — Encounter: Payer: Self-pay | Admitting: Physician Assistant

## 2021-02-20 ENCOUNTER — Ambulatory Visit (INDEPENDENT_AMBULATORY_CARE_PROVIDER_SITE_OTHER): Payer: Medicare HMO | Admitting: Physician Assistant

## 2021-02-20 ENCOUNTER — Other Ambulatory Visit: Payer: Self-pay

## 2021-02-20 VITALS — BP 162/74 | HR 66 | Ht 71.0 in | Wt 155.0 lb

## 2021-02-20 DIAGNOSIS — R351 Nocturia: Secondary | ICD-10-CM

## 2021-02-20 DIAGNOSIS — N3944 Nocturnal enuresis: Secondary | ICD-10-CM

## 2021-02-20 DIAGNOSIS — R350 Frequency of micturition: Secondary | ICD-10-CM | POA: Diagnosis not present

## 2021-02-20 DIAGNOSIS — N401 Enlarged prostate with lower urinary tract symptoms: Secondary | ICD-10-CM

## 2021-02-20 DIAGNOSIS — R32 Unspecified urinary incontinence: Secondary | ICD-10-CM

## 2021-02-20 LAB — BLADDER SCAN AMB NON-IMAGING

## 2021-02-20 MED ORDER — MIRABEGRON ER 25 MG PO TB24: 25.0000 mg | ORAL_TABLET | Freq: Every day | ORAL | 11 refills | Status: DC

## 2021-02-20 MED ORDER — TAMSULOSIN HCL 0.4 MG PO CAPS: 0.4000 mg | ORAL_CAPSULE | Freq: Every day | ORAL | 11 refills | Status: DC

## 2021-02-20 MED ORDER — FINASTERIDE 5 MG PO TABS: 5.0000 mg | ORAL_TABLET | Freq: Every day | ORAL | 11 refills | Status: DC

## 2021-02-20 NOTE — Progress Notes (Signed)
02/20/2021 11:35 AM   Gavin Garcia 04-24-1928 [id number]  CC: Chief Complaint  Patient presents with   Urinary Incontinence    39mofollow up   HPI: Gavin Garcia a 87y.o. male with PMH BPH with frequency, nocturia, and nocturnal enuresis on Flomax, finasteride, and Myrbetriq 25 mg daily; Parkinson's disease; and bilateral flank pain with positional exacerbation who presents today for symptom recheck.   Today he reports overall being pleased with his current drug regimen.  He wears pull-ups during the day for breakthrough leakage and has been using a condom catheter at night.  He states occasionally the condom catheters will leak, but overall he is pleased.  PVR 145 mL.  PMH: Past Medical History:  Diagnosis Date   Basal cell carcinoma    Glaucoma (increased eye pressure)    Osteoporosis    Parkinson's disease ([place]    Prostate enlargement     Surgical History: Past Surgical History:  Procedure Laterality Date   NO PAST SURGERIES      Home Medications:  Allergies as of 02/20/2021       Reactions   Codeine Nausea And Vomiting        Medication List        Accurate as of February 20, 2021 11:35 AM. If you have any questions, ask your nurse or doctor.          acetaminophen 325 MG tablet Commonly known as: TYLENOL Take 2 tablets (650 mg total) by mouth every 6 (six) hours as needed for mild pain (or Fever >/= 101).   aspirin 81 MG EC tablet Take 1 tablet (81 mg total) by mouth daily.   brimonidine 0.2 % ophthalmic solution Commonly known as: ALPHAGAN Place 1 drop into the left eye 2 (two) times daily.   busPIRone 5 MG tablet Commonly known as: BUSPAR Take 5 mg by mouth 2 (two) times daily.   carbidopa-levodopa 25-250 MG tablet Commonly known as: SINEMET IR Take 1 tablet by mouth 3 (three) times daily.   diclofenac Sodium 1 % Gel Commonly known as: VOLTAREN Apply topically 4 (four) times daily.   docusate sodium 100 MG capsule Commonly  known as: COLACE Take 100 mg by mouth daily as needed for mild constipation.   dorzolamide-timolol 22.3-6.8 MG/ML ophthalmic solution Commonly known as: COSOPT Place 1 drop into the left eye 2 (two) times daily.   doxycycline 50 MG tablet Commonly known as: ADOXA 2 (two) times daily.   finasteride 5 MG tablet Commonly known as: PROSCAR Take 1 tablet (5 mg total) by mouth daily.   Hydrocortisone Acetate 1 % Crea 4 (four) times daily as needed.   latanoprost 0.005 % ophthalmic solution Commonly known as: XALATAN Place 1 drop into both eyes at bedtime.   magnesium hydroxide 400 MG/5ML suspension Commonly known as: MILK OF MAGNESIA Take by mouth every 4 (four) hours as needed.   meclizine 25 MG tablet Commonly known as: ANTIVERT Take 25 mg by mouth 3 (three) times daily as needed.   mirabegron ER 25 MG Tb24 tablet Commonly known as: MYRBETRIQ Take 1 tablet (25 mg total) by mouth daily.   naproxen 250 MG tablet Commonly known as: NAPROSYN Take 250 mg by mouth 2 (two) times daily with a meal.   polyethylene glycol powder 17 GM/SCOOP powder Commonly known as: GLYCOLAX/MIRALAX Take 17 g by mouth daily.   Refresh 1.4-0.6 % Soln Generic drug: Polyvinyl Alcohol-Povidone PF Apply to eye 2 (two) times daily as needed.  rivastigmine 1.5 MG capsule Commonly known as: EXELON Take 1.5 mg by mouth 2 (two) times daily.   tamsulosin 0.4 MG Caps capsule Commonly known as: FLOMAX Take 1 capsule (0.4 mg total) by mouth daily.        Allergies:  Allergies  Allergen Reactions   Codeine Nausea And Vomiting    Family History: No family history on file.  Social History:   reports that he has never smoked. He has never used smokeless tobacco. He reports that he does not drink alcohol and does not use drugs.  Physical Exam: BP (!) 162/74   Pulse 66   Ht '5\' 11"'$  (1.803 m)   Wt 155 lb (70.3 kg) Comment: patient reports*wheel chair  BMI 21.62 kg/m   Constitutional:  Alert  and oriented, no acute distress, nontoxic appearing HEENT: [place], AT Cardiovascular: No clubbing, cyanosis, or edema Respiratory: Normal respiratory effort, no increased work of breathing Skin: No rashes, bruises or suspicious lesions Neurologic: Grossly intact, no focal deficits, moving all 4 extremities Psychiatric: Normal mood and affect  Laboratory Data: Results for orders placed or performed in visit on 02/20/21  BLADDER SCAN AMB NON-IMAGING  Result Value Ref Range   Scan Result 140m    Assessment & Plan:   1. Benign prostatic hyperplasia with urinary frequency Emptying appropriately, patient well controlled on triple therapy.  No acute concerns today.  We will plan to continue these medications and see him back in 1 year. - BLADDER SCAN AMB NON-IMAGING - finasteride (PROSCAR) 5 MG tablet; Take 1 tablet (5 mg total) by mouth daily.  Dispense: 30 tablet; Refill: 11 - mirabegron ER (MYRBETRIQ) 25 MG TB24 tablet; Take 1 tablet (25 mg total) by mouth daily.  Dispense: 30 tablet; Refill: 11 - tamsulosin (FLOMAX) 0.4 MG CAPS capsule; Take 1 capsule (0.4 mg total) by mouth daily.  Dispense: 30 capsule; Refill: 11  2. Nocturnal enuresis Continue condom catheters at night for management of urinary leakage.  Return in about 1 year (around 02/20/2022) for Symptom recheck with PVR.  SDebroah Loop PA-C  [facility] [address] [place]  [phone]

## 2021-03-02 DIAGNOSIS — R32 Unspecified urinary incontinence: Secondary | ICD-10-CM | POA: Diagnosis not present

## 2021-03-11 DIAGNOSIS — L814 Other melanin hyperpigmentation: Secondary | ICD-10-CM | POA: Diagnosis not present

## 2021-03-11 DIAGNOSIS — Z85820 Personal history of malignant melanoma of skin: Secondary | ICD-10-CM | POA: Diagnosis not present

## 2021-03-11 DIAGNOSIS — L821 Other seborrheic keratosis: Secondary | ICD-10-CM | POA: Diagnosis not present

## 2021-03-11 DIAGNOSIS — L905 Scar conditions and fibrosis of skin: Secondary | ICD-10-CM | POA: Diagnosis not present

## 2021-03-11 DIAGNOSIS — L570 Actinic keratosis: Secondary | ICD-10-CM | POA: Diagnosis not present

## 2021-03-11 DIAGNOSIS — D229 Melanocytic nevi, unspecified: Secondary | ICD-10-CM | POA: Diagnosis not present

## 2021-03-11 DIAGNOSIS — Z85828 Personal history of other malignant neoplasm of skin: Secondary | ICD-10-CM | POA: Diagnosis not present

## 2021-03-11 DIAGNOSIS — D692 Other nonthrombocytopenic purpura: Secondary | ICD-10-CM | POA: Diagnosis not present

## 2021-03-11 DIAGNOSIS — D1800 Hemangioma unspecified site: Secondary | ICD-10-CM | POA: Diagnosis not present

## 2021-03-18 DIAGNOSIS — I779 Disorder of arteries and arterioles, unspecified: Secondary | ICD-10-CM | POA: Diagnosis not present

## 2021-03-18 DIAGNOSIS — Z789 Other specified health status: Secondary | ICD-10-CM | POA: Diagnosis not present

## 2021-03-18 DIAGNOSIS — F0280 Dementia in other diseases classified elsewhere without behavioral disturbance: Secondary | ICD-10-CM | POA: Diagnosis not present

## 2021-03-18 DIAGNOSIS — I10 Essential (primary) hypertension: Secondary | ICD-10-CM | POA: Diagnosis not present

## 2021-03-18 DIAGNOSIS — G20 Parkinson's disease: Secondary | ICD-10-CM | POA: Diagnosis not present

## 2021-04-04 DIAGNOSIS — L821 Other seborrheic keratosis: Secondary | ICD-10-CM | POA: Diagnosis not present

## 2021-04-04 DIAGNOSIS — L570 Actinic keratosis: Secondary | ICD-10-CM | POA: Diagnosis not present

## 2021-05-03 DIAGNOSIS — R300 Dysuria: Secondary | ICD-10-CM | POA: Diagnosis not present

## 2021-05-03 DIAGNOSIS — N390 Urinary tract infection, site not specified: Secondary | ICD-10-CM | POA: Diagnosis not present

## 2021-05-08 DIAGNOSIS — N390 Urinary tract infection, site not specified: Secondary | ICD-10-CM | POA: Diagnosis not present

## 2021-05-14 DIAGNOSIS — G20 Parkinson's disease: Secondary | ICD-10-CM | POA: Diagnosis not present

## 2021-05-14 DIAGNOSIS — F0280 Dementia in other diseases classified elsewhere without behavioral disturbance: Secondary | ICD-10-CM | POA: Diagnosis not present

## 2021-05-14 DIAGNOSIS — I779 Disorder of arteries and arterioles, unspecified: Secondary | ICD-10-CM | POA: Diagnosis not present

## 2021-05-14 DIAGNOSIS — Z789 Other specified health status: Secondary | ICD-10-CM | POA: Diagnosis not present

## 2021-05-29 ENCOUNTER — Encounter: Payer: Self-pay | Admitting: Emergency Medicine

## 2021-05-29 ENCOUNTER — Other Ambulatory Visit: Payer: Self-pay

## 2021-05-29 ENCOUNTER — Emergency Department
Admission: EM | Admit: 2021-05-29 | Discharge: 2021-05-29 | Disposition: A | Discharge status: Home / Self Care | Payer: Medicare HMO | Attending: Emergency Medicine | Admitting: Emergency Medicine

## 2021-05-29 DIAGNOSIS — Z7982 Long term (current) use of aspirin: Secondary | ICD-10-CM | POA: Insufficient documentation

## 2021-05-29 DIAGNOSIS — I959 Hypotension, unspecified: Secondary | ICD-10-CM | POA: Diagnosis not present

## 2021-05-29 DIAGNOSIS — Z743 Need for continuous supervision: Secondary | ICD-10-CM | POA: Diagnosis not present

## 2021-05-29 DIAGNOSIS — R5381 Other malaise: Secondary | ICD-10-CM | POA: Diagnosis not present

## 2021-05-29 DIAGNOSIS — R55 Syncope and collapse: Secondary | ICD-10-CM

## 2021-05-29 DIAGNOSIS — R42 Dizziness and giddiness: Secondary | ICD-10-CM | POA: Diagnosis not present

## 2021-05-29 DIAGNOSIS — F0280 Dementia in other diseases classified elsewhere without behavioral disturbance: Secondary | ICD-10-CM | POA: Insufficient documentation

## 2021-05-29 DIAGNOSIS — Z79899 Other long term (current) drug therapy: Secondary | ICD-10-CM | POA: Diagnosis not present

## 2021-05-29 DIAGNOSIS — R0902 Hypoxemia: Secondary | ICD-10-CM | POA: Diagnosis not present

## 2021-05-29 DIAGNOSIS — I10 Essential (primary) hypertension: Secondary | ICD-10-CM | POA: Diagnosis not present

## 2021-05-29 DIAGNOSIS — R000 Tachycardia, unspecified: Secondary | ICD-10-CM | POA: Diagnosis not present

## 2021-05-29 LAB — URINALYSIS, ROUTINE W REFLEX MICROSCOPIC
Bilirubin Urine: NEGATIVE
Glucose, UA: NEGATIVE mg/dL
Hgb urine dipstick: NEGATIVE
Ketones, ur: NEGATIVE mg/dL
Leukocytes,Ua: NEGATIVE
Nitrite: NEGATIVE
Protein, ur: NEGATIVE mg/dL
Specific Gravity, Urine: 1.02 (ref 1.005–1.030)
pH: 6.5 (ref 5.0–8.0)

## 2021-05-29 LAB — BASIC METABOLIC PANEL
Anion gap: 5 (ref 5–15)
BUN: 29 mg/dL — ABNORMAL HIGH (ref 8–23)
CO2: 26 mmol/L (ref 22–32)
Calcium: 8.3 mg/dL — ABNORMAL LOW (ref 8.9–10.3)
Chloride: 108 mmol/L (ref 98–111)
Creatinine, Ser: 0.84 mg/dL (ref 0.61–1.24)
GFR, Estimated: >60 mL/min (ref >60)
Glucose, Bld: 113 mg/dL — ABNORMAL HIGH (ref 70–99)
Potassium: 4.2 mmol/L (ref 3.5–5.1)
Sodium: 139 mmol/L (ref 135–145)

## 2021-05-29 LAB — CBC WITH DIFFERENTIAL/PLATELET
Abs Immature Granulocytes: 0.05 10*3/uL (ref 0.00–0.07)
Basophils Absolute: 0.1 10*3/uL (ref 0.0–0.1)
Basophils Relative: 1 %
Eosinophils Absolute: 0 10*3/uL (ref 0.0–0.5)
Eosinophils Relative: 1 %
HCT: 34.2 % — ABNORMAL LOW (ref 39.0–52.0)
Hemoglobin: 11 g/dL — ABNORMAL LOW (ref 13.0–17.0)
Immature Granulocytes: 1 %
Lymphocytes Relative: 25 %
Lymphs Abs: 2.1 10*3/uL (ref 0.7–4.0)
MCH: 31 pg (ref 26.0–34.0)
MCHC: 32.2 g/dL (ref 30.0–36.0)
MCV: 96.3 fL (ref 80.0–100.0)
Monocytes Absolute: 0.7 10*3/uL (ref 0.1–1.0)
Monocytes Relative: 9 %
Neutro Abs: 5.3 10*3/uL (ref 1.7–7.7)
Neutrophils Relative %: 63 %
Platelets: 161 10*3/uL (ref 150–400)
RBC: 3.55 MIL/uL — ABNORMAL LOW (ref 4.22–5.81)
RDW: 14.3 % (ref 11.5–15.5)
WBC: 8.2 10*3/uL (ref 4.0–10.5)
nRBC: 0 % (ref 0.0–0.2)

## 2021-05-29 LAB — TROPONIN I (HIGH SENSITIVITY)
Troponin I (High Sensitivity): 13 ng/L (ref <18)
Troponin I (High Sensitivity): 9 ng/L (ref <18)

## 2021-05-29 LAB — LACTIC ACID, PLASMA: Lactic Acid, Venous: 1.2 mmol/L (ref 0.5–1.9)

## 2021-05-29 MED ORDER — SODIUM CHLORIDE 0.9 % IV BOLUS
500.0000 mL | Freq: Once | INTRAVENOUS | Status: AC
  Administered 2021-05-29: 10:00:00 500 mL via INTRAVENOUS

## 2021-05-29 NOTE — ED Provider Notes (Signed)
[facility] Emergency Department Provider Note ____________________________________________   Event Date/Time   First MD Initiated Contact with Patient 05/29/21 [phone]     (approximate)  I have reviewed the triage vital signs and the nursing notes.   HISTORY  Chief Complaint Hypotension    HPI Gavin Garcia is a 87 y.o. male with PMH as noted below who presents from his nursing facility with near syncope, acute onset this morning when he was sitting in his wheelchair.  The patient states that he felt lightheaded and weak.  He was found by facility staff sitting in his chair with his head forward holding his hand on his head.  The patient did not lose consciousness.  He states he feels significantly better now.  EMS noted that he was hypotensive to the 88C systolic when they arrived although the blood pressure has since returned to normal.  He has received 400 cc of NS en route.  The patient states that he ate a good breakfast this morning and took all of his medications.  He states that he is feeling better.  He denies any chest pain, difficulty breathing, palpitations, headache, abdominal pain, nausea or vomiting, or other acute symptoms.   Past Medical History:  Diagnosis Date   Basal cell carcinoma    Glaucoma (increased eye pressure)    Osteoporosis    Parkinson's disease ([place])    Prostate enlargement     Patient Active Problem List   Diagnosis Date Noted   Widower 01/09/2020   UI (urinary incontinence) 12/06/2019   Chronic gingivitis 08/29/2019   Benign recurrent vertigo 06/16/2019   Primary insomnia 02/23/2019   Primary osteoarthritis of left knee 02/23/2019   Carotid artery disease ([place]) 02/22/2019   Resides in skilled nursing facility 02/22/2019   Aphasia 02/06/2019   Glaucoma 02/06/2019   Pressure injury of skin 02/06/2019   Expressive aphasia 02/03/2019   Sacral fracture ([place]) 04/16/2018   Parkinson disease ([place]) 04/16/2018   Spinal  stenosis of lumbar region 03/12/2018   Lower extremity weakness 03/08/2018   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 Authorizing Provider  acetaminophen (TYLENOL) 325 MG tablet Take 2 tablets (650 mg total) by mouth every 6 (six) hours as needed for mild pain (or Fever >/= 101). 03/11/18   Henreitta Leber, MD  aspirin EC 81 MG EC tablet Take 1 tablet (81 mg total) by mouth daily. 02/04/19   Demetrios Loll, MD  brimonidine [facility]) 0.2 % ophthalmic solution Place 1 drop into the left eye 2 (two) times daily. 07/09/20   [provider]  busPIRone (BUSPAR) 5 MG tablet Take 5 mg by mouth 2 (two) times daily.  03/12/20   [provider]  carbidopa-levodopa (SINEMET IR) 25-250 MG tablet Take 1 tablet by mouth 3 (three) times daily. 04/16/18   Gerlene Fee, NP  diclofenac Sodium (VOLTAREN) 1 % GEL Apply topically 4 (four) times daily.     [provider]  docusate sodium (COLACE) 100 MG capsule Take 100 mg by mouth daily as needed for mild constipation.    [provider]  dorzolamide-timolol (COSOPT) 22.3-6.8 MG/ML ophthalmic solution Place 1 drop into the left eye 2 (two) times daily. 04/16/18   Gerlene Fee, NP  doxycycline (ADOXA) 50 MG tablet 2 (two) times daily. 02/24/20   [provider]  finasteride (PROSCAR) 5 MG tablet Take 1 tablet (5 mg total) by mouth daily. 02/20/21   Vaillancourt, Aldona Bar, PA-C  Hydrocortisone Acetate 1 % CREA 4 (four) times daily as needed. 10/31/19   [provider]  latanoprost (XALATAN) 0.005 % ophthalmic solution  Place 1 drop into both eyes at bedtime. 04/16/18   Gerlene Fee, NP  magnesium hydroxide (MILK OF MAGNESIA) 400 MG/5ML suspension Take by mouth every 4 (four) hours as needed.     [provider]  meclizine (ANTIVERT) 25 MG tablet Take 25 mg by mouth 3 (three) times daily as needed.     [provider]  mirabegron ER (MYRBETRIQ) 25 MG TB24 tablet Take 1 tablet (25 mg total) by mouth daily. 02/20/21   Vaillancourt, Aldona Bar, PA-C  naproxen (NAPROSYN) 250 MG tablet Take 250 mg by mouth 2 (two) times daily with a meal.     [provider]  polyethylene glycol powder (GLYCOLAX/MIRALAX) powder Take 17 g by mouth daily.    [provider]  Polyvinyl Alcohol-Povidone PF (REFRESH) 1.4-0.6 % SOLN Apply to eye 2 (two) times daily as needed.    [provider]  rivastigmine (EXELON) 1.5 MG capsule Take 1.5 mg by mouth 2 (two) times daily.  10/07/19 10/06/20  [provider]  tamsulosin (FLOMAX) 0.4 MG CAPS capsule Take 1 capsule (0.4 mg total) by mouth daily. 02/20/21   Debroah Loop, PA-C    Allergies Codeine  No family history on file.  Social History Social History   Tobacco Use   Smoking status: Never   Smokeless tobacco: Never  Substance Use Topics   Alcohol use: No   Drug use: Never    Review of Systems  Constitutional: No fever/chills Eyes: No visual changes. ENT: No sore throat. Cardiovascular: Denies chest pain. Respiratory: Denies shortness of breath. Gastrointestinal: No nausea, no vomiting.  No diarrhea.  Genitourinary: Negative for dysuria.  Musculoskeletal: Negative for back pain. Skin: Negative for rash. Neurological: Negative for headaches, focal weakness or numbness.   ____________________________________________   PHYSICAL EXAM:  VITAL SIGNS: ED Triage Vitals  Enc Vitals Group     BP --      Pulse --      Resp --      Temp --      Temp src --      SpO2 05/29/21 0954 95 %     Weight 05/29/21 0956  183 lb 6.8 oz (83.2 kg)     Height 05/29/21 0956 5\' 11"  (1.803 m)     Head Circumference --      Peak Flow --      Pain Score 05/29/21 0956 5     Pain Loc --      Pain Edu? --      Excl. in [place]? --     Constitutional: Alert and oriented. Well appearing for age and in no acute distress. Eyes: Conjunctivae are normal.  EOMI.  PERRLA.  Head: Atraumatic. Nose: No congestion/rhinnorhea. Mouth/Throat: Mucous membranes are slightly dry. Neck: Normal range of motion.  Cardiovascular: Normal rate, regular rhythm. Grossly normal heart sounds.  Good peripheral circulation. Respiratory: Normal respiratory effort.  No retractions. Lungs CTAB. Gastrointestinal: Soft and nontender. No distention.  Genitourinary: No flank tenderness. Musculoskeletal: No  lower extremity edema.  Extremities warm and well perfused.  Neurologic:  Normal speech and language.  Motor and sensory intact in all extremities.  Normal coordination. Skin:  Skin is warm and dry. No rash noted. Psychiatric: Mood and affect are normal. Speech and behavior are normal.  ____________________________________________   LABS (all labs ordered are listed, but only abnormal results are displayed)  Labs Reviewed  BASIC METABOLIC PANEL - Abnormal; Notable for the following components:      Result Value   Glucose, Bld 113 (*)    BUN 29 (*)    Calcium 8.3 (*)    All other components within normal limits  CBC WITH DIFFERENTIAL/PLATELET - Abnormal; Notable for the following components:   RBC 3.55 (*)    Hemoglobin 11.0 (*)    HCT 34.2 (*)    All other components within normal limits  LACTIC ACID, PLASMA  URINALYSIS, ROUTINE W REFLEX MICROSCOPIC  TROPONIN I (HIGH SENSITIVITY)  TROPONIN I (HIGH SENSITIVITY)   ____________________________________________  EKG  ED ECG REPORT I, Arta Silence, the attending physician, personally viewed and interpreted this ECG.  Date: 05/29/2021 EKG Time: 0959 Rate: 50 Rhythm: normal sinus  rhythm (incorrectly read by machine as atrial fibrillation QRS Axis: normal Intervals: Incomplete RBBB ST/T Wave abnormalities: normal Narrative Interpretation: no evidence of acute ischemia  ____________________________________________  RADIOLOGY    ____________________________________________   PROCEDURES  Procedure(s) performed: No  Procedures  Critical Care performed: No ____________________________________________   INITIAL IMPRESSION / ASSESSMENT AND PLAN / ED COURSE  Pertinent labs & imaging results that were available during my care of the patient were reviewed by me and considered in my medical decision making (see chart for details).   87 year old male with PMH as noted above including Parkinson's disease presents with an episode of near syncope this morning with associated hypotension, now resolved.  He denies any significant associated symptoms.  I reviewed the past medical records in [place].  The patient was most recently admitted in 2020 with expressive aphasia thought to be due to TIA.  He has not had any ED visits or admissions since that time.  Echo in 2020 showed a normal EF.  On exam he is overall well-appearing for his age.  His vital signs are normal except for borderline bradycardia.  Physical exam is otherwise unremarkable for acute findings.  He is oriented x4, neuro exam is nonfocal.  Differential includes vasovagal near syncope, dehydration, other metabolic disturbance, UTI or other infection, or less likely cardiac cause.  We will obtain basic labs, EKG and troponin, urinalysis, lactate, give a fluid bolus, and reassess.  ----------------------------------------- 1:13 PM on 05/29/2021 -----------------------------------------  Lab work-up is unremarkable.  Troponins are negative x2.  Urinalysis is negative.  Electrolytes and lactate are normal, and there are no concerning findings on the CBC.  The patient has remained asymptomatic since arrival in  the ED.  His vital signs are stable.  His heart rate has remained in the 50s but he is asymptomatic.  Overall presentation is consistent with vasovagal near syncope, medication side effects, or other benign etiology.  I offered the patient observation admission, however he states that he feels much better and would like to go home.  I think that this is reasonable given the negative work-up.  I counseled him on the results of the work-up.  I gave him very thorough return precautions and he expressed understanding.  He is stable for discharge at this time.   ____________________________________________   FINAL CLINICAL IMPRESSION(S) /  ED DIAGNOSES  Final diagnoses:  Near syncope      NEW MEDICATIONS STARTED DURING THIS VISIT:  New Prescriptions   No medications on file     Note:  This document was prepared using Dragon voice recognition software and may include unintentional dictation errors.    Arta Silence, MD 05/29/21 1315

## 2021-05-29 NOTE — ED Notes (Signed)
[name], daughter aware of patient's discharge. Daughter unable to take patient home herself.

## 2021-05-29 NOTE — ED Triage Notes (Signed)
Patient to ED from [facility] via [facility] for dizziness/ hypotension. BP was intially 80/48 with EMS and got up to 116/62 after 427mL of NaCL. Patient is Aox4 at this time.

## 2021-05-29 NOTE — Discharge Instructions (Signed)
Return to the ER for new, worsening, or persistent severe dizziness or lightheadedness, recurrent episodes of feeling like you might pass out, chest pain, difficulty breathing, or any other new or worsening symptoms that concern you.

## 2021-07-01 DIAGNOSIS — H401112 Primary open-angle glaucoma, right eye, moderate stage: Secondary | ICD-10-CM | POA: Diagnosis not present

## 2021-07-24 DIAGNOSIS — I7091 Generalized atherosclerosis: Secondary | ICD-10-CM | POA: Diagnosis not present

## 2021-07-24 DIAGNOSIS — L603 Nail dystrophy: Secondary | ICD-10-CM | POA: Diagnosis not present

## 2021-09-03 DIAGNOSIS — F0280 Dementia in other diseases classified elsewhere without behavioral disturbance: Secondary | ICD-10-CM | POA: Diagnosis not present

## 2021-09-03 DIAGNOSIS — Z789 Other specified health status: Secondary | ICD-10-CM | POA: Diagnosis not present

## 2021-09-03 DIAGNOSIS — M48061 Spinal stenosis, lumbar region without neurogenic claudication: Secondary | ICD-10-CM | POA: Diagnosis not present

## 2021-09-03 DIAGNOSIS — I10 Essential (primary) hypertension: Secondary | ICD-10-CM | POA: Diagnosis not present

## 2021-09-03 DIAGNOSIS — G20 Parkinson's disease: Secondary | ICD-10-CM | POA: Diagnosis not present

## 2021-09-10 DIAGNOSIS — U071 COVID-19: Secondary | ICD-10-CM | POA: Diagnosis not present

## 2021-09-24 DIAGNOSIS — L603 Nail dystrophy: Secondary | ICD-10-CM | POA: Diagnosis not present

## 2021-09-24 DIAGNOSIS — I7091 Generalized atherosclerosis: Secondary | ICD-10-CM | POA: Diagnosis not present

## 2021-09-30 DIAGNOSIS — N390 Urinary tract infection, site not specified: Secondary | ICD-10-CM | POA: Diagnosis not present

## 2021-09-30 DIAGNOSIS — R410 Disorientation, unspecified: Secondary | ICD-10-CM | POA: Diagnosis not present

## 2021-10-10 DIAGNOSIS — Z789 Other specified health status: Secondary | ICD-10-CM | POA: Diagnosis not present

## 2021-10-10 DIAGNOSIS — I10 Essential (primary) hypertension: Secondary | ICD-10-CM | POA: Diagnosis not present

## 2021-10-10 DIAGNOSIS — G20 Parkinson's disease: Secondary | ICD-10-CM | POA: Diagnosis not present

## 2021-10-10 DIAGNOSIS — I779 Disorder of arteries and arterioles, unspecified: Secondary | ICD-10-CM | POA: Diagnosis not present

## 2021-10-10 DIAGNOSIS — F0280 Dementia in other diseases classified elsewhere without behavioral disturbance: Secondary | ICD-10-CM | POA: Diagnosis not present

## 2021-10-10 DIAGNOSIS — T466X5A Adverse effect of antihyperlipidemic and antiarteriosclerotic drugs, initial encounter: Secondary | ICD-10-CM | POA: Diagnosis not present

## 2021-10-10 DIAGNOSIS — M791 Myalgia, unspecified site: Secondary | ICD-10-CM | POA: Diagnosis not present

## 2021-10-28 DIAGNOSIS — L814 Other melanin hyperpigmentation: Secondary | ICD-10-CM | POA: Diagnosis not present

## 2021-10-28 DIAGNOSIS — L821 Other seborrheic keratosis: Secondary | ICD-10-CM | POA: Diagnosis not present

## 2021-10-28 DIAGNOSIS — L905 Scar conditions and fibrosis of skin: Secondary | ICD-10-CM | POA: Diagnosis not present

## 2021-10-28 DIAGNOSIS — L570 Actinic keratosis: Secondary | ICD-10-CM | POA: Diagnosis not present

## 2021-11-08 DIAGNOSIS — N390 Urinary tract infection, site not specified: Secondary | ICD-10-CM | POA: Diagnosis not present

## 2021-11-08 DIAGNOSIS — R410 Disorientation, unspecified: Secondary | ICD-10-CM | POA: Diagnosis not present

## 2021-11-08 DIAGNOSIS — R32 Unspecified urinary incontinence: Secondary | ICD-10-CM | POA: Diagnosis not present

## 2021-11-12 DIAGNOSIS — M1712 Unilateral primary osteoarthritis, left knee: Secondary | ICD-10-CM | POA: Diagnosis not present

## 2021-11-12 DIAGNOSIS — F0280 Dementia in other diseases classified elsewhere without behavioral disturbance: Secondary | ICD-10-CM | POA: Diagnosis not present

## 2021-11-12 DIAGNOSIS — I779 Disorder of arteries and arterioles, unspecified: Secondary | ICD-10-CM | POA: Diagnosis not present

## 2021-12-09 DIAGNOSIS — M791 Myalgia, unspecified site: Secondary | ICD-10-CM | POA: Diagnosis not present

## 2021-12-09 DIAGNOSIS — G20 Parkinson's disease: Secondary | ICD-10-CM | POA: Diagnosis not present

## 2021-12-09 DIAGNOSIS — Z789 Other specified health status: Secondary | ICD-10-CM | POA: Diagnosis not present

## 2021-12-09 DIAGNOSIS — T466X5A Adverse effect of antihyperlipidemic and antiarteriosclerotic drugs, initial encounter: Secondary | ICD-10-CM | POA: Diagnosis not present

## 2021-12-09 DIAGNOSIS — F0280 Dementia in other diseases classified elsewhere without behavioral disturbance: Secondary | ICD-10-CM | POA: Diagnosis not present

## 2021-12-09 DIAGNOSIS — I779 Disorder of arteries and arterioles, unspecified: Secondary | ICD-10-CM | POA: Diagnosis not present

## 2022-01-01 DIAGNOSIS — L729 Follicular cyst of the skin and subcutaneous tissue, unspecified: Secondary | ICD-10-CM | POA: Diagnosis not present

## 2022-01-01 DIAGNOSIS — L570 Actinic keratosis: Secondary | ICD-10-CM | POA: Diagnosis not present

## 2022-01-01 DIAGNOSIS — L821 Other seborrheic keratosis: Secondary | ICD-10-CM | POA: Diagnosis not present

## 2022-01-01 DIAGNOSIS — R32 Unspecified urinary incontinence: Secondary | ICD-10-CM | POA: Diagnosis not present

## 2022-01-01 DIAGNOSIS — L814 Other melanin hyperpigmentation: Secondary | ICD-10-CM | POA: Diagnosis not present

## 2022-01-09 DIAGNOSIS — T466X5A Adverse effect of antihyperlipidemic and antiarteriosclerotic drugs, initial encounter: Secondary | ICD-10-CM | POA: Diagnosis not present

## 2022-01-09 DIAGNOSIS — F0280 Dementia in other diseases classified elsewhere without behavioral disturbance: Secondary | ICD-10-CM | POA: Diagnosis not present

## 2022-01-09 DIAGNOSIS — I779 Disorder of arteries and arterioles, unspecified: Secondary | ICD-10-CM | POA: Diagnosis not present

## 2022-01-09 DIAGNOSIS — Z789 Other specified health status: Secondary | ICD-10-CM | POA: Diagnosis not present

## 2022-01-09 DIAGNOSIS — Z0000 Encounter for general adult medical examination without abnormal findings: Secondary | ICD-10-CM | POA: Diagnosis not present

## 2022-01-09 DIAGNOSIS — M48061 Spinal stenosis, lumbar region without neurogenic claudication: Secondary | ICD-10-CM | POA: Diagnosis not present

## 2022-01-09 DIAGNOSIS — M791 Myalgia, unspecified site: Secondary | ICD-10-CM | POA: Diagnosis not present

## 2022-01-09 DIAGNOSIS — G20 Parkinson's disease: Secondary | ICD-10-CM | POA: Diagnosis not present

## 2022-01-20 DIAGNOSIS — R32 Unspecified urinary incontinence: Secondary | ICD-10-CM | POA: Diagnosis not present

## 2022-02-13 DIAGNOSIS — I7091 Generalized atherosclerosis: Secondary | ICD-10-CM | POA: Diagnosis not present

## 2022-02-13 DIAGNOSIS — B351 Tinea unguium: Secondary | ICD-10-CM | POA: Diagnosis not present

## 2022-02-20 ENCOUNTER — Ambulatory Visit: Payer: Medicare HMO | Admitting: Physician Assistant

## 2022-02-24 ENCOUNTER — Encounter: Payer: Self-pay | Admitting: Physician Assistant

## 2022-03-04 DIAGNOSIS — G20 Parkinson's disease: Secondary | ICD-10-CM | POA: Diagnosis not present

## 2022-03-04 DIAGNOSIS — T466X5A Adverse effect of antihyperlipidemic and antiarteriosclerotic drugs, initial encounter: Secondary | ICD-10-CM | POA: Diagnosis not present

## 2022-03-04 DIAGNOSIS — F0280 Dementia in other diseases classified elsewhere without behavioral disturbance: Secondary | ICD-10-CM | POA: Diagnosis not present

## 2022-03-04 DIAGNOSIS — M791 Myalgia, unspecified site: Secondary | ICD-10-CM | POA: Diagnosis not present

## 2022-03-04 DIAGNOSIS — Z789 Other specified health status: Secondary | ICD-10-CM | POA: Diagnosis not present

## 2022-03-04 DIAGNOSIS — I779 Disorder of arteries and arterioles, unspecified: Secondary | ICD-10-CM | POA: Diagnosis not present

## 2022-03-27 DIAGNOSIS — R32 Unspecified urinary incontinence: Secondary | ICD-10-CM | POA: Diagnosis not present

## 2022-04-03 DIAGNOSIS — I779 Disorder of arteries and arterioles, unspecified: Secondary | ICD-10-CM | POA: Diagnosis not present

## 2022-04-03 DIAGNOSIS — G20A1 Parkinson's disease without dyskinesia, without mention of fluctuations: Secondary | ICD-10-CM | POA: Diagnosis not present

## 2022-04-03 DIAGNOSIS — Z789 Other specified health status: Secondary | ICD-10-CM | POA: Diagnosis not present

## 2022-04-03 DIAGNOSIS — F0280 Dementia in other diseases classified elsewhere without behavioral disturbance: Secondary | ICD-10-CM | POA: Diagnosis not present

## 2022-04-03 DIAGNOSIS — I10 Essential (primary) hypertension: Secondary | ICD-10-CM | POA: Diagnosis not present

## 2022-04-28 DIAGNOSIS — L821 Other seborrheic keratosis: Secondary | ICD-10-CM | POA: Diagnosis not present

## 2022-04-28 DIAGNOSIS — L814 Other melanin hyperpigmentation: Secondary | ICD-10-CM | POA: Diagnosis not present

## 2022-04-28 DIAGNOSIS — L570 Actinic keratosis: Secondary | ICD-10-CM | POA: Diagnosis not present

## 2022-04-28 DIAGNOSIS — L820 Inflamed seborrheic keratosis: Secondary | ICD-10-CM | POA: Diagnosis not present

## 2022-05-13 DIAGNOSIS — H401134 Primary open-angle glaucoma, bilateral, indeterminate stage: Secondary | ICD-10-CM | POA: Diagnosis not present

## 2022-05-13 DIAGNOSIS — H532 Diplopia: Secondary | ICD-10-CM | POA: Diagnosis not present

## 2022-05-13 DIAGNOSIS — Z961 Presence of intraocular lens: Secondary | ICD-10-CM | POA: Diagnosis not present

## 2022-05-20 DIAGNOSIS — I10 Essential (primary) hypertension: Secondary | ICD-10-CM | POA: Diagnosis not present

## 2022-05-20 DIAGNOSIS — G20A1 Parkinson's disease without dyskinesia, without mention of fluctuations: Secondary | ICD-10-CM | POA: Diagnosis not present

## 2022-05-20 DIAGNOSIS — F0280 Dementia in other diseases classified elsewhere without behavioral disturbance: Secondary | ICD-10-CM | POA: Diagnosis not present

## 2022-05-20 DIAGNOSIS — I779 Disorder of arteries and arterioles, unspecified: Secondary | ICD-10-CM | POA: Diagnosis not present

## 2022-05-22 DIAGNOSIS — I7091 Generalized atherosclerosis: Secondary | ICD-10-CM | POA: Diagnosis not present

## 2022-05-22 DIAGNOSIS — B351 Tinea unguium: Secondary | ICD-10-CM | POA: Diagnosis not present

## 2022-07-01 DIAGNOSIS — I779 Disorder of arteries and arterioles, unspecified: Secondary | ICD-10-CM | POA: Diagnosis not present

## 2022-07-01 DIAGNOSIS — M791 Myalgia, unspecified site: Secondary | ICD-10-CM | POA: Diagnosis not present

## 2022-07-01 DIAGNOSIS — Z789 Other specified health status: Secondary | ICD-10-CM | POA: Diagnosis not present

## 2022-07-01 DIAGNOSIS — F0280 Dementia in other diseases classified elsewhere without behavioral disturbance: Secondary | ICD-10-CM | POA: Diagnosis not present

## 2022-07-01 DIAGNOSIS — Z0000 Encounter for general adult medical examination without abnormal findings: Secondary | ICD-10-CM | POA: Diagnosis not present

## 2022-07-01 DIAGNOSIS — T466X5A Adverse effect of antihyperlipidemic and antiarteriosclerotic drugs, initial encounter: Secondary | ICD-10-CM | POA: Diagnosis not present

## 2022-07-01 DIAGNOSIS — I10 Essential (primary) hypertension: Secondary | ICD-10-CM | POA: Diagnosis not present

## 2022-07-01 DIAGNOSIS — G20A1 Parkinson's disease without dyskinesia, without mention of fluctuations: Secondary | ICD-10-CM | POA: Diagnosis not present

## 2022-07-03 DIAGNOSIS — R32 Unspecified urinary incontinence: Secondary | ICD-10-CM | POA: Diagnosis not present

## 2022-07-15 DIAGNOSIS — R21 Rash and other nonspecific skin eruption: Secondary | ICD-10-CM | POA: Diagnosis not present

## 2022-07-30 DIAGNOSIS — B351 Tinea unguium: Secondary | ICD-10-CM | POA: Diagnosis not present

## 2022-07-30 DIAGNOSIS — I7091 Generalized atherosclerosis: Secondary | ICD-10-CM | POA: Diagnosis not present

## 2022-08-19 DIAGNOSIS — G20A1 Parkinson's disease without dyskinesia, without mention of fluctuations: Secondary | ICD-10-CM | POA: Diagnosis not present

## 2022-08-19 DIAGNOSIS — Z789 Other specified health status: Secondary | ICD-10-CM | POA: Diagnosis not present

## 2022-08-19 DIAGNOSIS — M791 Myalgia, unspecified site: Secondary | ICD-10-CM | POA: Diagnosis not present

## 2022-08-19 DIAGNOSIS — T466X5A Adverse effect of antihyperlipidemic and antiarteriosclerotic drugs, initial encounter: Secondary | ICD-10-CM | POA: Diagnosis not present

## 2022-08-19 DIAGNOSIS — I779 Disorder of arteries and arterioles, unspecified: Secondary | ICD-10-CM | POA: Diagnosis not present

## 2022-08-19 DIAGNOSIS — F0280 Dementia in other diseases classified elsewhere without behavioral disturbance: Secondary | ICD-10-CM | POA: Diagnosis not present

## 2022-09-01 ENCOUNTER — Telehealth: Payer: Self-pay | Admitting: *Deleted

## 2022-09-01 NOTE — Telephone Encounter (Signed)
Received fax from [facility] asking for cath order to be signed. Pt last seen 02/20/2021, per Sam pt would need to be seen. Called and left message, also looking thru patient's chart he is now in skilled nursing. I will cancel order or cath and send back to [facility].

## 2022-09-09 DIAGNOSIS — H0100A Unspecified blepharitis right eye, upper and lower eyelids: Secondary | ICD-10-CM | POA: Diagnosis not present

## 2022-09-09 DIAGNOSIS — H401134 Primary open-angle glaucoma, bilateral, indeterminate stage: Secondary | ICD-10-CM | POA: Diagnosis not present

## 2022-09-09 DIAGNOSIS — H524 Presbyopia: Secondary | ICD-10-CM | POA: Diagnosis not present

## 2022-09-09 DIAGNOSIS — H0100B Unspecified blepharitis left eye, upper and lower eyelids: Secondary | ICD-10-CM | POA: Diagnosis not present

## 2022-09-22 DIAGNOSIS — M791 Myalgia, unspecified site: Secondary | ICD-10-CM | POA: Diagnosis not present

## 2022-09-22 DIAGNOSIS — F0280 Dementia in other diseases classified elsewhere without behavioral disturbance: Secondary | ICD-10-CM | POA: Diagnosis not present

## 2022-09-22 DIAGNOSIS — I10 Essential (primary) hypertension: Secondary | ICD-10-CM | POA: Diagnosis not present

## 2022-09-22 DIAGNOSIS — T466X5A Adverse effect of antihyperlipidemic and antiarteriosclerotic drugs, initial encounter: Secondary | ICD-10-CM | POA: Diagnosis not present

## 2022-09-22 DIAGNOSIS — I779 Disorder of arteries and arterioles, unspecified: Secondary | ICD-10-CM | POA: Diagnosis not present

## 2022-09-22 DIAGNOSIS — G20A1 Parkinson's disease without dyskinesia, without mention of fluctuations: Secondary | ICD-10-CM | POA: Diagnosis not present

## 2022-09-22 DIAGNOSIS — Z789 Other specified health status: Secondary | ICD-10-CM | POA: Diagnosis not present

## 2022-10-09 NOTE — Telephone Encounter (Signed)
Spoke with daughter and pt does live in SNF, she will ask if they can start writing orders for catheters.

## 2022-10-28 DIAGNOSIS — B351 Tinea unguium: Secondary | ICD-10-CM | POA: Diagnosis not present

## 2022-10-28 DIAGNOSIS — I7091 Generalized atherosclerosis: Secondary | ICD-10-CM | POA: Diagnosis not present

## 2022-10-30 DIAGNOSIS — I10 Essential (primary) hypertension: Secondary | ICD-10-CM | POA: Diagnosis not present

## 2022-10-30 DIAGNOSIS — F0280 Dementia in other diseases classified elsewhere without behavioral disturbance: Secondary | ICD-10-CM | POA: Diagnosis not present

## 2022-10-30 DIAGNOSIS — I779 Disorder of arteries and arterioles, unspecified: Secondary | ICD-10-CM | POA: Diagnosis not present

## 2022-10-30 DIAGNOSIS — Z789 Other specified health status: Secondary | ICD-10-CM | POA: Diagnosis not present

## 2022-10-30 DIAGNOSIS — M48061 Spinal stenosis, lumbar region without neurogenic claudication: Secondary | ICD-10-CM | POA: Diagnosis not present

## 2022-10-30 DIAGNOSIS — G20A1 Parkinson's disease without dyskinesia, without mention of fluctuations: Secondary | ICD-10-CM | POA: Diagnosis not present

## 2022-11-17 DIAGNOSIS — F0280 Dementia in other diseases classified elsewhere without behavioral disturbance: Secondary | ICD-10-CM | POA: Diagnosis not present

## 2022-11-17 DIAGNOSIS — G20A1 Parkinson's disease without dyskinesia, without mention of fluctuations: Secondary | ICD-10-CM | POA: Diagnosis not present

## 2023-01-05 DIAGNOSIS — G20A1 Parkinson's disease without dyskinesia, without mention of fluctuations: Secondary | ICD-10-CM | POA: Diagnosis not present

## 2023-01-05 DIAGNOSIS — I779 Disorder of arteries and arterioles, unspecified: Secondary | ICD-10-CM | POA: Diagnosis not present

## 2023-01-05 DIAGNOSIS — Z789 Other specified health status: Secondary | ICD-10-CM | POA: Diagnosis not present

## 2023-01-05 DIAGNOSIS — F0280 Dementia in other diseases classified elsewhere without behavioral disturbance: Secondary | ICD-10-CM | POA: Diagnosis not present

## 2023-01-12 ENCOUNTER — Emergency Department
Admission: EM | Admit: 2023-01-12 | Discharge: 2023-01-12 | Disposition: A | Discharge status: Skilled Nursing Facility | Payer: Medicare HMO | Attending: Emergency Medicine | Admitting: Emergency Medicine

## 2023-01-12 ENCOUNTER — Emergency Department: Payer: Medicare HMO

## 2023-01-12 ENCOUNTER — Other Ambulatory Visit: Payer: Self-pay

## 2023-01-12 DIAGNOSIS — W1830XA Fall on same level, unspecified, initial encounter: Secondary | ICD-10-CM | POA: Diagnosis not present

## 2023-01-12 DIAGNOSIS — F0390 Unspecified dementia without behavioral disturbance: Secondary | ICD-10-CM | POA: Diagnosis not present

## 2023-01-12 DIAGNOSIS — R410 Disorientation, unspecified: Secondary | ICD-10-CM | POA: Diagnosis not present

## 2023-01-12 DIAGNOSIS — S0990XA Unspecified injury of head, initial encounter: Secondary | ICD-10-CM

## 2023-01-12 DIAGNOSIS — S22020A Wedge compression fracture of second thoracic vertebra, initial encounter for closed fracture: Secondary | ICD-10-CM

## 2023-01-12 DIAGNOSIS — W06XXXA Fall from bed, initial encounter: Secondary | ICD-10-CM | POA: Insufficient documentation

## 2023-01-12 DIAGNOSIS — S0101XA Laceration without foreign body of scalp, initial encounter: Secondary | ICD-10-CM | POA: Diagnosis not present

## 2023-01-12 DIAGNOSIS — M4854XA Collapsed vertebra, not elsewhere classified, thoracic region, initial encounter for fracture: Secondary | ICD-10-CM | POA: Diagnosis not present

## 2023-01-12 DIAGNOSIS — R519 Headache, unspecified: Secondary | ICD-10-CM | POA: Insufficient documentation

## 2023-01-12 DIAGNOSIS — N3001 Acute cystitis with hematuria: Secondary | ICD-10-CM | POA: Diagnosis not present

## 2023-01-12 DIAGNOSIS — R4182 Altered mental status, unspecified: Secondary | ICD-10-CM | POA: Insufficient documentation

## 2023-01-12 DIAGNOSIS — G20A1 Parkinson's disease without dyskinesia, without mention of fluctuations: Secondary | ICD-10-CM | POA: Diagnosis not present

## 2023-01-12 DIAGNOSIS — W19XXXA Unspecified fall, initial encounter: Secondary | ICD-10-CM

## 2023-01-12 DIAGNOSIS — Z743 Need for continuous supervision: Secondary | ICD-10-CM | POA: Diagnosis not present

## 2023-01-12 DIAGNOSIS — I6521 Occlusion and stenosis of right carotid artery: Secondary | ICD-10-CM | POA: Diagnosis not present

## 2023-01-12 DIAGNOSIS — M542 Cervicalgia: Secondary | ICD-10-CM | POA: Diagnosis not present

## 2023-01-12 DIAGNOSIS — M8580 Other specified disorders of bone density and structure, unspecified site: Secondary | ICD-10-CM | POA: Diagnosis not present

## 2023-01-12 DIAGNOSIS — N390 Urinary tract infection, site not specified: Secondary | ICD-10-CM | POA: Diagnosis not present

## 2023-01-12 DIAGNOSIS — R58 Hemorrhage, not elsewhere classified: Secondary | ICD-10-CM | POA: Diagnosis not present

## 2023-01-12 DIAGNOSIS — Z66 Do not resuscitate: Secondary | ICD-10-CM | POA: Insufficient documentation

## 2023-01-12 DIAGNOSIS — M4312 Spondylolisthesis, cervical region: Secondary | ICD-10-CM | POA: Diagnosis not present

## 2023-01-12 DIAGNOSIS — I10 Essential (primary) hypertension: Secondary | ICD-10-CM | POA: Diagnosis not present

## 2023-01-12 LAB — URINALYSIS, ROUTINE W REFLEX MICROSCOPIC
Bilirubin Urine: NEGATIVE
Glucose, UA: NEGATIVE mg/dL
Ketones, ur: NEGATIVE mg/dL
Nitrite: POSITIVE — AB
Protein, ur: 30 mg/dL — AB
Specific Gravity, Urine: 1.01 (ref 1.005–1.030)
Squamous Epithelial / HPF: NONE SEEN /HPF (ref 0–5)
WBC, UA: >50 WBC/hpf (ref 0–5)
pH: 6 (ref 5.0–8.0)

## 2023-01-12 LAB — BASIC METABOLIC PANEL
Anion gap: 7 (ref 5–15)
BUN: 23 mg/dL (ref 8–23)
CO2: 23 mmol/L (ref 22–32)
Calcium: 8 mg/dL — ABNORMAL LOW (ref 8.9–10.3)
Chloride: 110 mmol/L (ref 98–111)
Creatinine, Ser: 0.75 mg/dL (ref 0.61–1.24)
GFR, Estimated: >60 mL/min (ref >60)
Glucose, Bld: 102 mg/dL — ABNORMAL HIGH (ref 70–99)
Potassium: 3.6 mmol/L (ref 3.5–5.1)
Sodium: 140 mmol/L (ref 135–145)

## 2023-01-12 LAB — CBC WITH DIFFERENTIAL/PLATELET
Abs Immature Granulocytes: 0.04 10*3/uL (ref 0.00–0.07)
Basophils Absolute: 0 10*3/uL (ref 0.0–0.1)
Basophils Relative: 0 %
Eosinophils Absolute: 0.1 10*3/uL (ref 0.0–0.5)
Eosinophils Relative: 1 %
HCT: 35.4 % — ABNORMAL LOW (ref 39.0–52.0)
Hemoglobin: 11.3 g/dL — ABNORMAL LOW (ref 13.0–17.0)
Immature Granulocytes: 1 %
Lymphocytes Relative: 22 %
Lymphs Abs: 1.9 10*3/uL (ref 0.7–4.0)
MCH: 30.8 pg (ref 26.0–34.0)
MCHC: 31.9 g/dL (ref 30.0–36.0)
MCV: 96.5 fL (ref 80.0–100.0)
Monocytes Absolute: 0.8 10*3/uL (ref 0.1–1.0)
Monocytes Relative: 9 %
Neutro Abs: 5.8 10*3/uL (ref 1.7–7.7)
Neutrophils Relative %: 67 %
Platelets: 148 10*3/uL — ABNORMAL LOW (ref 150–400)
RBC: 3.67 MIL/uL — ABNORMAL LOW (ref 4.22–5.81)
RDW: 14.1 % (ref 11.5–15.5)
WBC: 8.6 10*3/uL (ref 4.0–10.5)
nRBC: 0 % (ref 0.0–0.2)

## 2023-01-12 MED ORDER — BACITRACIN ZINC 500 UNIT/GM EX OINT
TOPICAL_OINTMENT | CUTANEOUS | Status: AC
  Filled 2023-01-12: qty 0.9

## 2023-01-12 MED ORDER — CEFTRIAXONE SODIUM 1 G IJ SOLR
1.0000 g | Freq: Once | INTRAMUSCULAR | Status: AC
  Administered 2023-01-12: 1 g via INTRAVENOUS
  Filled 2023-01-12: qty 10

## 2023-01-12 NOTE — ED Triage Notes (Signed)
Pt from [facility] ALF via EMS for unwitnessed fall.  Pt seen at 0300 in bed, found on floor at approximately 0350.  EMS reports pt was fully oriented in transit, pt is currently to person, place, and time.  When asked about his fall patient states "I was getting ready to work under my house and was getting a piece of equipment out."  Pt has hematoma on back of head.  EMS reports pt is (-) for blood thinners, (-) for LOC.

## 2023-01-12 NOTE — ED Notes (Signed)
Patient transported to CT 

## 2023-01-12 NOTE — Assessment & Plan Note (Signed)
Acute to subacute fracture seen on imaging.  Unclear if this is due to most recent fall last night or if this is due to previous falls.  Patient's daughter states he would not want any aggressive intervention and focus on comfort measures at this time.  - Pain control as needed

## 2023-01-12 NOTE — Consult Note (Signed)
Initial Consultation Note   Patient: Gavin Garcia XLK:[id number] DOB: [birth year] PCP: Lauro Regulus, MD DOA: 01/12/2023 DOS: the patient was seen and examined on 01/12/2023 Primary service: No att. providers found  Referring physician: Dr. Derrill Kay Reason for consult: Altered mental status  Assessment/Plan: Assessment and Plan:  Ground-level fall Patient had a ground-level fall where he rolled off his bed.  Due to underlying dementia, patient frequently forgets that he is no longer able to ambulate.  At patient's SNF, they have not been utilizing his bed guardrails and keeping his door closed.  I consulted with case manager who has discussed with the SNF that he will need all protocols in place to avoid any further falls from his bed.  At this time, no hospital needs noted.  Altered mental status Patient has a history of underlying dementia due to Parkinson's disease.  Patient's daughter was concerned that patient was becoming more altered since arriving in the ER.  I suspect this is multifactorial in the setting of hospital induced delirium given patient has been in the ER hallway for now almost 12 hours.  In addition, patient's underlying UTI could be contributing as well, however patient's MOST form provided by patient's daughter indicates he would not want antibiotics.   - Discussed with daughter that the best course is to get patient back to his home environment especially since we are not continuing any antibiotics for him at this time.  Compression fracture of T2 vertebra (HCC) Acute to subacute fracture seen on imaging.  Unclear if this is due to most recent fall last night or if this is due to previous falls.  Patient's daughter states he would not want any aggressive intervention and focus on comfort measures at this time.  - Pain control as needed  Urinary tract infection Patient endorses dysuria, with abdominal pain noted on examination.  Urinalysis consistent with UTI.   Per patient's MOST form, he would not want any antibiotics.   [facility] will sign off at present, please call us again when needed.  HPI: DEWIE KOEPKE is a 89 y.o. male with past medical history of progressive dementia 2/2 Parkinson's Disease, HTN, chronic immobilization 2/2 lumbar stenosis, multiple falls, carotid artery disease, who presents to the ED due to ground-level fall.  History obtained from patient's daughter at bedside due to patient's underlying dementia.  Vernona Rieger states that patient has been gradually declining over the last 1 year with worsening memory, visual hallucinations, and decreased mobility.  She notes that he has been wheelchair-bound for 4 years, but over the last 1 year, he has been not even able to transfer himself and his facility has had to get a [employer] lift.  He has had several falls in the last couple weeks with the most recent last night.  Nursing staff last checked on him at 3 AM on 7/29 and then found him on the ground at 5:30 AM.  The plan was initially for patient to go home back to his nursing facility, however he seemed to become more altered since being in the ER.  Due to this, she asked for reevaluation.  When I talked to Mr. Oneil, he is aware that he is here due to a fall.  He denies any headache but endorses neck pain.  He denies any chest pain, shortness of breath, abdominal pain.  He endorses dysuria that is intermittent.  Unable to offer further details.  ED course: On arrival to the ED, patient was hypertensive at 167/99 with  heart rate of 80.  He was saturating at 98% on room air.  He was afebrile 98.Initial workup notable for CBC with hemoglobin of 11.3, platelets of 148.  Renal function within normal limits with GFR above 60.  Urinalysis with large leukocytes, positive nitrites, and many bacteria.  CT head was obtained that demonstrated posterior scalp hematoma and laceration but no skull fracture or intracranial abnormality.  C-spine with acute to subacute  T2 compression fracture with 20% loss.  Patient started on Rocephin and [facility] contacted for consideration of admission.  Review of Systems: As mentioned in the history of present illness. All other systems reviewed and are negative.  Past Medical History:  Diagnosis Date   Basal cell carcinoma    Glaucoma (increased eye pressure)    Osteoporosis    Parkinson's disease    Prostate enlargement    Past Surgical History:  Procedure Laterality Date   NO PAST SURGERIES     Social History:  reports that he has never smoked. He has never used smokeless tobacco. He reports that he does not drink alcohol and does not use drugs.  Allergies  Allergen Reactions   Codeine Nausea And Vomiting    History reviewed. No pertinent family history.  Prior to Admission medications   Medication Sig Start Date End Date Taking? Authorizing Provider  acetaminophen (TYLENOL) 325 MG tablet Take 2 tablets (650 mg total) by mouth every 6 (six) hours as needed for mild pain (or Fever >/= 101). 03/11/18   Houston Siren, MD  aspirin EC 81 MG EC tablet Take 1 tablet (81 mg total) by mouth daily. 02/04/19   Shaune Pollack, MD  brimonidine [facility]) 0.2 % ophthalmic solution Place 1 drop into the left eye 2 (two) times daily. 07/09/20   [provider]  busPIRone (BUSPAR) 5 MG tablet Take 5 mg by mouth 2 (two) times daily.  03/12/20   [provider]  carbidopa-levodopa (SINEMET IR) 25-250 MG tablet Take 1 tablet by mouth 3 (three) times daily. 04/16/18   Sharee Holster, NP  diclofenac Sodium (VOLTAREN) 1 % GEL Apply topically 4 (four) times daily.     [provider]  docusate sodium (COLACE) 100 MG capsule Take 100 mg by mouth daily as needed for mild constipation.    [provider]  dorzolamide-timolol (COSOPT) 22.3-6.8 MG/ML ophthalmic solution Place 1 drop into the left eye 2 (two) times daily. 04/16/18   Sharee Holster, NP  doxycycline (ADOXA) 50 MG tablet 2 (two) times daily.  02/24/20   [provider]  finasteride (PROSCAR) 5 MG tablet Take 1 tablet (5 mg total) by mouth daily. 02/20/21   Vaillancourt, Lelon Mast, PA-C  Hydrocortisone Acetate 1 % CREA 4 (four) times daily as needed. 10/31/19   [provider]  latanoprost (XALATAN) 0.005 % ophthalmic solution Place 1 drop into both eyes at bedtime. 04/16/18   Sharee Holster, NP  magnesium hydroxide (MILK OF MAGNESIA) 400 MG/5ML suspension Take by mouth every 4 (four) hours as needed.     [provider]  meclizine (ANTIVERT) 25 MG tablet Take 25 mg by mouth 3 (three) times daily as needed.     [provider]  mirabegron ER (MYRBETRIQ) 25 MG TB24 tablet Take 1 tablet (25 mg total) by mouth daily. 02/20/21   Vaillancourt, Lelon Mast, PA-C  naproxen (NAPROSYN) 250 MG tablet Take 250 mg by mouth 2 (two) times daily with a meal.     [provider]  polyethylene glycol  powder (GLYCOLAX/MIRALAX) powder Take 17 g by mouth daily.    [provider]  Polyvinyl Alcohol-Povidone PF (REFRESH) 1.4-0.6 % SOLN Apply to eye 2 (two) times daily as needed.    [provider]  rivastigmine (EXELON) 1.5 MG capsule Take 1.5 mg by mouth 2 (two) times daily.  10/07/19 10/06/20  [provider]  tamsulosin (FLOMAX) 0.4 MG CAPS capsule Take 1 capsule (0.4 mg total) by mouth daily. 02/20/21   Carman Ching, PA-C    Physical Exam: Vitals:   01/12/23 0426 01/12/23 1147 01/12/23 1410 01/12/23 1630  BP:  (!) 167/99  136/88  Pulse:  80  81  Resp:  16  16  Temp:   98 F (36.7 C) 98 F (36.7 C)  TempSrc:   Axillary Axillary  SpO2:  98%  99%  Weight: 77.1 kg     Height: 5\' 5"  (1.651 m)      Physical Exam Vitals and nursing note reviewed.  Constitutional:      General: He is not in acute distress.    Appearance: He is not ill-appearing.  HENT:     Head: Normocephalic.     Comments: Hematoma noted to the left posterior occipital region with no active bleeding     Mouth/Throat:     Mouth: Mucous membranes are dry.     Pharynx: Oropharynx is clear.  Eyes:     Conjunctiva/sclera: Conjunctivae normal.     Pupils: Pupils are equal, round, and reactive to light.  Cardiovascular:     Rate and Rhythm: Normal rate and regular rhythm.     Heart sounds: No murmur heard.    No gallop.  Pulmonary:     Effort: Pulmonary effort is normal. No respiratory distress.     Breath sounds: Normal breath sounds. No wheezing or rales.  Abdominal:     General: Bowel sounds are normal. There is no distension.     Palpations: Abdomen is soft.     Tenderness: There is abdominal tenderness (Suprapubic).  Musculoskeletal:     Right lower leg: No edema.     Left lower leg: No edema.  Skin:    General: Skin is warm and dry.  Neurological:     Mental Status: He is alert.     Comments:  Alert and oriented to person and somewhat situation only 5 out of 5 strength throughout Follows instruction without difficulty No tremor noted No facial asymmetry or dysarthria  Psychiatric:        Behavior: Behavior normal.    Data Reviewed:  CBC with WBC of 8.6, hemoglobin 11.3, MCV of 96.4, platelets of 148 BMP with sodium of 140, potassium 3.6, bicarb 23, glucose 102, BUN 23, creatinine 0.75 GFR above 60 Urinalysis with small hematuria, large leukocytes, positive nitrites, many bacteria  CT Cervical Spine Wo Contrast  Result Date: 01/12/2023 CLINICAL DATA:  89 year old male status post unwitnessed fall, found down at 0350 hours. EXAM: CT CERVICAL SPINE WITHOUT CONTRAST TECHNIQUE: Multidetector CT imaging of the cervical spine was performed without intravenous contrast. Multiplanar CT image reconstructions were also generated. RADIATION DOSE REDUCTION: This exam was performed according to the departmental dose-optimization program which includes automated exposure control, adjustment of the mA and/or kV according to patient size and/or use of iterative reconstruction technique.  COMPARISON:  Head CT today.  CTA neck 02/06/2019. FINDINGS: Alignment: Chronic straightening of cervical lordosis with subtle anterolisthesis at C2-C3 and C7-T1. Maintained bilateral posterior element alignment. Skull base and vertebrae: Osteopenia. Visualized  skull base is intact. No atlanto-occipital dissociation. C1 and C2 appear chronically degenerated but intact and aligned. No acute osseous abnormality identified in the cervical spine. Soft tissues and spinal canal: No prevertebral fluid or swelling. No visible canal hematoma. Calcified right cervical carotid atherosclerosis appears increased. Other visible noncontrast neck soft tissues appears stable. Disc levels: Advanced chronic cervical spine degeneration with progressive degenerative appearing spinal ankylosis since 2020. C2 through C5 various facet and interbody ankylosis has substantially progressed since that time. Severe superimposed C1-C2 degeneration especially on the left has progressed. Bulky ligamentous hypertrophy about the odontoid has progressed. There is now associated C1-C2 level spinal stenosis suspected (series 6, image 42). Progressed vacuum disc in the unfused lower cervical spine C5-C6 and C6-C7. Possible mild spinal stenosis there also. Upper chest: A T2 superior endplate compression fracture is new since 2020, appears unhealed on series 6, image 45. About 20% loss of vertebral body height. Buckling of the anterior superior T2 endplate, but no retropulsion. Adjacent T1 and T3 vertebrae appear grossly intact. Upper lungs are clear. Noncontrast visible thoracic inlet appears negative except for a benign right shoulder/axillary lipoma partially visible on series 4, image 97. IMPRESSION: 1. Positive for acute or subacute T2 superior endplate compression fracture with about 20% loss of vertebral body height. No retropulsion or other complicating features. 2. No acute traumatic injury identified in the cervical spine. 3. Substantially  progressed degenerative cervical spinal ankylosis since 2020, with progressed adjacent segment degeneration including C1-C2. New C1-C2/cervicomedullary junction level spinal stenosis suspected. Electronically Signed   By: Odessa Fleming M.D.   On: 01/12/2023 05:22   CT Head Wo Contrast  Result Date: 01/12/2023 CLINICAL DATA:  89 year old male status post unwitnessed fall, found down at 0350 hours. EXAM: CT HEAD WITHOUT CONTRAST TECHNIQUE: Contiguous axial images were obtained from the base of the skull through the vertex without intravenous contrast. RADIATION DOSE REDUCTION: This exam was performed according to the departmental dose-optimization program which includes automated exposure control, adjustment of the mA and/or kV according to patient size and/or use of iterative reconstruction technique. COMPARISON:  Brain MRI 02/03/2019.  Head CT 02/05/2019. FINDINGS: Brain: Cerebral volume is stable since 2020. No midline shift, ventriculomegaly, mass effect, evidence of mass lesion, intracranial hemorrhage or evidence of cortically based acute infarction. Patchy and confluent bilateral cerebral white matter hypodensity is chronic and not significantly changed. Otherwise gray-white differentiation within normal limits for age. Vascular: No suspicious intracranial vascular hyperdensity. Calcified atherosclerosis at the skull base. Skull: Stable.  No skull fracture identified. Sinuses/Orbits: Visualized paranasal sinuses and mastoids are stable and well aerated. Other: Left posterior scalp hematoma/contusion on series 3, image 57. And larger area of occipital protuberance region scalp hematoma (series 3, image 23) just to the left of midline (up to 1.7 cm in thickness) with regional posttraumatic soft tissue gas. Underlying calvarium appears stable and intact. Orbits soft tissues appears stable. IMPRESSION: 1. Posterior scalp hematoma and laceration. No skull fracture identified. 2. No acute intracranial abnormality.  Stable non contrast CT appearance of the brain since 2020. Electronically Signed   By: Odessa Fleming M.D.   On: 01/12/2023 05:15    There are no new results to review at this time.   Family Communication: Patient's daughter updated at bedside. Primary team communication: Dr. Derrill Kay updated  Thank you very much for involving [place] in the care of your patient.  Author: Verdene Lennert, MD 01/12/2023 4:58 PM  For on call review [email or web address].

## 2023-01-12 NOTE — Assessment & Plan Note (Signed)
Patient had a ground-level fall where he rolled off his bed.  Due to underlying dementia, patient frequently forgets that he is no longer able to ambulate.  At patient's SNF, they have not been utilizing his bed guardrails and keeping his door closed.  I consulted with case manager who has discussed with the SNF that he will need all protocols in place to avoid any further falls from his bed.  At this time, no hospital needs noted.

## 2023-01-12 NOTE — ED Provider Notes (Signed)
[facility] Provider Note    Event Date/Time   First MD Initiated Contact with Patient 01/12/23 [phone]     (approximate)   History   Fall   HPI Gavin Garcia is a 89 y.o. male with Parkinson disease and probable parkinsonian dementia who lives at a skilled nursing facility.  He has other extensive chronic medical issues and is DNR and arrives with his paperwork.  He presents from the facility after an apparent mechanical fall.  The circumstances are somewhat unclear, but the patient is awake and alert and says that he thinks he got up from the bed and then fell down and struck the back of his head on the ground.  He is not supposed to be getting up and walking unassisted.  He does not take blood thinners.  He is reporting a mild headache and some pain in his neck but denies any other symptoms at this time.      Physical Exam   Triage Vital Signs: ED Triage Vitals  Encounter Vitals Group     BP 01/12/23 0425 (!) 168/70     Systolic BP Percentile --      Diastolic BP Percentile --      Pulse Rate 01/12/23 0425 72     Resp 01/12/23 0425 18     Temp 01/12/23 0425 98.3 F (36.8 C)     Temp Source 01/12/23 0425 Oral     SpO2 01/12/23 0425 96 %     Weight 01/12/23 0426 77.1 kg (170 lb)     Height 01/12/23 0426 1.651 m (5\' 5" )     Head Circumference --      Peak Flow --      Pain Score 01/12/23 0425 5     Pain Loc --      Pain Education --      Exclude from Growth Chart --     Most recent vital signs: Vitals:   01/12/23 0425  BP: (!) 168/70  Pulse: 72  Resp: 18  Temp: 98.3 F (36.8 C)  SpO2: 96%    General: Awake, no obvious distress.  Elderly but appears younger than chronological age.  Communicative and answers simple questions but shows some degree of inconsistency and confusion. Head:  Large posterior inferior hematoma on his scalp.  He has a laceration near the base of his skull that has a hematoma and is no longer bleeding.  See hospital  course for additional details. CV:  Good peripheral perfusion.  Resp:  Normal effort. Speaking easily and comfortably, no accessory muscle usage nor intercostal retractions.   Abd:  No distention.  Other:  No obvious injuries to extremities.   ED Results / Procedures / Treatments   Labs (all labs ordered are listed, but only abnormal results are displayed) Labs Reviewed - No data to display   RADIOLOGY I viewed and interpreted the patient's CT head and CT cervical spine.  See hospital course for details.   PROCEDURES:  Critical Care performed: No  Procedures    IMPRESSION / MDM / ASSESSMENT AND PLAN / ED COURSE  I reviewed the triage vital signs and the nursing notes.                              Differential diagnosis includes, but is not limited to, intracranial hemorrhage, fracture, dislocation, skull fracture, acute electrolyte or metabolic abnormality, infection.  Patient's presentation is most consistent  with acute presentation with potential threat to life or bodily function.  Labs/studies ordered: CT head, CT cervical spine  Interventions/Medications given:  Medications  bacitracin ointment (has no administration in time range)    (Note:  hospital course my include additional interventions and/or labs/studies not listed above.)   Patient is unable to provide much history but this may be his baseline given the history of parkinsonian symptoms.  Will evaluate with CT head and CT cervical spine and then reassess.     Clinical Course as of 01/12/23 0809  Mon Jan 12, 2023  1610 I viewed and interpreted the patient's CT head and CT cervical spine.  No evidence of intracranial hemorrhage, just a posterior hematoma.  On the cervical spine CT, there is some concern about an age-indeterminate T-spine compression fracture.  The patient's daughter is now at bedside and is his primary caregiver.  She confirmed his DNR status including minimal interventions given his  history of Parkinson's disease and other long-term issues.  She reports that he has been hallucinating over the last couple of weeks and has been steadily worsening.  He seems to be at his new baseline status now.  Given the lack of any other acute issues, she agrees with the plan for discharge back to his facility which I think is reasonable. [CF]    Clinical Course User Index [CF] Loleta Rose, MD     FINAL CLINICAL IMPRESSION(S) / ED DIAGNOSES   Final diagnoses:  Fall, initial encounter  Minor head injury, initial encounter  Laceration of scalp, initial encounter     Rx / DC Orders   ED Discharge Orders     None        Note:  This document was prepared using Dragon voice recognition software and may include unintentional dictation errors.   Loleta Rose, MD 01/12/23 [phone]

## 2023-01-12 NOTE — ED Notes (Signed)
Pt laceration cleansed. Clean dressing placed.   This RN called and gave report to RN at [facility].

## 2023-01-12 NOTE — Discharge Instructions (Signed)
Gavin Garcia evaluation was generally reassuring.  He has no life-threatening or severe injuries identified on CT scans.  The decision was made together with his daughter to not explore the scalp wound further and not to place stitches or staples; his daughter is comfortable with the plan for discharge back to his nursing facility with no additional intervention.  Please change the dressing on his scalp laceration twice a day and use a small amount of bacitracin ointment on it.  It can be cleaned normally when the patient is bathed.  Please follow-up with his regular doctor at the next available opportunity.

## 2023-01-12 NOTE — ED Notes (Signed)
[facility]   called  for  transport  to  [facility]

## 2023-01-12 NOTE — ED Notes (Signed)
[facility]  CALLED  FOR  TRANSPORT [facility]  [facility]  [place]

## 2023-01-12 NOTE — ED Provider Notes (Signed)
While awaiting transport back to living facility daughter stated that she felt he had a change in mental status.  I did order blood work and a urine.  Urine was concerning for urinary tract infection.  I did discuss with Dr. Huel Cote arrival with the hospitalist service however upon their discussion with family member it was discovered that the patient has a MOST form that stated he would not want antibiotics.  Will plan on discharging back to facility.   Phineas Semen, MD 01/12/23 [phone]

## 2023-01-12 NOTE — TOC CM/SW Note (Signed)
Cm received call from Dr. Huel Cote regarding pt daughters safety concerns at [facility] [place]. Cm talked with [facility] [phone]. Pam stated that they do not have bed alarms. Only modifications include top guard rails, fall mat, keeping doors open, and private sitters. Cm updated Dr. Huel Cote.

## 2023-01-12 NOTE — Assessment & Plan Note (Signed)
Patient endorses dysuria, with abdominal pain noted on examination.  Urinalysis consistent with UTI.  Per patient's MOST form, he would not want any antibiotics.

## 2023-01-12 NOTE — Assessment & Plan Note (Addendum)
Patient has a history of underlying dementia due to Parkinson's disease.  Patient's daughter was concerned that patient was becoming more altered since arriving in the ER.  I suspect this is multifactorial in the setting of hospital induced delirium given patient has been in the ER hallway for now almost 12 hours.  In addition, patient's underlying UTI could be contributing as well, however patient's MOST form provided by patient's daughter indicates he would not want antibiotics.   - Discussed with daughter that the best course is to get patient back to his home environment especially since we are not continuing any antibiotics for him at this time.

## 2023-01-15 DIAGNOSIS — M549 Dorsalgia, unspecified: Secondary | ICD-10-CM | POA: Diagnosis not present

## 2023-02-03 DIAGNOSIS — R4701 Aphasia: Secondary | ICD-10-CM | POA: Diagnosis not present

## 2023-02-03 DIAGNOSIS — G20A1 Parkinson's disease without dyskinesia, without mention of fluctuations: Secondary | ICD-10-CM | POA: Diagnosis not present

## 2023-02-03 DIAGNOSIS — Z87440 Personal history of urinary (tract) infections: Secondary | ICD-10-CM | POA: Diagnosis not present

## 2023-02-03 DIAGNOSIS — M8088XD Other osteoporosis with current pathological fracture, vertebra(e), subsequent encounter for fracture with routine healing: Secondary | ICD-10-CM | POA: Diagnosis not present

## 2023-02-03 DIAGNOSIS — F0282 Dementia in other diseases classified elsewhere, unspecified severity, with psychotic disturbance: Secondary | ICD-10-CM | POA: Diagnosis not present

## 2023-02-03 DIAGNOSIS — R634 Abnormal weight loss: Secondary | ICD-10-CM | POA: Diagnosis not present

## 2023-02-03 DIAGNOSIS — M62462 Contracture of muscle, left lower leg: Secondary | ICD-10-CM | POA: Diagnosis not present

## 2023-02-17 DIAGNOSIS — G20A1 Parkinson's disease without dyskinesia, without mention of fluctuations: Secondary | ICD-10-CM | POA: Diagnosis not present

## 2023-02-21 DIAGNOSIS — B351 Tinea unguium: Secondary | ICD-10-CM | POA: Diagnosis not present

## 2023-02-21 DIAGNOSIS — I7091 Generalized atherosclerosis: Secondary | ICD-10-CM | POA: Diagnosis not present

## 2023-02-24 DIAGNOSIS — F0280 Dementia in other diseases classified elsewhere without behavioral disturbance: Secondary | ICD-10-CM | POA: Diagnosis not present

## 2023-02-24 DIAGNOSIS — G20A1 Parkinson's disease without dyskinesia, without mention of fluctuations: Secondary | ICD-10-CM | POA: Diagnosis not present

## 2023-02-24 DIAGNOSIS — M549 Dorsalgia, unspecified: Secondary | ICD-10-CM | POA: Diagnosis not present

## 2023-03-17 DEATH — deceased
